# Patient Record
Sex: Female | Born: 1970 | Race: White | Hispanic: No | Marital: Married | Smoking: Never smoker
Health system: Southern US, Community
[De-identification: ages and names within clinical notes are randomized; demographics above are authoritative.]

## PROBLEM LIST (undated history)

## (undated) DIAGNOSIS — J45909 Unspecified asthma, uncomplicated: Secondary | ICD-10-CM

## (undated) DIAGNOSIS — F419 Anxiety disorder, unspecified: Secondary | ICD-10-CM

## (undated) DIAGNOSIS — T7840XA Allergy, unspecified, initial encounter: Secondary | ICD-10-CM

## (undated) DIAGNOSIS — G720 Drug-induced myopathy: Secondary | ICD-10-CM

## (undated) DIAGNOSIS — I10 Essential (primary) hypertension: Secondary | ICD-10-CM

## (undated) DIAGNOSIS — D8689 Sarcoidosis of other sites: Secondary | ICD-10-CM

## (undated) DIAGNOSIS — F32A Depression, unspecified: Secondary | ICD-10-CM

## (undated) DIAGNOSIS — M810 Age-related osteoporosis without current pathological fracture: Secondary | ICD-10-CM

## (undated) DIAGNOSIS — M1990 Unspecified osteoarthritis, unspecified site: Secondary | ICD-10-CM

## (undated) DIAGNOSIS — E039 Hypothyroidism, unspecified: Secondary | ICD-10-CM

## (undated) DIAGNOSIS — F329 Major depressive disorder, single episode, unspecified: Secondary | ICD-10-CM

## (undated) DIAGNOSIS — N189 Chronic kidney disease, unspecified: Secondary | ICD-10-CM

## (undated) DIAGNOSIS — K219 Gastro-esophageal reflux disease without esophagitis: Secondary | ICD-10-CM

## (undated) DIAGNOSIS — E034 Atrophy of thyroid (acquired): Secondary | ICD-10-CM

## (undated) DIAGNOSIS — M2550 Pain in unspecified joint: Secondary | ICD-10-CM

## (undated) DIAGNOSIS — M48061 Spinal stenosis, lumbar region without neurogenic claudication: Secondary | ICD-10-CM

## (undated) DIAGNOSIS — E782 Mixed hyperlipidemia: Secondary | ICD-10-CM

## (undated) HISTORY — DX: Atrophy of thyroid (acquired): E03.4

## (undated) HISTORY — DX: Allergy, unspecified, initial encounter: T78.40XA

## (undated) HISTORY — PX: TONSILLECTOMY: SUR1361

## (undated) HISTORY — DX: Mixed hyperlipidemia: E78.2

## (undated) HISTORY — PX: INNER EAR SURGERY: SHX679

## (undated) HISTORY — DX: Chronic kidney disease, unspecified: N18.9

## (undated) HISTORY — DX: Drug-induced myopathy: G72.0

## (undated) HISTORY — DX: Sarcoidosis of other sites: D86.89

## (undated) HISTORY — DX: Age-related osteoporosis without current pathological fracture: M81.0

## (undated) HISTORY — PX: APPENDECTOMY: SHX54

## (undated) HISTORY — DX: Essential (primary) hypertension: I10

## (undated) HISTORY — DX: Gastro-esophageal reflux disease without esophagitis: K21.9

## (undated) HISTORY — DX: Pain in unspecified joint: M25.50

---

## 1997-10-09 HISTORY — PX: ABDOMINAL HYSTERECTOMY: SHX81

## 2003-10-10 HISTORY — PX: BACK SURGERY: SHX140

## 2004-03-31 ENCOUNTER — Inpatient Hospital Stay (HOSPITAL_COMMUNITY): Admission: RE | Admit: 2004-03-31 | Discharge: 2004-04-03 | Payer: Self-pay | Admitting: Neurological Surgery

## 2004-04-26 ENCOUNTER — Encounter: Admission: RE | Admit: 2004-04-26 | Discharge: 2004-04-26 | Payer: Self-pay | Admitting: Neurological Surgery

## 2004-07-04 ENCOUNTER — Encounter: Admission: RE | Admit: 2004-07-04 | Discharge: 2004-07-04 | Payer: Self-pay | Admitting: Neurological Surgery

## 2004-09-05 ENCOUNTER — Encounter: Admission: RE | Admit: 2004-09-05 | Discharge: 2004-09-05 | Payer: Self-pay | Admitting: Neurological Surgery

## 2017-05-10 ENCOUNTER — Other Ambulatory Visit: Payer: Self-pay | Admitting: Physician Assistant

## 2017-05-10 DIAGNOSIS — M961 Postlaminectomy syndrome, not elsewhere classified: Secondary | ICD-10-CM

## 2017-05-21 ENCOUNTER — Ambulatory Visit
Admission: RE | Admit: 2017-05-21 | Discharge: 2017-05-21 | Disposition: A | Discharge status: Home / Self Care | Payer: PRIVATE HEALTH INSURANCE | Source: Ambulatory Visit | Attending: Physician Assistant | Admitting: Physician Assistant

## 2017-05-21 DIAGNOSIS — M961 Postlaminectomy syndrome, not elsewhere classified: Secondary | ICD-10-CM

## 2017-05-21 MED ORDER — ONDANSETRON HCL 4 MG/2ML IJ SOLN
4.0000 mg | Freq: Once | INTRAMUSCULAR | Status: AC
  Administered 2017-05-21: 4 mg via INTRAMUSCULAR

## 2017-05-21 MED ORDER — IOPAMIDOL (ISOVUE-M 200) INJECTION 41%
15.0000 mL | Freq: Once | INTRAMUSCULAR | Status: AC
  Administered 2017-05-21: 15 mL via INTRATHECAL

## 2017-05-21 MED ORDER — MEPERIDINE HCL 100 MG/ML IJ SOLN
75.0000 mg | Freq: Once | INTRAMUSCULAR | Status: AC
  Administered 2017-05-21: 75 mg via INTRAMUSCULAR

## 2017-05-21 MED ORDER — ONDANSETRON HCL 4 MG/2ML IJ SOLN: 4.0000 mg | Freq: Four times a day (QID) | INTRAMUSCULAR | Status: DC | PRN

## 2017-05-21 MED ORDER — DIAZEPAM 5 MG PO TABS
10.0000 mg | ORAL_TABLET | Freq: Once | ORAL | Status: AC
  Administered 2017-05-21: 10 mg via ORAL

## 2017-05-21 NOTE — Progress Notes (Signed)
Patient states she has been off Zoloft for at least the past two days.  Kortland Nichols, RN 

## 2017-05-21 NOTE — Discharge Instructions (Signed)
Myelogram Discharge Instructions  1. Go home and rest quietly for the next 24 hours.  It is important to lie flat for the next 24 hours.  Get up only to go to the restroom.  You may lie in the bed or on a couch on your back, your stomach, your left side or your right side.  You may have one pillow under your head.  You may have pillows between your knees while you are on your side or under your knees while you are on your back.  2. DO NOT drive today.  Recline the seat as far back as it will go, while still wearing your seat belt, on the way home.  3. You may get up to go to the bathroom as needed.  You may sit up for 10 minutes to eat.  You may resume your normal diet and medications unless otherwise indicated.  Drink lots of extra fluids today and tomorrow.  4. The incidence of headache, nausea, or vomiting is about 5% (one in 20 patients).  If you develop a headache, lie flat and drink plenty of fluids until the headache goes away.  Caffeinated beverages may be helpful.  If you develop severe nausea and vomiting or a headache that does not go away with flat bed rest, call [phone].  5. You may resume normal activities after your 24 hours of bed rest is over; however, do not exert yourself strongly or do any heavy lifting tomorrow. If when you get up you have a headache when standing, go back to bed and force fluids for another 24 hours.  6. Call your physician for a follow-up appointment.  The results of your myelogram will be sent directly to your physician by the following day.  7. If you have any questions or if complications develop after you arrive home, please call [phone].  Discharge instructions have been explained to the patient.  The patient, or the person responsible for the patient, fully understands these instructions.        May resume Zoloft on Aug. 14, 2018, after 9:30 am.

## 2017-05-23 ENCOUNTER — Telehealth: Payer: Self-pay | Admitting: Radiology

## 2017-05-23 NOTE — Telephone Encounter (Signed)
Addendum 05/23/2017 10:15  Dr. Maree Erie made aware of conversation with Mr. Riesen. He concurred that cardiac needed to be ruled out.

## 2017-05-23 NOTE — Telephone Encounter (Signed)
Pt has been cleared for chest pain and shortness of breath by [place] is still having positional headache. Will call office for blood patch order today.

## 2017-05-23 NOTE — Telephone Encounter (Signed)
Pt's husband had called yesterday and spoke to [place]. Asked if  some symptoms his wife was having were related to post myelogram. C/0 chest pain and upper back pain, with an episode of chest pain last week.  Pt's husband called today to say the pt was having jaw pain and upper back pain. Explained this could be signs of cardiac issue and told him to contact her physician. Also, c/o headache this morning, told him this could be from the myelo but the other symptoms needed to be sorted out before address this issue.

## 2017-05-24 ENCOUNTER — Telehealth: Payer: Self-pay | Admitting: Radiology

## 2017-05-24 ENCOUNTER — Other Ambulatory Visit: Payer: Self-pay | Admitting: Physician Assistant

## 2017-05-24 DIAGNOSIS — G971 Other reaction to spinal and lumbar puncture: Secondary | ICD-10-CM

## 2017-05-24 NOTE — Telephone Encounter (Signed)
Pt's husband called and even with repeated.bedrest still has a positional headache. [facility] again for order. Assistant will send it high priority to clinic.

## 2017-05-25 ENCOUNTER — Ambulatory Visit
Admission: RE | Admit: 2017-05-25 | Discharge: 2017-05-25 | Disposition: A | Discharge status: Home / Self Care | Payer: PRIVATE HEALTH INSURANCE | Source: Ambulatory Visit | Attending: Physician Assistant | Admitting: Physician Assistant

## 2017-05-25 VITALS — BP 142/84 | HR 70

## 2017-05-25 DIAGNOSIS — G971 Other reaction to spinal and lumbar puncture: Secondary | ICD-10-CM

## 2017-05-25 MED ORDER — HYDROCODONE-ACETAMINOPHEN 5-325 MG PO TABS
1.0000 | ORAL_TABLET | Freq: Once | ORAL | Status: AC
  Administered 2017-05-25: 1 via ORAL

## 2017-05-25 MED ORDER — IOPAMIDOL (ISOVUE-M 200) INJECTION 41%
1.0000 mL | Freq: Once | INTRAMUSCULAR | Status: AC
  Administered 2017-05-25: 1 mL via EPIDURAL

## 2017-05-25 NOTE — Progress Notes (Signed)
20cc blood drawn from right AC space for Epidural Blood Patch by Cristal Ford, RN; site unremarkable. Brita Romp, RN

## 2017-05-25 NOTE — Discharge Instructions (Signed)

## 2017-06-05 ENCOUNTER — Ambulatory Visit: Payer: Self-pay | Admitting: Physician Assistant

## 2017-06-12 NOTE — Pre-Procedure Instructions (Signed)
Bonnie Parker  06/12/2017      [facility] [address], [place] [address] [place] [address] Phone: [phone] Fax: [phone]    Your procedure is scheduled on September 13  Report to [place] at 1050 A.M.  Call this number if you have problems the morning of surgery:  [phone]   Remember:  Do not eat food or drink liquids after midnight.  Continue all other medications as directed by your physician except follow these instructions about you medications   Take these medicines the morning of surgery with A SIP OF WATER  acetaminophen (TYLENOL) ALPRAZolam (XANAX)  HYDROcodone-acetaminophen (NORCO) levothyroxine (SYNTHROID, LEVOTHROID) sertraline (ZOLOFT) methocarbamol (ROBAXIN)  7 days prior to surgery STOP taking any Aspirin, Aleve, Naproxen, Ibuprofen, Motrin, Advil, Goody's, BC's, all herbal medications, fish oil, and all vitamins    Do not wear jewelry, make-up or nail polish.  Do not wear lotions, powders, or perfumes, or deoderant.  Do not shave 48 hours prior to surgery.    Do not bring valuables to the hospital.  [facility] is not responsible for any belongings or valuables.  Contacts, dentures or bridgework may not be worn into surgery.  Leave your suitcase in the car.  After surgery it may be brought to your room.  For patients admitted to the hospital, discharge time will be determined by your treatment team.  Patients discharged the day of surgery will not be allowed to drive home.    Special instructions:   [place]- Preparing For Surgery  Before surgery, you can play an important role. Because skin is not sterile, your skin needs to be as free of germs as possible. You can reduce the number of germs on your skin by washing with CHG (chlorahexidine gluconate) Soap before surgery.  CHG is an antiseptic cleaner which kills germs and  bonds with the skin to continue killing germs even after washing.  Please do not use if you have an allergy to CHG or antibacterial soaps. If your skin becomes reddened/irritated stop using the CHG.  Do not shave (including legs and underarms) for at least 48 hours prior to first CHG shower. It is OK to shave your face.  Please follow these instructions carefully.   1. Shower the NIGHT BEFORE SURGERY and the MORNING OF SURGERY with CHG.   2. If you chose to wash your hair, wash your hair first as usual with your normal shampoo.  3. After you shampoo, rinse your hair and body thoroughly to remove the shampoo.  4. Use CHG as you would any other liquid soap. You can apply CHG directly to the skin and wash gently with a scrungie or a clean washcloth.   5. Apply the CHG Soap to your body ONLY FROM THE NECK DOWN.  Do not use on open wounds or open sores. Avoid contact with your eyes, ears, mouth and genitals (private parts). Wash genitals (private parts) with your normal soap.  6. Wash thoroughly, paying special attention to the area where your surgery will be performed.  7. Thoroughly rinse your body with warm water from the neck down.  8. DO NOT shower/wash with your normal soap after using and rinsing off the CHG Soap.  9. Pat yourself dry with a CLEAN TOWEL.   10. Wear CLEAN PAJAMAS   11. Place CLEAN SHEETS on your bed the night of  your first shower and DO NOT SLEEP WITH PETS.    Day of Surgery: Do not apply any deodorants/lotions. Please wear clean clothes to the hospital/surgery center.      Please read over the following fact sheets that you were given.

## 2017-06-13 ENCOUNTER — Encounter (HOSPITAL_COMMUNITY)
Admission: RE | Admit: 2017-06-13 | Discharge: 2017-06-13 | Disposition: A | Discharge status: Home / Self Care | Payer: PRIVATE HEALTH INSURANCE | Source: Ambulatory Visit | Attending: Orthopedic Surgery | Admitting: Orthopedic Surgery

## 2017-06-13 ENCOUNTER — Encounter (HOSPITAL_COMMUNITY): Payer: Self-pay

## 2017-06-13 DIAGNOSIS — M48061 Spinal stenosis, lumbar region without neurogenic claudication: Secondary | ICD-10-CM | POA: Insufficient documentation

## 2017-06-13 HISTORY — DX: Hypothyroidism, unspecified: E03.9

## 2017-06-13 HISTORY — DX: Major depressive disorder, single episode, unspecified: F32.9

## 2017-06-13 HISTORY — DX: Unspecified asthma, uncomplicated: J45.909

## 2017-06-13 HISTORY — DX: Anxiety disorder, unspecified: F41.9

## 2017-06-13 HISTORY — DX: Unspecified osteoarthritis, unspecified site: M19.90

## 2017-06-13 HISTORY — DX: Depression, unspecified: F32.A

## 2017-06-13 LAB — SURGICAL PCR SCREEN
MRSA, PCR: NEGATIVE
STAPHYLOCOCCUS AUREUS: NEGATIVE

## 2017-06-13 LAB — CBC
HEMATOCRIT: 41.9 % (ref 36.0–46.0)
HEMOGLOBIN: 13.6 g/dL (ref 12.0–15.0)
MCH: 28.7 pg (ref 26.0–34.0)
MCHC: 32.5 g/dL (ref 30.0–36.0)
MCV: 88.4 fL (ref 78.0–100.0)
Platelets: 322 10*3/uL (ref 150–400)
RBC: 4.74 MIL/uL (ref 3.87–5.11)
RDW: 14.1 % (ref 11.5–15.5)
WBC: 7 10*3/uL (ref 4.0–10.5)

## 2017-06-13 LAB — BASIC METABOLIC PANEL
ANION GAP: 10 (ref 5–15)
BUN: 10 mg/dL (ref 6–20)
CO2: 24 mmol/L (ref 22–32)
Calcium: 9.4 mg/dL (ref 8.9–10.3)
Chloride: 103 mmol/L (ref 101–111)
Creatinine, Ser: 0.82 mg/dL (ref 0.44–1.00)
GFR calc Af Amer: >60 mL/min (ref >60)
Glucose, Bld: 77 mg/dL (ref 65–99)
POTASSIUM: 4.2 mmol/L (ref 3.5–5.1)
SODIUM: 137 mmol/L (ref 135–145)

## 2017-06-13 NOTE — Progress Notes (Signed)
PCP: Oren Beckmann PA-C  Cardiologist: pt denies  EKG: 05/2017 -[facility]-requested  Stress test: 6 months ago at [facility] hospital-requested  ECHO: pt denies  Cardiac Cath: pt denies  Chest x-ray: pt denies past year

## 2017-06-14 NOTE — Progress Notes (Signed)
Anesthesia Chart Review:  Pt is a 46 year old female scheduled for L3-4 lumbar decompression on 06/21/2017 with Melina Schools, MD  - PCP is Darrol Jump, PA at [facility] in [place] who cleared pt for surgery at last office visit 06/04/17  PMH includes:  Asthma, hypothyroidism. Never smoker. BMI 32  Medications include: Lipitor, levothyroxine  Preoperative labs reviewed.    EKG 05/23/17 [facility]): NSR  Exercise treadmill stress test 01/26/17 ([place]): 1. Treadmill stress test negative for evidence of inducible ischemia. 2. Fair exercise capacity.  If no changes, I anticipate pt can proceed with surgery as scheduled.   Willeen Cass, FNP-BC [facility]/Anesthesiology Phone: [phone] 06/14/2017 2:43 PM

## 2017-06-21 ENCOUNTER — Ambulatory Visit (HOSPITAL_COMMUNITY): Payer: PRIVATE HEALTH INSURANCE | Admitting: Emergency Medicine

## 2017-06-21 ENCOUNTER — Encounter (HOSPITAL_COMMUNITY)
Admission: AD | Disposition: A | Discharge status: Home / Self Care | Payer: Self-pay | Source: Ambulatory Visit | Attending: Orthopedic Surgery

## 2017-06-21 ENCOUNTER — Encounter (HOSPITAL_COMMUNITY): Payer: Self-pay

## 2017-06-21 ENCOUNTER — Ambulatory Visit (HOSPITAL_COMMUNITY): Payer: PRIVATE HEALTH INSURANCE

## 2017-06-21 ENCOUNTER — Ambulatory Visit (HOSPITAL_COMMUNITY): Payer: PRIVATE HEALTH INSURANCE | Admitting: Anesthesiology

## 2017-06-21 ENCOUNTER — Observation Stay (HOSPITAL_COMMUNITY)
Admission: AD | Admit: 2017-06-21 | Discharge: 2017-06-22 | DRG: 517 | Disposition: A | Discharge status: Home / Self Care | Payer: PRIVATE HEALTH INSURANCE | Source: Ambulatory Visit | Attending: Orthopedic Surgery | Admitting: Orthopedic Surgery

## 2017-06-21 DIAGNOSIS — M48062 Spinal stenosis, lumbar region with neurogenic claudication: Secondary | ICD-10-CM | POA: Diagnosis not present

## 2017-06-21 DIAGNOSIS — M1990 Unspecified osteoarthritis, unspecified site: Secondary | ICD-10-CM | POA: Diagnosis not present

## 2017-06-21 DIAGNOSIS — Z981 Arthrodesis status: Secondary | ICD-10-CM | POA: Diagnosis not present

## 2017-06-21 DIAGNOSIS — Z885 Allergy status to narcotic agent status: Secondary | ICD-10-CM | POA: Insufficient documentation

## 2017-06-21 DIAGNOSIS — J45909 Unspecified asthma, uncomplicated: Secondary | ICD-10-CM | POA: Insufficient documentation

## 2017-06-21 DIAGNOSIS — E039 Hypothyroidism, unspecified: Secondary | ICD-10-CM | POA: Insufficient documentation

## 2017-06-21 DIAGNOSIS — Z79899 Other long term (current) drug therapy: Secondary | ICD-10-CM | POA: Insufficient documentation

## 2017-06-21 DIAGNOSIS — Z419 Encounter for procedure for purposes other than remedying health state, unspecified: Secondary | ICD-10-CM

## 2017-06-21 DIAGNOSIS — F329 Major depressive disorder, single episode, unspecified: Secondary | ICD-10-CM | POA: Diagnosis not present

## 2017-06-21 DIAGNOSIS — M5116 Intervertebral disc disorders with radiculopathy, lumbar region: Principal | ICD-10-CM | POA: Insufficient documentation

## 2017-06-21 DIAGNOSIS — Z79891 Long term (current) use of opiate analgesic: Secondary | ICD-10-CM | POA: Diagnosis not present

## 2017-06-21 DIAGNOSIS — M4800 Spinal stenosis, site unspecified: Secondary | ICD-10-CM | POA: Diagnosis present

## 2017-06-21 DIAGNOSIS — Z91048 Other nonmedicinal substance allergy status: Secondary | ICD-10-CM | POA: Insufficient documentation

## 2017-06-21 DIAGNOSIS — F419 Anxiety disorder, unspecified: Secondary | ICD-10-CM | POA: Insufficient documentation

## 2017-06-21 HISTORY — PX: LUMBAR LAMINECTOMY/DECOMPRESSION MICRODISCECTOMY: SHX5026

## 2017-06-21 HISTORY — DX: Spinal stenosis, lumbar region without neurogenic claudication: M48.061

## 2017-06-21 SURGERY — LUMBAR LAMINECTOMY/DECOMPRESSION MICRODISCECTOMY
Anesthesia: General | Site: Back

## 2017-06-21 MED ORDER — OXYCODONE-ACETAMINOPHEN 10-325 MG PO TABS: 1.0000 | ORAL_TABLET | ORAL | 0 refills | Status: DC | PRN

## 2017-06-21 MED ORDER — CEFAZOLIN SODIUM-DEXTROSE 2-4 GM/100ML-% IV SOLN
2.0000 g | Freq: Three times a day (TID) | INTRAVENOUS | Status: AC
  Administered 2017-06-21 – 2017-06-22 (×2): 2 g via INTRAVENOUS
  Filled 2017-06-21 (×2): qty 100

## 2017-06-21 MED ORDER — ACETAMINOPHEN 10 MG/ML IV SOLN
1000.0000 mg | Freq: Once | INTRAVENOUS | Status: AC
  Administered 2017-06-21: 1000 mg via INTRAVENOUS
  Filled 2017-06-21: qty 100

## 2017-06-21 MED ORDER — BUPIVACAINE-EPINEPHRINE 0.25% -1:200000 IJ SOLN
INTRAMUSCULAR | Status: DC | PRN
  Administered 2017-06-21: 10 mL

## 2017-06-21 MED ORDER — HYDROMORPHONE HCL 1 MG/ML IJ SOLN
INTRAMUSCULAR | Status: AC
  Administered 2017-06-21: 0.5 mg via INTRAVENOUS
  Filled 2017-06-21: qty 1

## 2017-06-21 MED ORDER — 0.9 % SODIUM CHLORIDE (POUR BTL) OPTIME
TOPICAL | Status: DC | PRN
  Administered 2017-06-21: 1000 mL

## 2017-06-21 MED ORDER — HEMOSTATIC AGENTS (NO CHARGE) OPTIME
TOPICAL | Status: DC | PRN
  Administered 2017-06-21: 1 via TOPICAL

## 2017-06-21 MED ORDER — ONDANSETRON HCL 4 MG PO TABS
4.0000 mg | ORAL_TABLET | Freq: Four times a day (QID) | ORAL | Status: DC | PRN
Start: 2017-06-21 — End: 2017-06-22

## 2017-06-21 MED ORDER — ACETAMINOPHEN 325 MG PO TABS
650.0000 mg | ORAL_TABLET | ORAL | Status: DC | PRN
  Administered 2017-06-21 – 2017-06-22 (×2): 650 mg via ORAL
  Filled 2017-06-21 (×2): qty 2

## 2017-06-21 MED ORDER — SUGAMMADEX SODIUM 200 MG/2ML IV SOLN
INTRAVENOUS | Status: DC | PRN
  Administered 2017-06-21: 200 mg via INTRAVENOUS

## 2017-06-21 MED ORDER — SERTRALINE HCL 100 MG PO TABS
100.0000 mg | ORAL_TABLET | Freq: Every day | ORAL | Status: DC
  Administered 2017-06-22: 100 mg via ORAL
  Filled 2017-06-21: qty 2
  Filled 2017-06-21: qty 1

## 2017-06-21 MED ORDER — LACTATED RINGERS IV SOLN
INTRAVENOUS | Status: DC
  Administered 2017-06-21 (×2): via INTRAVENOUS

## 2017-06-21 MED ORDER — PHENYLEPHRINE HCL 10 MG/ML IJ SOLN
INTRAMUSCULAR | Status: DC | PRN
  Administered 2017-06-21 (×2): 80 ug via INTRAVENOUS
  Administered 2017-06-21 (×2): 40 ug via INTRAVENOUS

## 2017-06-21 MED ORDER — PROMETHAZINE HCL 25 MG/ML IJ SOLN: 6.2500 mg | INTRAMUSCULAR | Status: DC | PRN

## 2017-06-21 MED ORDER — POLYETHYLENE GLYCOL 3350 17 G PO PACK: 17.0000 g | PACK | Freq: Every day | ORAL | Status: DC | PRN

## 2017-06-21 MED ORDER — ALPRAZOLAM 0.5 MG PO TABS: 0.5000 mg | ORAL_TABLET | Freq: Every day | ORAL | Status: DC | PRN

## 2017-06-21 MED ORDER — CEFAZOLIN SODIUM-DEXTROSE 2-4 GM/100ML-% IV SOLN
2.0000 g | INTRAVENOUS | Status: AC
  Administered 2017-06-21: 2 g via INTRAVENOUS
  Filled 2017-06-21: qty 100

## 2017-06-21 MED ORDER — METHOCARBAMOL 500 MG PO TABS
ORAL_TABLET | ORAL | Status: AC
  Administered 2017-06-21: 500 mg via ORAL
  Filled 2017-06-21: qty 1

## 2017-06-21 MED ORDER — FENTANYL CITRATE (PF) 100 MCG/2ML IJ SOLN
INTRAMUSCULAR | Status: DC | PRN
  Administered 2017-06-21 (×2): 50 ug via INTRAVENOUS
  Administered 2017-06-21: 100 ug via INTRAVENOUS

## 2017-06-21 MED ORDER — HYDROMORPHONE HCL 1 MG/ML IJ SOLN
0.2500 mg | INTRAMUSCULAR | Status: DC | PRN
  Administered 2017-06-21 (×4): 0.5 mg via INTRAVENOUS

## 2017-06-21 MED ORDER — MIDAZOLAM HCL 2 MG/2ML IJ SOLN
INTRAMUSCULAR | Status: AC
  Filled 2017-06-21: qty 2

## 2017-06-21 MED ORDER — BUPIVACAINE-EPINEPHRINE (PF) 0.25% -1:200000 IJ SOLN
INTRAMUSCULAR | Status: AC
  Filled 2017-06-21: qty 30

## 2017-06-21 MED ORDER — DEXAMETHASONE SODIUM PHOSPHATE 4 MG/ML IJ SOLN
4.0000 mg | Freq: Four times a day (QID) | INTRAMUSCULAR | Status: AC
  Administered 2017-06-21 (×2): 4 mg via INTRAVENOUS
  Filled 2017-06-21 (×2): qty 1

## 2017-06-21 MED ORDER — MIDAZOLAM HCL 5 MG/5ML IJ SOLN
INTRAMUSCULAR | Status: DC | PRN
  Administered 2017-06-21: 2 mg via INTRAVENOUS

## 2017-06-21 MED ORDER — DEXAMETHASONE SODIUM PHOSPHATE 10 MG/ML IJ SOLN
INTRAMUSCULAR | Status: DC | PRN
  Administered 2017-06-21: 10 mg via INTRAVENOUS

## 2017-06-21 MED ORDER — LEVOTHYROXINE SODIUM 50 MCG PO TABS
50.0000 ug | ORAL_TABLET | Freq: Every day | ORAL | Status: DC
  Administered 2017-06-22: 50 ug via ORAL
  Filled 2017-06-21: qty 1

## 2017-06-21 MED ORDER — LIDOCAINE 2% (20 MG/ML) 5 ML SYRINGE
INTRAMUSCULAR | Status: AC
  Filled 2017-06-21: qty 5

## 2017-06-21 MED ORDER — ONDANSETRON HCL 4 MG PO TABS: 4.0000 mg | ORAL_TABLET | Freq: Three times a day (TID) | ORAL | 0 refills | Status: DC | PRN

## 2017-06-21 MED ORDER — DEXAMETHASONE 4 MG PO TABS
4.0000 mg | ORAL_TABLET | Freq: Four times a day (QID) | ORAL | Status: AC
  Administered 2017-06-22: 4 mg via ORAL
  Filled 2017-06-21: qty 1

## 2017-06-21 MED ORDER — MORPHINE SULFATE (PF) 4 MG/ML IV SOLN
2.0000 mg | INTRAVENOUS | Status: DC | PRN
  Administered 2017-06-21: 2 mg via INTRAVENOUS
  Filled 2017-06-21: qty 1

## 2017-06-21 MED ORDER — METHOCARBAMOL 500 MG PO TABS: 500.0000 mg | ORAL_TABLET | Freq: Three times a day (TID) | ORAL | 0 refills | Status: DC | PRN

## 2017-06-21 MED ORDER — THROMBIN 20000 UNITS EX SOLR
CUTANEOUS | Status: AC
  Filled 2017-06-21: qty 20000

## 2017-06-21 MED ORDER — LACTATED RINGERS IV SOLN: INTRAVENOUS | Status: DC

## 2017-06-21 MED ORDER — ACETAMINOPHEN 650 MG RE SUPP: 650.0000 mg | RECTAL | Status: DC | PRN

## 2017-06-21 MED ORDER — METHOCARBAMOL 1000 MG/10ML IJ SOLN
500.0000 mg | Freq: Four times a day (QID) | INTRAVENOUS | Status: DC | PRN
  Filled 2017-06-21: qty 5

## 2017-06-21 MED ORDER — ONDANSETRON HCL 4 MG/2ML IJ SOLN
INTRAMUSCULAR | Status: DC | PRN
  Administered 2017-06-21: 4 mg via INTRAVENOUS

## 2017-06-21 MED ORDER — ONDANSETRON HCL 4 MG/2ML IJ SOLN
INTRAMUSCULAR | Status: AC
  Filled 2017-06-21: qty 2

## 2017-06-21 MED ORDER — MENTHOL 3 MG MT LOZG: 1.0000 | LOZENGE | OROMUCOSAL | Status: DC | PRN

## 2017-06-21 MED ORDER — OXYCODONE HCL 5 MG PO TABS
10.0000 mg | ORAL_TABLET | ORAL | Status: DC | PRN
  Administered 2017-06-21 – 2017-06-22 (×5): 10 mg via ORAL
  Filled 2017-06-21 (×4): qty 2

## 2017-06-21 MED ORDER — ROCURONIUM BROMIDE 100 MG/10ML IV SOLN
INTRAVENOUS | Status: DC | PRN
  Administered 2017-06-21: 10 mg via INTRAVENOUS
  Administered 2017-06-21: 40 mg via INTRAVENOUS

## 2017-06-21 MED ORDER — SODIUM CHLORIDE 0.9% FLUSH: 3.0000 mL | INTRAVENOUS | Status: DC | PRN

## 2017-06-21 MED ORDER — LIDOCAINE HCL (CARDIAC) 20 MG/ML IV SOLN
INTRAVENOUS | Status: DC | PRN
  Administered 2017-06-21: 60 mg via INTRAVENOUS

## 2017-06-21 MED ORDER — MEPERIDINE HCL 25 MG/ML IJ SOLN: 6.2500 mg | INTRAMUSCULAR | Status: DC | PRN

## 2017-06-21 MED ORDER — SODIUM CHLORIDE 0.9% FLUSH: 3.0000 mL | Freq: Two times a day (BID) | INTRAVENOUS | Status: DC

## 2017-06-21 MED ORDER — METHOCARBAMOL 500 MG PO TABS
500.0000 mg | ORAL_TABLET | Freq: Four times a day (QID) | ORAL | Status: DC | PRN
  Administered 2017-06-21 – 2017-06-22 (×3): 500 mg via ORAL
  Filled 2017-06-21 (×2): qty 1

## 2017-06-21 MED ORDER — SUGAMMADEX SODIUM 200 MG/2ML IV SOLN
INTRAVENOUS | Status: AC
  Filled 2017-06-21: qty 2

## 2017-06-21 MED ORDER — DEXAMETHASONE SODIUM PHOSPHATE 10 MG/ML IJ SOLN
INTRAMUSCULAR | Status: AC
  Filled 2017-06-21: qty 1

## 2017-06-21 MED ORDER — ATORVASTATIN CALCIUM 80 MG PO TABS
80.0000 mg | ORAL_TABLET | Freq: Every day | ORAL | Status: DC
  Filled 2017-06-21 (×2): qty 1

## 2017-06-21 MED ORDER — PROPOFOL 10 MG/ML IV BOLUS
INTRAVENOUS | Status: DC | PRN
  Administered 2017-06-21: 130 mg via INTRAVENOUS

## 2017-06-21 MED ORDER — PROPOFOL 10 MG/ML IV BOLUS
INTRAVENOUS | Status: AC
  Filled 2017-06-21: qty 20

## 2017-06-21 MED ORDER — ONDANSETRON HCL 4 MG/2ML IJ SOLN: 4.0000 mg | Freq: Four times a day (QID) | INTRAMUSCULAR | Status: DC | PRN

## 2017-06-21 MED ORDER — OXYCODONE HCL 5 MG PO TABS
ORAL_TABLET | ORAL | Status: AC
  Administered 2017-06-21: 10 mg via ORAL
  Filled 2017-06-21: qty 2

## 2017-06-21 MED ORDER — PHENOL 1.4 % MT LIQD
1.0000 | OROMUCOSAL | Status: DC | PRN
  Filled 2017-06-21: qty 177

## 2017-06-21 MED ORDER — FENTANYL CITRATE (PF) 250 MCG/5ML IJ SOLN
INTRAMUSCULAR | Status: AC
  Filled 2017-06-21: qty 5

## 2017-06-21 SURGICAL SUPPLY — 57 items
AGENT HMST SPONGE THK3/8 (HEMOSTASIS) ×1
BNDG GAUZE ELAST 4 BULKY (GAUZE/BANDAGES/DRESSINGS) ×3 IMPLANT
CANISTER SUCT 3000ML PPV (MISCELLANEOUS) ×3 IMPLANT
CLOSURE STERI-STRIP 1/2X4 (GAUZE/BANDAGES/DRESSINGS) ×1
CLSR STERI-STRIP ANTIMIC 1/2X4 (GAUZE/BANDAGES/DRESSINGS) ×2 IMPLANT
COVER SURGICAL LIGHT HANDLE (MISCELLANEOUS) ×3 IMPLANT
DRAIN CHANNEL 15F RND FF W/TCR (WOUND CARE) ×2 IMPLANT
DRAPE SURG 17X23 STRL (DRAPES) ×9 IMPLANT
DRAPE U-SHAPE 47X51 STRL (DRAPES) ×3 IMPLANT
DRSG AQUACEL AG ADV 3.5X 6 (GAUZE/BANDAGES/DRESSINGS) ×3 IMPLANT
DRSG OPSITE POSTOP 4X6 (GAUZE/BANDAGES/DRESSINGS) ×3 IMPLANT
DRSG TEGADERM 4X4.75 (GAUZE/BANDAGES/DRESSINGS) ×3 IMPLANT
DURAPREP 26ML APPLICATOR (WOUND CARE) ×3 IMPLANT
ELECT BLADE 4.0 EZ CLEAN MEGAD (MISCELLANEOUS) ×3
ELECT PENCIL ROCKER SW 15FT (MISCELLANEOUS) ×3 IMPLANT
ELECT REM PT RETURN 9FT ADLT (ELECTROSURGICAL) ×3
ELECTRODE BLDE 4.0 EZ CLN MEGD (MISCELLANEOUS) ×1 IMPLANT
ELECTRODE REM PT RTRN 9FT ADLT (ELECTROSURGICAL) ×1 IMPLANT
EVACUATOR SILICONE 100CC (DRAIN) ×3 IMPLANT
GAUZE SPONGE 4X4 12PLY STRL (GAUZE/BANDAGES/DRESSINGS) ×3 IMPLANT
GLOVE BIO SURGEON STRL SZ 6.5 (GLOVE) ×2 IMPLANT
GLOVE BIO SURGEONS STRL SZ 6.5 (GLOVE) ×1
GLOVE BIOGEL PI IND STRL 6.5 (GLOVE) ×1 IMPLANT
GLOVE BIOGEL PI IND STRL 8.5 (GLOVE) ×1 IMPLANT
GLOVE BIOGEL PI INDICATOR 6.5 (GLOVE) ×2
GLOVE BIOGEL PI INDICATOR 8.5 (GLOVE) ×2
GLOVE SS BIOGEL STRL SZ 8.5 (GLOVE) ×1 IMPLANT
GLOVE SUPERSENSE BIOGEL SZ 8.5 (GLOVE) ×2
GOWN STRL REUS W/ TWL XL LVL3 (GOWN DISPOSABLE) ×2 IMPLANT
GOWN STRL REUS W/TWL 2XL LVL3 (GOWN DISPOSABLE) ×3 IMPLANT
GOWN STRL REUS W/TWL XL LVL3 (GOWN DISPOSABLE) ×6
HEMOSTAT SPONGE AVITENE ULTRA (HEMOSTASIS) ×3 IMPLANT
KIT BASIN OR (CUSTOM PROCEDURE TRAY) ×3 IMPLANT
KIT ROOM TURNOVER OR (KITS) ×3 IMPLANT
NEEDLE 22X1 1/2 (OR ONLY) (NEEDLE) ×3 IMPLANT
NEEDLE SPNL 18GX3.5 QUINCKE PK (NEEDLE) ×6 IMPLANT
NS IRRIG 1000ML POUR BTL (IV SOLUTION) ×3 IMPLANT
PACK LAMINECTOMY ORTHO (CUSTOM PROCEDURE TRAY) ×3 IMPLANT
PACK UNIVERSAL I (CUSTOM PROCEDURE TRAY) ×3 IMPLANT
PAD ARMBOARD 7.5X6 YLW CONV (MISCELLANEOUS) ×6 IMPLANT
PATTIES SURGICAL .5 X.5 (GAUZE/BANDAGES/DRESSINGS) IMPLANT
PATTIES SURGICAL .5 X1 (DISPOSABLE) ×3 IMPLANT
SPONGE SURGIFOAM ABS GEL 100 (HEMOSTASIS) ×3 IMPLANT
SURGIFLO W/THROMBIN 8M KIT (HEMOSTASIS) ×3 IMPLANT
SUT BONE WAX W31G (SUTURE) ×3 IMPLANT
SUT MON AB 3-0 SH 27 (SUTURE) ×6
SUT MON AB 3-0 SH27 (SUTURE) ×2 IMPLANT
SUT VIC AB 1 CT1 18XCR BRD 8 (SUTURE) ×1 IMPLANT
SUT VIC AB 1 CT1 27 (SUTURE) ×3
SUT VIC AB 1 CT1 27XBRD ANBCTR (SUTURE) ×1 IMPLANT
SUT VIC AB 1 CT1 8-18 (SUTURE) ×3
SUT VIC AB 2-0 CT1 18 (SUTURE) ×3 IMPLANT
SYR CONTROL 10ML LL (SYRINGE) ×3 IMPLANT
TOWEL OR 17X24 6PK STRL BLUE (TOWEL DISPOSABLE) ×3 IMPLANT
TOWEL OR 17X26 10 PK STRL BLUE (TOWEL DISPOSABLE) ×3 IMPLANT
WATER STERILE IRR 1000ML POUR (IV SOLUTION) ×3 IMPLANT
YANKAUER SUCT BULB TIP NO VENT (SUCTIONS) ×3 IMPLANT

## 2017-06-21 NOTE — Transfer of Care (Signed)
Immediate Anesthesia Transfer of Care Note  Patient: Bonnie Parker  Procedure(s) Performed: Procedure(s) with comments: Lumbar decompression L3-4  (N/A) - 3 hrs  Patient Location: PACU  Anesthesia Type:General  Level of Consciousness: oriented, drowsy and patient cooperative  Airway & Oxygen Therapy: Patient Spontanous Breathing and Patient connected to nasal cannula oxygen  Post-op Assessment: Report given to RN and Post -op Vital signs reviewed and stable  Post vital signs: Reviewed  Last Vitals:  Vitals:   06/21/17 1106  BP: (!) 142/83  Pulse: 78  Resp: 18  Temp: 36.7 C  SpO2: 98%    Last Pain:  Vitals:   06/21/17 1106  TempSrc: Oral  PainSc: 5       Patients Stated Pain Goal: 3 (62/03/55 9741)  Complications: No apparent anesthesia complications

## 2017-06-21 NOTE — Anesthesia Procedure Notes (Signed)
Procedure Name: Intubation Date/Time: 06/21/2017 1:33 PM Performed by: Jenne Campus Pre-anesthesia Checklist: Patient identified, Emergency Drugs available, Suction available and Patient being monitored Patient Re-evaluated:Patient Re-evaluated prior to induction Oxygen Delivery Method: Circle System Utilized Preoxygenation: Pre-oxygenation with 100% oxygen Induction Type: IV induction Ventilation: Mask ventilation without difficulty Laryngoscope Size: Miller and 2 Grade View: Grade I Tube type: Oral Tube size: 7.0 mm Number of attempts: 1 Airway Equipment and Method: Stylet and Oral airway Placement Confirmation: ETT inserted through vocal cords under direct vision,  positive ETCO2 and breath sounds checked- equal and bilateral Secured at: 21 cm Tube secured with: Tape Dental Injury: Teeth and Oropharynx as per pre-operative assessment

## 2017-06-21 NOTE — Anesthesia Preprocedure Evaluation (Addendum)
Anesthesia Evaluation  Patient identified by MRN, date of birth, ID band Patient awake    Reviewed: Allergy & Precautions, NPO status , Patient's Chart, lab work & pertinent test results  Airway Mallampati: I  TM Distance: >3 FB Neck ROM: Full    Dental  (+) Teeth Intact, Chipped, Missing, Dental Advisory Given, Poor Dentition,    Pulmonary asthma ,    breath sounds clear to auscultation       Cardiovascular negative cardio ROS   Rhythm:Regular Rate:Normal     Neuro/Psych PSYCHIATRIC DISORDERS Anxiety Depression negative neurological ROS     GI/Hepatic negative GI ROS, Neg liver ROS,   Endo/Other  Hypothyroidism   Renal/GU negative Renal ROS     Musculoskeletal  (+) Arthritis ,   Abdominal   Peds  Hematology negative hematology ROS (+)   Anesthesia Other Findings Day of surgery medications reviewed with the patient.  Reproductive/Obstetrics                            Anesthesia Physical Anesthesia Plan  ASA: II  Anesthesia Plan: General   Post-op Pain Management:    Induction: Intravenous  PONV Risk Score and Plan: 4 or greater and Ondansetron, Dexamethasone, Midazolam, Scopolamine patch - Pre-op and Treatment may vary due to age or medical condition  Airway Management Planned: Oral ETT  Additional Equipment:   Intra-op Plan:   Post-operative Plan: Extubation in OR  Informed Consent: I have reviewed the patients History and Physical, chart, labs and discussed the procedure including the risks, benefits and alternatives for the proposed anesthesia with the patient or authorized representative who has indicated his/her understanding and acceptance.   Dental advisory given  Plan Discussed with: CRNA  Anesthesia Plan Comments:         Anesthesia Quick Evaluation

## 2017-06-21 NOTE — Anesthesia Postprocedure Evaluation (Signed)
Anesthesia Post Note  Patient: Bonnie Parker  Procedure(s) Performed: Procedure(s) (LRB): Lumbar decompression L3-4  (N/A)     Patient location during evaluation: PACU Anesthesia Type: General Level of consciousness: awake and alert Pain management: pain level controlled Vital Signs Assessment: post-procedure vital signs reviewed and stable Respiratory status: spontaneous breathing, nonlabored ventilation, respiratory function stable and patient connected to nasal cannula oxygen Cardiovascular status: blood pressure returned to baseline and stable Postop Assessment: no apparent nausea or vomiting Anesthetic complications: no    Last Vitals:  Vitals:   06/21/17 1700 06/21/17 1715  BP:    Pulse: 97   Resp: 15 17  Temp:    SpO2: 98%     Last Pain:  Vitals:   06/21/17 1106  TempSrc: Oral  PainSc: 5                  Ryan P Ellender

## 2017-06-21 NOTE — Brief Op Note (Signed)
06/21/2017  3:32 PM  PATIENT:  Bonnie Parker  46 y.o. female  PRE-OPERATIVE DIAGNOSIS:  Lumbar spinal stenosis  POST-OPERATIVE DIAGNOSIS:  lumbar spine stenosis  PROCEDURE:  Procedure(s) with comments: Lumbar decompression L3-4  (N/A) - 3 hrs  SURGEON:  Surgeon(s) and Role:    Melina Schools, MD - Primary  PHYSICIAN ASSISTANT:   ASSISTANTS: none   ANESTHESIA:   general  EBL:  Total I/O In: 1000 [I.V.:1000] Out: 250 [Urine:100; Blood:150]  BLOOD ADMINISTERED:none  DRAINS: 1 JP drain in the back   LOCAL MEDICATIONS USED:  MARCAINE     SPECIMEN:  No Specimen  DISPOSITION OF SPECIMEN:  N/A  COUNTS:  YES  TOURNIQUET:  * No tourniquets in log *  DICTATION: .Dragon Dictation  PLAN OF CARE: Admit for overnight observation  PATIENT DISPOSITION:  PACU - hemodynamically stable.

## 2017-06-21 NOTE — Op Note (Signed)
Operative note.  Preoperative diagnosis. Lumbar spinal stenosis L3-4. Previous L4-S1 instrumented fusion and decompression.  Postoperative diagnosis. Same.  Operative note. Lumbar decompression L3-4.  Complications. None.  Indications. This is a very pleasant 46 year old young lady who has had long-standing severe back buttock and bilateral thigh pain. CT myelogram demonstrated a L3-4 significant spinal stenosis and a solid previous L4-S1 instrumented fusion. Patient's clinical exam was consistent with lumbar spinal stenosis with neurogenic claudication. After discussing treatment options she elected to proceed with surgery. All appropriate risks benefits and alternatives were discussed and consent was obtained.  Operative note. Patient was brought the operating room placed on the operating table. After successful induction of general anesthesia and endotracheal intubation teds SCDs and a Foley were inserted. Patient was then turned prone onto the Wilson frame and all bony prominences were well-padded. The back was then prepped and draped in standard fashion. Timeout was taken confirming patient procedure and all other important data.  2 needles were placed in the back and x-ray was taken for localization incision incision was marked and infiltrated with quarter percent Marcaine. Midline incision was made and sharp dissection was carried out down to the deep fascia I incised the deep fascia was able to palpate the L3 spinous process. A Cobb elevator I stripped the paraspinal muscles to expose the L3 spinous process. I then palpated with my finger inferiorly until I could feel he'll 34 facet complex and the L4 pedicle. I then gently mobilized the scar tissue that I could see the posterior aspect of the spine.  Penfield 4 was placed underneath the L3 lamina and a second x-ray was taken confirming that I was at the appropriate level. Once this was completed I then used a double-action Leksell rongeur to  remove the bulk of the L3 spinous process. Using Kerrison rongeurs I removed the lamina and performed a generous laminotomy of L3 I then dissected through the very thickened ligamentum flavum with a Penfield 4 then used my Kerrison rongeur to remove the central portion of the ligamentum flavum. I could now visualize the thecal sac. Continued inferiorly to resect the ligamentum flavum. Using my [facility] I was able to easily passed underneath the remnant of the L4 lamina. I then went into the lateral recess with Kerrison punches and decompressed into the lateral recess. I identified the L4 nerve root and traced this below the L5-3-4 disc towards the L4 foramen and made sure was completely decompressed. Once this was done on both sides I then went superiorly decompress the lateral recess until I could palpate the L3 pedicle. At this point I completely decompressed the foramen lateral recess and centrally from the L3 pedicle to the L4 pedicle. This band the area of maximum spinal stenosis seen on the preoperative CT myelogram.  I then used bipolar electrocautery to coagulate the very large epidural veins. I then used FloSeal to aid in my hemostasis. I irrigated the wound copiously normal saline and ensured there was no active bleeding I did place a drain through a separate stab incision. I then closed the wound after one last confirmation had adequate decompression using my [facility] I could palpate superiorly inferiorly medially out the L3 and L4 foramen confirming satisfactory decompression of the L3-4 level. I then closed the wound in a layered fashion over the drain with interrupted #1 Vicryl sutures, 2-0 Vicryl sutures, and 3-0 Monocryl. Steri-Strips dry dressing were applied and the patient was ultimately extubated transferred the PACU that incident. The end of  the case all needle sponge counts were correct.  No adverse intraoperative events.

## 2017-06-21 NOTE — H&P (Signed)
History of Present Illness The patient is a 46 year old female who comes in today for a preoperative History and Physical. The patient is scheduled for a Lumbar Decompression L3-4 to be performed by Dr. Duane Lope D. Rolena Infante, MD at [facility] on 06/21/17 . Pt reports a hx of good health.  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 Percocet *ANALGESICS - OPIOID*  Nausea, Itching. Allergies Reconciled   Social History  Children  4 Current drinker  04/30/2017: Currently drinks wine Current work status  unemployed Living situation  live with spouse Marital status  married Tobacco use  Never smoker. 04/30/2017  Medication History  Sertraline HCl (100MG  Tablet, Oral) Active. (qd) Methocarbamol (500MG  Tablet, Oral) Active. (Rx'd by PCP) Hydrocodone-Acetaminophen (5-325MG  Tablet, Oral) Active. (Rx'd by PCP) Atorvastatin Calcium (80MG  Tablet, Oral) Active. (qd) ALPRAZolam (0.5MG  Tablet, Oral) Active. (prn Rx'd by PCP) Orphenadrine Citrate ER (100MG  Tablet ER 12HR, Oral) Active. (bid Rx' d by PCP) Levothyroxine Sodium (50MCG Tablet, Oral) Active. (qd) Alendronate Sodium (70MG  Tablet, Oral) Active. (qd) HydroCHLOROthiazide (25MG  Tablet, Oral) Active. (prn swelling hands and feet) Ibuprofen (200MG  Capsule, Oral) Active. (prn) Vitamin D (50000U Tablet, Oral) Active. (1 q week) Medications Reconciled  Vitals  06/15/2017 1:41 PM Weight: 168 lb Height: 60.75in Body Surface Area: 1.75 m Body Mass Index: 32 kg/m  Temp.: 98.50F  Pulse: 85 (Regular)  BP: 131/85 (Sitting, Right Arm, Standard)  General General Appearance-Not in acute distress. Orientation-Oriented X3. Build & Nutrition-Well nourished and Well developed.  Integumentary General  Characteristics Surgical Scars - no surgical scar evidence of previous lumbar surgery. Lumbar Spine-Skin examination of the lumbar spine is without deformity, skin lesions, lacerations or abrasions.  Chest and Lung Exam Auscultation Breath sounds - Normal and Clear.  Cardiovascular Auscultation Rhythm - Regular rate and rhythm.  Abdomen Palpation/Percussion Palpation and Percussion of the abdomen reveal - Soft, Non Tender and No Rebound tenderness.  Peripheral Vascular Lower Extremity Palpation - Posterior tibial pulse - Bilateral - 2+. Dorsalis pedis pulse - Bilateral - 2+.  Neurologic Sensation Lower Extremity - Left - sensation is intact in the lower extremity. Right - sensation is diminished in the lower extremity. Reflexes Patellar Reflex - Bilateral - 2+. Achilles Reflex - Bilateral - 2+. Clonus - Bilateral - clonus not present. Hoffman's Sign - Bilateral - Hoffman's sign not present. Testing Seated Straight Leg Raise - Bilateral - Seated straight leg raise negative.  Musculoskeletal Spine/Ribs/Pelvis  Lumbosacral Spine: Inspection and Palpation - Tenderness - left lumbar paraspinals tender to palpation and right lumbar paraspinals tender to palpation. Strength and Tone: Strength - Hip Flexion - Bilateral - 5/5. Knee Extension - Bilateral - 5/5. Knee Flexion - Bilateral - 5/5. Ankle Dorsiflexion - Left - 5/5. Right - 4/5. Ankle Plantarflexion - Left - 5/5. Right - 4/5. Heel walk - Bilateral - able to heel walk without difficulty. Toe Walk - Bilateral - able to walk on toes without difficulty. Heel-Toe Walk - Bilateral - able to heel-toe walk without difficulty. ROM - Flexion - mildly decreased range of motion. Extension - moderately decreased range of motion and painful. Left Lateral Bending - moderately decreased range of motion and painful. Right Lateral Bending - moderately decreased range of motion and painful. Right Rotation - moderately decreased range of motion and  painful. Left Rotation - moderately decreased  range of motion and painful. Pain - neither flexion or extension is more painful than the other. Lumbosacral Spine - Waddell's Signs - no Waddell's signs present. Lower Extremity Range of Motion - No true hip, knee or ankle pain with range of motion. Gait and Station - [employer] - no assistive devices.  MRI from 2016 was reviewed. That showed mild to moderate spinal stenosis at L3-4. Her recent CT myelogram from August 2018 shows severe spinal stenosis at L3-4. She does appear to have a solid L4/5 L5/S1 fusion. there is some lucency around the S1 pedicle screws but there is no breakage there is bone graft seen and there is no subsidence of the intervertebral cage or the screw.   Assessment & Plan Goal Of Surgery: Discussed that goal of surgery is to reduce pain and improve function and quality of life. Patient is aware that despite all appropriate treatment that there pain and function could be the same, worse, or different.  Posterior Lumbar Decompression/disectomy: Risks of surgery include infection, bleeding, nerve damage, death, stroke, paralysis, failure to heal, need for further surgery, ongoing or worse pain, need for further surgery, CSF leak, loss of bowel or bladder, and recurrent disc herniation or Stenosis which would necessitate need for further surgery.  At this point time the patient has significant spinal stenosis that has progressed over the last 2 years at L3-4. Her clinical exam is consistent with spinal stenosis with neurogenic claudication. I have discussed the pathology with the patient husband and her daughter and all their questions were addressed. Repeat AP x-rays today in the office did not demonstrate a significant scoliosis. There is a mild curvature at at L3-4 but it is not significant. At this point I do not think a revision instrumented fusion is required. I do think that a straightforward lumbar decompression  would be very effective in helping to eliminate the neurogenic claudication pain that she has. Her back pain may persist but I think it will improve after this surgery. I do think that there is a component of a failed back syndrome contributing to her back pain, but I do think the spinal stenosis is the major pain source at this time. She has had a significant number of various injections none of which provided any significant relief. Given the failure of physical therapy and injection therapy, progression of spinal stenosis on imaging studies, and her clinical exam consistent with neurogenic claudication I do think a surgical decompression is warranted.

## 2017-06-22 ENCOUNTER — Encounter (HOSPITAL_COMMUNITY): Payer: Self-pay | Admitting: Orthopedic Surgery

## 2017-06-22 DIAGNOSIS — M5116 Intervertebral disc disorders with radiculopathy, lumbar region: Secondary | ICD-10-CM | POA: Diagnosis not present

## 2017-06-22 NOTE — Progress Notes (Signed)
    Subjective: Procedure(s) (LRB): Lumbar decompression L3-4  (N/A) 1 Day Post-Op  Patient reports pain as 4 on 0-10 scale.  Reports decreased leg pain reports incisional back pain   Positive void Negative bowel movement Positive flatus Negative chest pain or shortness of breath  Objective: Vital signs in last 24 hours: Temp:  [97.8 F (36.6 C)-98.4 F (36.9 C)] 98 F (36.7 C) (09/14 0347) Pulse Rate:  [78-108] 78 (09/14 0347) Resp:  [13-18] 18 (09/14 0347) BP: (120-144)/(71-88) 144/72 (09/14 0347) SpO2:  [93 %-99 %] 95 % (09/14 0347) Weight:  [76.7 kg (169 lb)] 76.7 kg (169 lb) (09/13 1106)  Intake/Output from previous day: 09/13 0701 - 09/14 0700 In: 1500 [I.V.:1500] Out: 1790 [Urine:1600; Drains:40; Blood:150]  Labs: No results for input(s): WBC, RBC, HCT, PLT in the last 72 hours. No results for input(s): NA, K, CL, CO2, BUN, CREATININE, GLUCOSE, CALCIUM in the last 72 hours. No results for input(s): LABPT, INR in the last 72 hours.  Physical Exam: Neurologically intact ABD soft Intact pulses distally Incision: dressing C/D/I Compartment soft  Assessment/Plan: Patient stable  xrays n/a Continue mobilization with physical therapy Continue care  Advance diet Up with therapy  Doing well - neurogenic claudication markedly improved D/C drain and apply new dressing - ok for d/c to home`  Melina Schools, MD [place] [phone]

## 2017-06-22 NOTE — Evaluation (Signed)
Physical Therapy Evaluation & Discharge Patient Details Name: Bonnie Parker MRN: [id number] DOB: 13-Jun-1971 Today's Date: 06/22/2017   History of Present Illness  Pt is a 46 y/o female s/p lumbar decompression at L3-L4. PMH including but not limited to chronic back pain and headaches.  Clinical Impression  Pt presented supine in bed with HOB elevated, awake and willing to participate in therapy session. Prior to admission, pt reported that she ambulated with use of SPC. Pt lives with husband and daughter who will be able to provide 24/7 supervision/assistance upon d/c. Pt ambulated in hallway with use of SPC and supervision for safety. Pt declined stair training at this time and reported that she would have assistance with them. PT provided pt with back handout and reviewed 3/3 back precautions with pt. No further acute PT needs identified at this time. PT signing off.    Follow Up Recommendations No PT follow up;Supervision/Assistance - 24 hour    Equipment Recommendations  None recommended by PT;Other (comment) (pt has all necessary DME at home)    Recommendations for Other Services       Precautions / Restrictions Precautions Precautions: Back Precaution Booklet Issued: Yes (comment) Precaution Comments: PT reviewed 3/3 back precautions with pt Required Braces or Orthoses: Spinal Brace Spinal Brace: Lumbar corset;Applied in sitting position Restrictions Weight Bearing Restrictions: No      Mobility  Bed Mobility Overal bed mobility: Needs Assistance Bed Mobility: Rolling;Sidelying to Sit Rolling: Supervision Sidelying to sit: Supervision       General bed mobility comments: supervision for safety, good log roll technique  Transfers Overall transfer level: Needs assistance Equipment used: Straight cane Transfers: Sit to/from Stand Sit to Stand: Supervision         General transfer comment: supervision for safety  Ambulation/Gait Ambulation/Gait assistance:  Supervision Ambulation Distance (Feet): 200 Feet Assistive device: Straight cane Gait Pattern/deviations: Step-through pattern;Decreased stance time - right;Decreased step length - left;Decreased stride length;Decreased weight shift to right Gait velocity: decreased Gait velocity interpretation: at or above normal speed for age/gender General Gait Details: mildly antalgic gait with use of SPC, mild instability but no overt LOB or need for physical assistance  Stairs            Wheelchair Mobility    Modified Rankin (Stroke Patients Only)       Balance Overall balance assessment: Needs assistance Sitting-balance support: Feet supported Sitting balance-Leahy Scale: Fair     Standing balance support: During functional activity;No upper extremity supported Standing balance-Leahy Scale: Fair                               Pertinent Vitals/Pain Pain Assessment: Faces Faces Pain Scale: Hurts little more Pain Location: back Pain Descriptors / Indicators: Sore Pain Intervention(s): Monitored during session;Repositioned    Home Living Family/patient expects to be discharged to:: Private residence Living Arrangements: Spouse/significant other;Children Available Help at Discharge: Family;Available 24 hours/day Type of Home: House Home Access: Stairs to enter   [employer] of Steps: 2 Home Layout: One level Home Equipment: Cane - single point;Bedside commode;Shower seat;Toilet riser;Grab bars - tub/shower      Prior Function Level of Independence: Independent with assistive device(s)         Comments: pt ambulated with use of SPC     Hand Dominance        Extremity/Trunk Assessment   Upper Extremity Assessment Upper Extremity Assessment: Defer to OT evaluation  Lower Extremity Assessment Lower Extremity Assessment: Generalized weakness;RLE deficits/detail RLE Deficits / Details: pt with reported numbness in R lateral hip     Cervical / Trunk Assessment Cervical / Trunk Assessment: Other exceptions Cervical / Trunk Exceptions: s/p lumbar sx  Communication   Communication: No difficulties  Cognition Arousal/Alertness: Awake/alert Behavior During Therapy: WFL for tasks assessed/performed Overall Cognitive Status: Within Functional Limits for tasks assessed                                        General Comments      Exercises     Assessment/Plan    PT Assessment Patent does not need any further PT services  PT Problem List         PT Treatment Interventions      PT Goals (Current goals can be found in the Care Plan section)  Acute Rehab PT Goals Patient Stated Goal: decrease pain    Frequency     Barriers to discharge        Co-evaluation               AM-PAC PT "6 Clicks" Daily Activity  Outcome Measure Difficulty turning over in bed (including adjusting bedclothes, sheets and blankets)?: A Little Difficulty moving from lying on back to sitting on the side of the bed? : A Little Difficulty sitting down on and standing up from a chair with arms (e.g., wheelchair, bedside commode, etc,.)?: A Little Help needed moving to and from a bed to chair (including a wheelchair)?: A Little Help needed walking in hospital room?: A Little Help needed climbing 3-5 steps with a railing? : A Little 6 Click Score: 18    End of Session Equipment Utilized During Treatment: Back brace Activity Tolerance: Patient tolerated treatment well Patient left: with call bell/phone within reach;with family/visitor present;Other (comment) (pt standing in room) Nurse Communication: Mobility status PT Visit Diagnosis: Other abnormalities of gait and mobility (R26.89);Pain Pain - Right/Left: Right Pain - part of body: Hip (back)    Time: 4098-1191 PT Time Calculation (min) (ACUTE ONLY): 18 min   Charges:   PT Evaluation $PT Eval Moderate Complexity: 1 Mod     PT G Codes:         [place], PT, DPT [place] 06/22/2017, 9:37 AM

## 2017-06-22 NOTE — Progress Notes (Signed)
Patient alert and oriented, mae's well, voiding adequate amount of urine, swallowing without difficulty, c/o mild pain at time of discharge. Patient discharged home with family. Script and discharged instructions given to patient. Patient and family stated understanding of instructions given. Patient has an appointment with Dr. Brooks    

## 2017-06-22 NOTE — Progress Notes (Signed)
Occupational Therapy Evaluation/Discharge Patient Details Name: Bonnie Parker MRN: [id number] DOB: 02-Jan-1971 Today's Date: 06/22/2017    History of Present Illness Pt is a 46 y/o female s/p lumbar decompression at L3-L4. PMH including but not limited to chronic back pain and headaches.   Clinical Impression   Completed all education regarding compensatory techniques for back precautions, use of AE as needed and DME. Husband present for session and pt/family verbalized understanding. Pt safe to DC home when medically stable. OT signing off.     Follow Up Recommendations  No OT follow up;Supervision - Intermittent    Equipment Recommendations  None recommended by OT    Recommendations for Other Services       Precautions / Restrictions Precautions Precautions: Back Precaution Booklet Issued: Yes (comment) Precaution Comments: Pt verbalized 3/3 back precautions Required Braces or Orthoses: Spinal Brace Spinal Brace: Lumbar corset;Applied in sitting position Restrictions Weight Bearing Restrictions: No      Mobility Bed Mobility     General bed mobility comments: OOB in chair  Transfers Overall transfer level: Needs assistance Equipment used: Straight cane Transfers: Sit to/from Stand Sit to Stand: Supervision         General transfer comment: supervision for safety    Balance Overall balance assessment: Needs assistance Sitting-balance support: Feet supported Sitting balance-Leahy Scale: Fair     Standing balance support: During functional activity;No upper extremity supported Standing balance-Leahy Scale: Fair                             ADL either performed or assessed with clinical judgement   ADL Overall ADL's : Needs assistance/impaired     Grooming: Supervision/safety   Upper Body Bathing: Set up   Lower Body Bathing: Minimal assistance;Sit to/from stand   Upper Body Dressing : Set up   Lower Body Dressing: Minimal  assistance;Sit to/from stand   Toilet Transfer: Supervision/safety;Ambulation;Comfort height toilet   Toileting- Clothing Manipulation and Hygiene: Minimal assistance;Sit to/from stand  Pt unable to reacher bottom for pericare. Educated on use of AE       Functional mobility during ADLs: Supervision/safety General ADL Comments: Completed education regarding compensatory techniques and useof AE for hygiene after toileting; pt verbalized understanding. Husband presetn for education.      Vision         Perception     Praxis      Pertinent Vitals/Pain Pain Assessment: 0-10 Pain Score: 4  Faces Pain Scale: Hurts little more Pain Location: back Pain Descriptors / Indicators: Sore Pain Intervention(s): Limited activity within patient's tolerance     Hand Dominance Right   Extremity/Trunk Assessment Upper Extremity Assessment Upper Extremity Assessment: Overall WFL for tasks assessed   Lower Extremity Assessment Lower Extremity Assessment: Defer to PT evaluation RLE Deficits / Details: pt with reported numbness in R lateral hip   Cervical / Trunk Assessment Cervical / Trunk Assessment: Other exceptions Cervical / Trunk Exceptions: s/p lumbar sx   Communication Communication Communication: No difficulties   Cognition Arousal/Alertness: Awake/alert Behavior During Therapy: WFL for tasks assessed/performed Overall Cognitive Status: Within Functional Limits for tasks assessed                                     General Comments       Exercises     Shoulder Instructions      Home Living Family/patient  expects to be discharged to:: Private residence Living Arrangements: Spouse/significant other;Children Available Help at Discharge: Family;Available 24 hours/day Type of Home: House Home Access: Stairs to enter [employer] of Steps: 2   Home Layout: One level     Bathroom Shower/Tub: Teacher, early years/pre:  Standard Bathroom Accessibility: Yes How Accessible: Accessible via walker Home Equipment: [place] - single point;Bedside commode;Shower seat;Toilet riser;Grab bars - tub/shower          Prior Functioning/Environment Level of Independence: Independent with assistive device(s)        Comments: pt ambulated with use of SPC        OT Problem List: Decreased knowledge of use of DME or AE;Decreased knowledge of precautions;Obesity;Pain      OT Treatment/Interventions:      OT Goals(Current goals can be found in the care plan section) Acute Rehab OT Goals Patient Stated Goal: decrease pain OT Goal Formulation: All assessment and education complete, DC therapy  OT Frequency:     Barriers to D/C:            Co-evaluation              AM-PAC PT "6 Clicks" Daily Activity     Outcome Measure Help from another person eating meals?: None Help from another person taking care of personal grooming?: None Help from another person toileting, which includes using toliet, bedpan, or urinal?: A Little Help from another person bathing (including washing, rinsing, drying)?: A Little Help from another person to put on and taking off regular upper body clothing?: None Help from another person to put on and taking off regular lower body clothing?: A Little 6 Click Score: 21   End of Session Equipment Utilized During Treatment: Back brace Nurse Communication: Mobility status  Activity Tolerance: Patient tolerated treatment well Patient left: in chair;with call bell/phone within reach;with family/visitor present  OT Visit Diagnosis: Muscle weakness (generalized) (M62.81);Pain Pain - part of body:  (back)                Time: 4163-8453 OT Time Calculation (min): 16 min Charges:  OT General Charges $OT Visit: 1 Visit OT Evaluation $OT Eval Low Complexity: 1 Low G-Codes:     [place], OT/L  [phone] 06/22/2017  Kamali Nephew,[place] 06/22/2017, 10:50 AM

## 2017-06-27 NOTE — Discharge Summary (Signed)
Physician Discharge Summary  Patient ID: Bonnie Parker MRN: [id number] DOB/AGE: 1971-05-22 46 y.o.  Admit date: 06/21/2017 Discharge date: 06/22/17  Admission Diagnoses:  Lumbar DDD  Discharge Diagnoses:  Active Problems:   Spinal stenosis   Past Medical History:  Diagnosis Date  . Anxiety   . Arthritis   . Asthma   . Depression   . Hypothyroidism   . Lumbar spinal stenosis     Surgeries: Procedure(s): Lumbar decompression L3-4  on 06/21/2017   Consultants (if any):   Discharged Condition: Improved  Hospital Course: Bonnie Parker is an 46 y.o. female who was admitted 06/21/2017 with a diagnosis of Lumbar DDD and went to the operating room on 06/21/2017 and underwent the above named procedures.  Post op day one pt is reporting moderate pain controlled on oral medication.  Pt reports decreased radicular leg pain.  Pt is voiding w/o difficulty. Pt is ambulating in hallway.  The pt was cleared by PT before DC.  She was given perioperative antibiotics:  Anti-infectives    Start     Dose/Rate Route Frequency Ordered Stop   06/21/17 2130  ceFAZolin (ANCEF) IVPB 2g/100 mL premix     2 g 200 mL/hr over 30 Minutes Intravenous Every 8 hours 06/21/17 1624 06/22/17 0459   06/21/17 1050  ceFAZolin (ANCEF) IVPB 2g/100 mL premix     2 g 200 mL/hr over 30 Minutes Intravenous 30 min pre-op 06/21/17 1050 06/21/17 1350    .  She was given sequential compression devices, early ambulation, and TED for DVT prophylaxis.  She benefited maximally from the hospital stay and there were no complications.    Recent vital signs:  Vitals:   06/22/17 0347 06/22/17 0819  BP: (!) 144/72 108/65  Pulse: 78 80  Resp: 18 16  Temp: 98 F (36.7 C) 98.5 F (36.9 C)  SpO2: 95% 94%    Recent laboratory studies:  Lab Results  Component Value Date   HGB 13.6 06/13/2017   Lab Results  Component Value Date   WBC 7.0 06/13/2017   PLT 322 06/13/2017   No results found for: INR Lab Results   Component Value Date   NA 137 06/13/2017   K 4.2 06/13/2017   CL 103 06/13/2017   CO2 24 06/13/2017   BUN 10 06/13/2017   CREATININE 0.82 06/13/2017   GLUCOSE 77 06/13/2017    Discharge Medications:   Allergies as of 06/22/2017      Reactions   Adhesive [tape] Other (See Comments)   blisters   Percocet [oxycodone-acetaminophen] Itching, Nausea Only      Medication List    STOP taking these medications   acetaminophen 325 MG tablet Commonly known as:  TYLENOL   HYDROcodone-acetaminophen 7.5-325 MG tablet Commonly known as:  NORCO   ibuprofen 200 MG tablet Commonly known as:  ADVIL,MOTRIN     TAKE these medications   alendronate 70 MG tablet Commonly known as:  FOSAMAX Take 70 mg by mouth every Monday. Take with a full glass of water on an empty stomach.   ALPRAZolam 0.5 MG tablet Commonly known as:  XANAX Take 0.5 mg by mouth daily as needed for anxiety.   atorvastatin 80 MG tablet Commonly known as:  LIPITOR Take 80 mg by mouth daily.   levothyroxine 50 MCG tablet Commonly known as:  SYNTHROID, LEVOTHROID Take 50 mcg by mouth daily before breakfast.   methocarbamol 500 MG tablet Commonly known as:  ROBAXIN Take 1 tablet (500 mg total) by  mouth 3 (three) times daily as needed for muscle spasms. What changed:  when to take this  reasons to take this   ondansetron 4 MG tablet Commonly known as:  ZOFRAN Take 1 tablet (4 mg total) by mouth every 8 (eight) hours as needed for nausea or vomiting.   oxyCODONE-acetaminophen 10-325 MG tablet Commonly known as:  PERCOCET Take 1 tablet by mouth every 4 (four) hours as needed for pain.   sertraline 100 MG tablet Commonly known as:  ZOLOFT Take 100 mg by mouth daily.            Discharge Care Instructions        Start     Ordered   06/21/17 0000  Incentive spirometry RT     06/21/17 1542   06/21/17 0000  oxyCODONE-acetaminophen (PERCOCET) 10-325 MG tablet  Every 4 hours PRN     06/21/17 1543    06/21/17 0000  methocarbamol (ROBAXIN) 500 MG tablet  3 times daily PRN     06/21/17 1543   06/21/17 0000  ondansetron (ZOFRAN) 4 MG tablet  Every 8 hours PRN     06/21/17 1543      Diagnostic Studies: Dg Lumbar Spine 2-3 Views  Result Date: 06/21/2017 CLINICAL DATA:  Lumbar surgery. EXAM: LUMBAR SPINE - 2-3 VIEW COMPARISON:  05/25/2017. FINDINGS: Lumbar spine numbered as per prior CT of 05/21/2017. Metallic markers noted posteriorly at the mid L3 and L4-L5 level on image number 1. On image number 2 metallic marker noted at O2-H4 level. Prior L4-L5 and L5-S1 posterior interbody fusion. IMPRESSION: Lumbar spine surgery as above. Electronically Signed   By: Marcello Moores  Register   On: 06/21/2017 14:19    Disposition: 01-Home or Self Care Pt will present to clinic in 2 weeks Post op medications were provided  Discharge Instructions    Incentive spirometry RT    Complete by:  As directed       Follow-up Information    Melina Schools, MD. Schedule an appointment as soon as possible for a visit in 2 weeks.   Specialty:  Orthopedic Surgery Why:  If symptoms worsen, For suture removal, For wound re-check Contact information: [address] [place] [address] [phone]            Signed: Valinda Hoar 06/27/2017, 8:33 AM

## 2017-12-10 DIAGNOSIS — M961 Postlaminectomy syndrome, not elsewhere classified: Secondary | ICD-10-CM | POA: Insufficient documentation

## 2017-12-10 DIAGNOSIS — T819XXA Unspecified complication of procedure, initial encounter: Secondary | ICD-10-CM | POA: Insufficient documentation

## 2017-12-19 ENCOUNTER — Telehealth: Payer: Self-pay | Admitting: Hematology

## 2017-12-19 ENCOUNTER — Encounter: Payer: Self-pay | Admitting: Hematology

## 2017-12-19 NOTE — Telephone Encounter (Signed)
Appt has been scheduled for the pt to see Dr. Irene Limbo on 3/19 at 11am. Pt aware to arrive 30 minutes early. Address verified. Letter mailed.

## 2017-12-24 DIAGNOSIS — Z79891 Long term (current) use of opiate analgesic: Secondary | ICD-10-CM | POA: Insufficient documentation

## 2017-12-24 DIAGNOSIS — R200 Anesthesia of skin: Secondary | ICD-10-CM | POA: Insufficient documentation

## 2017-12-24 DIAGNOSIS — M545 Low back pain, unspecified: Secondary | ICD-10-CM | POA: Insufficient documentation

## 2017-12-25 ENCOUNTER — Inpatient Hospital Stay: Payer: BLUE CROSS/BLUE SHIELD | Attending: Hematology | Admitting: Hematology

## 2017-12-25 ENCOUNTER — Other Ambulatory Visit: Payer: Self-pay

## 2017-12-25 ENCOUNTER — Inpatient Hospital Stay: Payer: BLUE CROSS/BLUE SHIELD

## 2017-12-25 ENCOUNTER — Encounter: Payer: Self-pay | Admitting: Hematology

## 2017-12-25 VITALS — BP 141/88 | HR 88 | Temp 98.7°F | Resp 20 | Ht 61.0 in | Wt 167.4 lb

## 2017-12-25 DIAGNOSIS — R599 Enlarged lymph nodes, unspecified: Secondary | ICD-10-CM

## 2017-12-25 DIAGNOSIS — R590 Localized enlarged lymph nodes: Secondary | ICD-10-CM | POA: Diagnosis present

## 2017-12-25 DIAGNOSIS — M810 Age-related osteoporosis without current pathological fracture: Secondary | ICD-10-CM | POA: Insufficient documentation

## 2017-12-25 DIAGNOSIS — E039 Hypothyroidism, unspecified: Secondary | ICD-10-CM | POA: Diagnosis not present

## 2017-12-25 DIAGNOSIS — K5900 Constipation, unspecified: Secondary | ICD-10-CM | POA: Insufficient documentation

## 2017-12-25 DIAGNOSIS — M549 Dorsalgia, unspecified: Secondary | ICD-10-CM | POA: Diagnosis not present

## 2017-12-25 DIAGNOSIS — G8929 Other chronic pain: Secondary | ICD-10-CM

## 2017-12-25 DIAGNOSIS — R0789 Other chest pain: Secondary | ICD-10-CM

## 2017-12-25 LAB — CMP (CANCER CENTER ONLY)
ALBUMIN: 4.1 g/dL (ref 3.5–5.0)
ALT: 19 U/L (ref 0–55)
AST: 16 U/L (ref 5–34)
Alkaline Phosphatase: 116 U/L (ref 40–150)
Anion gap: 9 (ref 3–11)
BUN: 8 mg/dL (ref 7–26)
CHLORIDE: 105 mmol/L (ref 98–109)
CO2: 26 mmol/L (ref 22–29)
CREATININE: 0.87 mg/dL (ref 0.60–1.10)
Calcium: 9.8 mg/dL (ref 8.4–10.4)
GFR, Est AFR Am: >60 mL/min (ref >60)
GLUCOSE: 93 mg/dL (ref 70–140)
POTASSIUM: 3.8 mmol/L (ref 3.5–5.1)
Sodium: 140 mmol/L (ref 136–145)
Total Bilirubin: 0.3 mg/dL (ref 0.2–1.2)
Total Protein: 7.8 g/dL (ref 6.4–8.3)

## 2017-12-25 LAB — CBC WITH DIFFERENTIAL/PLATELET
Basophils Absolute: 0 10*3/uL (ref 0.0–0.1)
Basophils Relative: 1 %
EOS ABS: 0.4 10*3/uL (ref 0.0–0.5)
EOS PCT: 6 %
HCT: 39.3 % (ref 34.8–46.6)
Hemoglobin: 12.8 g/dL (ref 11.6–15.9)
LYMPHS ABS: 1.8 10*3/uL (ref 0.9–3.3)
LYMPHS PCT: 29 %
MCH: 29 pg (ref 25.1–34.0)
MCHC: 32.6 g/dL (ref 31.5–36.0)
MCV: 89.1 fL (ref 79.5–101.0)
Monocytes Absolute: 0.5 10*3/uL (ref 0.1–0.9)
Monocytes Relative: 8 %
Neutro Abs: 3.4 10*3/uL (ref 1.5–6.5)
Neutrophils Relative %: 56 %
PLATELETS: 281 10*3/uL (ref 145–400)
RBC: 4.41 MIL/uL (ref 3.70–5.45)
RDW: 14.3 % (ref 11.2–14.5)
WBC: 6.1 10*3/uL (ref 3.9–10.3)

## 2017-12-25 LAB — URINALYSIS, COMPLETE (UACMP) WITH MICROSCOPIC
Bacteria, UA: NONE SEEN
Bilirubin Urine: NEGATIVE
GLUCOSE, UA: NEGATIVE mg/dL
Hgb urine dipstick: NEGATIVE
KETONES UR: NEGATIVE mg/dL
Leukocytes, UA: NEGATIVE
Nitrite: NEGATIVE
PROTEIN: NEGATIVE mg/dL
Specific Gravity, Urine: 1.018 (ref 1.005–1.030)
pH: 6 (ref 5.0–8.0)

## 2017-12-25 LAB — RETICULOCYTES
RBC.: 4.41 MIL/uL (ref 3.70–5.45)
RETIC CT PCT: 1.6 % (ref 0.7–2.1)
Retic Count, Absolute: 70.6 10*3/uL (ref 33.7–90.7)

## 2017-12-25 LAB — LACTATE DEHYDROGENASE: LDH: 207 U/L (ref 125–245)

## 2017-12-25 NOTE — Patient Instructions (Signed)
Thank you for choosing [facility] to provide your oncology and hematology care.  To afford each patient quality time with our providers, please arrive 30 minutes before your scheduled appointment time.  If you arrive late for your appointment, you may be asked to reschedule.  We strive to give you quality time with our providers, and arriving late affects you and other patients whose appointments are after yours.   If you are a no show for multiple scheduled visits, you may be dismissed from the clinic at the providers discretion.    Again, thank you for choosing [facility], our hope is that these requests will decrease the amount of time that you wait before being seen by our physicians.  ______________________________________________________________________  Should you have questions after your visit to the [facility], please contact our office at [phone] between the hours of 8:30 and 4:30 p.m.    Voicemails left after 4:30p.m will not be returned until the following business day.    For prescription refill requests, please have your pharmacy contact us directly.  Please also try to allow 48 hours for prescription requests.    Please contact the scheduling department for questions regarding scheduling.  For scheduling of procedures such as PET scans, CT scans, MRI, Ultrasound, etc please contact central scheduling at [phone].    Resources For Cancer Patients and Caregivers:   Oncolink.org:  A wonderful resource for patients and healthcare providers for information regarding your disease, ways to tract your treatment, what to expect, etc.     American Cancer Society:  800-227-2345  Can help patients locate various types of support and financial assistance  Cancer Care: 1-800-813-HOPE (4673) Provides financial assistance, online support groups, medication/co-pay assistance.    [place] DSS:  336-641-3447 Where to apply for food  stamps, Medicaid, and utility assistance  Medicare Rights Center: 800-333-4114 Helps people with Medicare understand their rights and benefits, navigate the Medicare system, and secure the quality healthcare they deserve  SCAT: 336-333-6589 [place] [employer]'s shared-ride transportation service for eligible riders who have a disability that prevents them from riding the fixed route bus.    For additional information on assistance programs please contact our social worker:   Grier Hock/Abigail Elmore:  [phone]            

## 2017-12-25 NOTE — Progress Notes (Signed)
HEMATOLOGY/ONCOLOGY CONSULTATION NOTE  Date of Service: 12/25/2017  Patient Care Team: Darrol Jump, PA-C as PCP - General (Family Medicine)  CHIEF COMPLAINTS/PURPOSE OF CONSULTATION:  Retroperitoneal Lymphadenopathy   HISTORY OF PRESENTING ILLNESS:   Bonnie Parker is a wonderful 47 y.o. female who has been referred to [place] by Dr. Melina Schools, her PCP, for evaluation of Lymphadenopathy as seen in lumbar MRI from 12/17/17.   She presents to the clinic today accompanied by her family member. She was having recent back pain and had a MRI of lumbar spine on 12/17/17 which showed changes in her previous decompression and fusion surgeries along with herniated discs in lumbar spine. She also had a incidental finding of retroperitoneal lymphadenopathy.  Her chronic back pain has been going on since 2003 due to a fall. She had back surgery in 2005 and had a laminectomy in 06/2017. She also notes the pain going into her legs that started before her laminectomy. She notes in the last 2 weeks her night sweats have increased. This started right before her laminectomy after use of blood patch.   She also has osteoporosis, Hypothyroidism, extremity swelling which she takes HCTZ for. She is a non-smoker, and rarely drinks. She had a partial hysterectomy in 1999 due to significant endometriosis. She had an oophorectomy in 2002. She denies any pelvic pain since. She denies recent history of infection, cough or cold or prior cancer diagnosis. She notes her mother had cervical cancer. She notes her mother has a blood disorder, cryoglobulin anemia which is now in remission. She was given antibiotics recently in case of UTI in 07/2017. She had a pelvic scan as well at that time.   On review of symptoms, pt chronic back pain, leg pain R>L, recent and increases night sweats, occasional chills, swelling in her hands and feet. She notes she lost 10 pounds but due to use of diet pill, phentermine. She has been  taking phentermine for the past few months. She notes low chest tightness due to back pain recently. She notes constipation that she contributes to pain medication use. She denies recent cough, cold, or infection.     MEDICAL HISTORY:  Past Medical History:  Diagnosis Date  . Anxiety   . Arthritis   . Asthma   . Depression   . Hypothyroidism   . Lumbar spinal stenosis     SURGICAL HISTORY: Past Surgical History:  Procedure Laterality Date  . ABDOMINAL HYSTERECTOMY  1999  . BACK SURGERY  2005   Dr. Ronnald Ramp  . INNER EAR SURGERY    . LUMBAR LAMINECTOMY/DECOMPRESSION MICRODISCECTOMY N/A 06/21/2017   Procedure: Lumbar decompression L3-4 ;  Surgeon: Melina Schools, MD;  Location: [facility];  Service: Orthopedics;  Laterality: N/A;  3 hrs    SOCIAL HISTORY: Social History   Socioeconomic History  . Marital status: Married    Spouse name: Not on file  . Number of children: Not on file  . Years of education: Not on file  . Highest education level: Not on file  Social Needs  . Financial resource strain: Not on file  . Food insecurity - worry: Not on file  . Food insecurity - inability: Not on file  . Transportation needs - medical: Not on file  . Transportation needs - non-medical: Not on file  Occupational History  . Not on file  Tobacco Use  . Smoking status: Never Smoker  . Smokeless tobacco: Never Used  Substance and Sexual Activity  . Alcohol use:  No  . Drug use: No  . Sexual activity: Not on file  Other Topics Concern  . Not on file  Social History Narrative  . Not on file    FAMILY HISTORY: No family history on file.  ALLERGIES:  is allergic to adhesive [tape].  MEDICATIONS:  Current Outpatient Medications  Medication Sig Dispense Refill  . alendronate (FOSAMAX) 70 MG tablet Take 70 mg by mouth every Monday. Take with a full glass of water on an empty stomach.    . ALPRAZolam (XANAX) 0.5 MG tablet Take 0.5 mg by mouth daily as needed for anxiety.    Marland Kitchen  atorvastatin (LIPITOR) 80 MG tablet Take 80 mg by mouth daily.    Marland Kitchen levothyroxine (SYNTHROID, LEVOTHROID) 50 MCG tablet Take 50 mcg by mouth daily before breakfast.    . methocarbamol (ROBAXIN) 500 MG tablet Take 1 tablet (500 mg total) by mouth 3 (three) times daily as needed for muscle spasms. 21 tablet 0  . ondansetron (ZOFRAN) 4 MG tablet Take 1 tablet (4 mg total) by mouth every 8 (eight) hours as needed for nausea or vomiting. 20 tablet 0  . oxyCODONE-acetaminophen (PERCOCET) 10-325 MG tablet Take 1 tablet by mouth every 4 (four) hours as needed for pain. 30 tablet 0  . phentermine 37.5 MG capsule Take 37.5 mg by mouth every morning.    . sertraline (ZOLOFT) 100 MG tablet Take 100 mg by mouth daily.    Marland Kitchen triamcinolone cream (KENALOG) 0.1 % Apply 1 application topically as needed.     No current facility-administered medications for this visit.     REVIEW OF SYSTEMS:   10 Point review of Systems was done is negative except as noted above.  PHYSICAL EXAMINATION: ECOG PERFORMANCE STATUS: 1 - Symptomatic but completely ambulatory  . Vitals:   12/25/17 1101  BP: (!) 141/88  Pulse: 88  Resp: 20  Temp: 98.7 F (37.1 C)  SpO2: 97%   Filed Weights   12/25/17 1101  Weight: 167 lb 6.4 oz (75.9 kg)   .Body mass index is 31.63 kg/m.  GENERAL:alert, in no acute distress and comfortable SKIN: no acute rashes, no significant lesions EYES: conjunctiva are pink and non-injected, sclera anicteric OROPHARYNX: MMM, no exudates, no oropharyngeal erythema or ulceration NECK: supple, no JVD LYMPH:  no palpable lymphadenopathy in the cervical, axillary or inguinal regions LUNGS: clear to auscultation b/l with normal respiratory effort HEART: regular rate & rhythm ABDOMEN:  normoactive bowel sounds, not distended. (+) diffuse tenderness upon pressure, no rebound tenderness MSK: (+) Tenderness of b/l lower flank  Extremity: no pedal edema PSYCH: alert & oriented x 3 with fluent  speech NEURO: no focal motor/sensory deficits  LABORATORY DATA:  I have reviewed the data as listed  . CBC Latest Ref Rng & Units 12/25/2017 06/13/2017  WBC 3.9 - 10.3 K/uL 6.1 7.0  Hemoglobin 11.6 - 15.9 g/dL 12.8 13.6  Hematocrit 34.8 - 46.6 % 39.3 41.9  Platelets 145 - 400 K/uL 281 322   . CBC    Component Value Date/Time   WBC 6.1 12/25/2017 1221   RBC 4.41 12/25/2017 1221   RBC 4.41 12/25/2017 1221   HGB 12.8 12/25/2017 1221   HCT 39.3 12/25/2017 1221   PLT 281 12/25/2017 1221   MCV 89.1 12/25/2017 1221   MCH 29.0 12/25/2017 1221   MCHC 32.6 12/25/2017 1221   RDW 14.3 12/25/2017 1221   LYMPHSABS 1.8 12/25/2017 1221   MONOABS 0.5 12/25/2017 1221   EOSABS 0.4 12/25/2017  1221   BASOSABS 0.0 12/25/2017 1221    . CMP Latest Ref Rng & Units 12/25/2017 06/13/2017  Glucose 70 - 140 mg/dL 93 77  BUN 7 - 26 mg/dL 8 10  Creatinine 0.60 - 1.10 mg/dL 0.87 0.82  Sodium 136 - 145 mmol/L 140 137  Potassium 3.5 - 5.1 mmol/L 3.8 4.2  Chloride 98 - 109 mmol/L 105 103  CO2 22 - 29 mmol/L 26 24  Calcium 8.4 - 10.4 mg/dL 9.8 9.4  Total Protein 6.4 - 8.3 g/dL 7.8 -  Total Bilirubin 0.2 - 1.2 mg/dL 0.3 -  Alkaline Phos 40 - 150 U/L 116 -  AST 5 - 34 U/L 16 -  ALT 0 - 55 U/L 19 -   Component     Latest Ref Rng & Units 12/25/2017  Color, Urine     YELLOW YELLOW  Appearance     CLEAR CLEAR  Specific Gravity, Urine     1.005 - 1.030 1.018  pH     5.0 - 8.0 6.0  Glucose     NEGATIVE mg/dL NEGATIVE  Hgb urine dipstick     NEGATIVE NEGATIVE  Bilirubin Urine     NEGATIVE NEGATIVE  Ketones, ur     NEGATIVE mg/dL NEGATIVE  Protein     NEGATIVE mg/dL NEGATIVE  Nitrite     NEGATIVE NEGATIVE  Leukocytes, UA     NEGATIVE NEGATIVE  RBC / HPF     0 - 5 RBC/hpf 0-5  WBC, UA     0 - 5 WBC/hpf 0-5  Bacteria, UA     NONE SEEN NONE SEEN  Squamous Epithelial / LPF     NONE SEEN 0-5 (A)  Mucus      PRESENT  Specimen Description      URINE, CLEAN CATCH . . .  Special Requests       NONE . Marland Kitchen .  Culture      NO GROWTH . . .  Report Status      12/26/2017 FINAL  Retic Ct Pct     0.7 - 2.1 % 1.6  RBC.     3.70 - 5.45 MIL/uL 4.41  Retic Count, Absolute     33.7 - 90.7 K/uL 70.6  HCV Ab     0.0 - 0.9 s/co ratio <0.1  LDH     125 - 245 U/L 207    RADIOGRAPHIC STUDIES: I have personally reviewed the radiological images as listed and agreed with the findings in the report. No results found.    12/17/17   ASSESSMENT & PLAN:  MORGAN KEINATH is a 47 y.o. caucasian female with   1. Retroperitoneal Lymphadenopathy  As seen on 12/17/17 Lumbar MRI  Initially presented with no palpable cervical, axillary lymph nodes, no enlarged liver or spleen.   PLAN:  -I reviewed her Lumbar MRI with the pt and her family member.  -Given her medical history there is no obvious cause.  -I discussed further workup would be needed to further inspect her lymph nodes. This include CT CAP and baseline labs. She is agreeable.    2. Chronic back pain, Osteoporosis -with associated b/l leg pain R>L, lower chest and abdominal tightness -Has undergone 2 spinal surgeries  -Takes Fosamax for osteoporosis -Takes oxycodone    3. Hypothyroidism  -On Synthroid -Checks TSH every 6 months with PCP   Labs today CT chest/abd/pelvis in 5-7 days RTC with Dr Irene Limbo in 1 week   All of the patients questions  were answered with apparent satisfaction. The patient knows to call the clinic with any problems, questions or concerns.  I spent 35 minutes counseling the patient face to face. The total time spent in the appointment was 45 minutes and more than 50% was on counseling and direct patient cares.    Sullivan Lone MD [place] [facility] Hematology/Oncology Physician [facility]  (Office):       [phone] (Work cell):  [phone] (Fax):           [phone]  12/25/2017 11:51 AM  This document serves as a record of services personally performed by Sullivan Lone, MD. It  was created on his behalf by Joslyn Devon, a trained medical scribe. The creation of this record is based on the scribe's personal observations and the provider's statements to them.    .I have reviewed the above documentation for accuracy and completeness, and I agree with the above. Brunetta Genera MD MS

## 2017-12-26 LAB — URINE CULTURE: Culture: NO GROWTH

## 2017-12-26 LAB — HEPATITIS C ANTIBODY

## 2017-12-27 ENCOUNTER — Encounter (HOSPITAL_COMMUNITY): Payer: Self-pay

## 2017-12-27 ENCOUNTER — Ambulatory Visit (HOSPITAL_COMMUNITY)
Admission: RE | Admit: 2017-12-27 | Discharge: 2017-12-27 | Disposition: A | Discharge status: Home / Self Care | Payer: BLUE CROSS/BLUE SHIELD | Source: Ambulatory Visit | Attending: Hematology | Admitting: Hematology

## 2017-12-27 DIAGNOSIS — R590 Localized enlarged lymph nodes: Secondary | ICD-10-CM | POA: Diagnosis not present

## 2017-12-27 DIAGNOSIS — R0789 Other chest pain: Secondary | ICD-10-CM | POA: Diagnosis present

## 2017-12-27 DIAGNOSIS — I700 Atherosclerosis of aorta: Secondary | ICD-10-CM | POA: Diagnosis not present

## 2017-12-27 DIAGNOSIS — R599 Enlarged lymph nodes, unspecified: Secondary | ICD-10-CM

## 2017-12-27 MED ORDER — IOPAMIDOL (ISOVUE-300) INJECTION 61%
INTRAVENOUS | Status: AC
  Administered 2017-12-27: 100 mL via INTRAVENOUS
  Filled 2017-12-27: qty 100

## 2017-12-27 MED ORDER — IOPAMIDOL (ISOVUE-300) INJECTION 61%
100.0000 mL | Freq: Once | INTRAVENOUS | Status: AC | PRN
  Administered 2017-12-27: 100 mL via INTRAVENOUS

## 2017-12-28 ENCOUNTER — Ambulatory Visit
Admission: RE | Admit: 2017-12-28 | Discharge: 2017-12-28 | Disposition: A | Discharge status: Home / Self Care | Payer: Self-pay | Source: Ambulatory Visit | Attending: Hematology | Admitting: Hematology

## 2017-12-28 ENCOUNTER — Other Ambulatory Visit: Payer: Self-pay | Admitting: Hematology

## 2017-12-28 DIAGNOSIS — C801 Malignant (primary) neoplasm, unspecified: Secondary | ICD-10-CM

## 2017-12-31 DIAGNOSIS — G5601 Carpal tunnel syndrome, right upper limb: Secondary | ICD-10-CM | POA: Insufficient documentation

## 2017-12-31 DIAGNOSIS — G5602 Carpal tunnel syndrome, left upper limb: Secondary | ICD-10-CM | POA: Insufficient documentation

## 2018-01-01 ENCOUNTER — Encounter: Payer: Self-pay | Admitting: Hematology

## 2018-01-01 ENCOUNTER — Inpatient Hospital Stay (HOSPITAL_BASED_OUTPATIENT_CLINIC_OR_DEPARTMENT_OTHER): Payer: BLUE CROSS/BLUE SHIELD | Admitting: Hematology

## 2018-01-01 ENCOUNTER — Telehealth: Payer: Self-pay | Admitting: Hematology

## 2018-01-01 VITALS — BP 127/81 | HR 86 | Temp 98.4°F | Resp 18 | Ht 61.0 in | Wt 165.3 lb

## 2018-01-01 DIAGNOSIS — R590 Localized enlarged lymph nodes: Secondary | ICD-10-CM | POA: Diagnosis not present

## 2018-01-01 DIAGNOSIS — G8929 Other chronic pain: Secondary | ICD-10-CM

## 2018-01-01 DIAGNOSIS — E039 Hypothyroidism, unspecified: Secondary | ICD-10-CM | POA: Diagnosis not present

## 2018-01-01 DIAGNOSIS — M549 Dorsalgia, unspecified: Secondary | ICD-10-CM | POA: Diagnosis not present

## 2018-01-01 DIAGNOSIS — R599 Enlarged lymph nodes, unspecified: Secondary | ICD-10-CM

## 2018-01-01 DIAGNOSIS — M810 Age-related osteoporosis without current pathological fracture: Secondary | ICD-10-CM | POA: Diagnosis not present

## 2018-01-01 DIAGNOSIS — K5900 Constipation, unspecified: Secondary | ICD-10-CM | POA: Diagnosis not present

## 2018-01-01 NOTE — Progress Notes (Signed)
HEMATOLOGY/ONCOLOGY FOLLOW UP NOTE  Date of Service: 01/01/2018  Patient Care Team: Darrol Jump, PA-C as PCP - General (Family Medicine)  CHIEF COMPLAINTS/PURPOSE OF CONSULTATION:  F/u for Retroperitoneal Lymphadenopathy   HISTORY OF PRESENTING ILLNESS:   Bonnie Parker is a wonderful 47 y.o. female who has been referred to [place] by Dr. Melina Schools, her PCP, for evaluation of Lymphadenopathy as seen in lumbar MRI from 12/17/17.   She presents to the clinic today accompanied by her family member. She was having recent back pain and had a MRI of lumbar spine on 12/17/17 which showed changes in her previous decompression and fusion surgeries along with herniated discs in lumbar spine. She also had a incidental finding of retroperitoneal lymphadenopathy.  Her chronic back pain has been going on since 2003 due to a fall. She had back surgery in 2005 and had a laminectomy in 06/2017. She also notes the pain going into her legs that started before her laminectomy. She notes in the last 2 weeks her night sweats have increased. This started right before her laminectomy after use of blood patch.   She also has osteoporosis, Hypothyroidism, extremity swelling which she takes HCTZ for. She is a non-smoker, and rarely drinks. She had a partial hysterectomy in 1999 due to significant endometriosis. She had an oophorectomy in 2002. She denies any pelvic pain since. She denies recent history of infection, cough or cold or prior cancer diagnosis. She notes her mother had cervical cancer. She notes her mother has a blood disorder, cryoglobulin anemia which is now in remission. She was given antibiotics recently in case of UTI in 07/2017. She had a pelvic scan as well at that time.   On review of symptoms, pt chronic back pain, leg pain R>L, recent and increases night sweats, occasional chills, swelling in her hands and feet. She notes she lost 10 pounds but due to use of diet pill, phentermine. She has been  taking phentermine for the past few months. She notes low chest tightness due to back pain recently. She notes constipation that she contributes to pain medication use. She denies recent cough, cold, or infection.     INTERVAL HISTORY   Bonnie Parker is here for a follow up and to discuss her workup. She presents to the clinic today accompanied by her husband. She notes no new changes since last visit.  Her CT chest/abd/pelvis results were reviewed in details and diagnostic possibilities and evaluation approaches discussed in details. On review of symptoms, pt notes normal bowel movements, no urinary symptoms, fever, chills, night sweats, no back pain. She notes some SOB occasionally that she attributes to her back spasms.She denies pain or tenderness in LUQ.     MEDICAL HISTORY:  Past Medical History:  Diagnosis Date  . Anxiety   . Arthritis   . Asthma   . Depression   . Hypothyroidism   . Lumbar spinal stenosis     SURGICAL HISTORY: Past Surgical History:  Procedure Laterality Date  . ABDOMINAL HYSTERECTOMY  1999  . BACK SURGERY  2005   Dr. Ronnald Ramp  . INNER EAR SURGERY    . LUMBAR LAMINECTOMY/DECOMPRESSION MICRODISCECTOMY N/A 06/21/2017   Procedure: Lumbar decompression L3-4 ;  Surgeon: Melina Schools, MD;  Location: [facility];  Service: Orthopedics;  Laterality: N/A;  3 hrs    SOCIAL HISTORY: Social History   Socioeconomic History  . Marital status: Married    Spouse name: Not on file  . Number of children:  Not on file  . Years of education: Not on file  . Highest education level: Not on file  Occupational History  . Not on file  Social Needs  . Financial resource strain: Not on file  . Food insecurity:    Worry: Not on file    Inability: Not on file  . Transportation needs:    Medical: Not on file    Non-medical: Not on file  Tobacco Use  . Smoking status: Never Smoker  . Smokeless tobacco: Never Used  Substance and Sexual Activity  . Alcohol use: No  . Drug  use: No  . Sexual activity: Not on file  Lifestyle  . Physical activity:    Days per week: Not on file    Minutes per session: Not on file  . Stress: Not on file  Relationships  . Social connections:    Talks on phone: Not on file    Gets together: Not on file    Attends religious service: Not on file    Active member of club or organization: Not on file    Attends meetings of clubs or organizations: Not on file    Relationship status: Not on file  . Intimate partner violence:    Fear of current or ex partner: Not on file    Emotionally abused: Not on file    Physically abused: Not on file    Forced sexual activity: Not on file  Other Topics Concern  . Not on file  Social History Narrative  . Not on file    FAMILY HISTORY: History reviewed. No pertinent family history.  ALLERGIES:  is allergic to adhesive [tape].  MEDICATIONS:  Current Outpatient Medications  Medication Sig Dispense Refill  . alendronate (FOSAMAX) 70 MG tablet Take 70 mg by mouth every Monday. Take with a full glass of water on an empty stomach.    . ALPRAZolam (XANAX) 0.5 MG tablet Take 0.5 mg by mouth daily as needed for anxiety.    Marland Kitchen atorvastatin (LIPITOR) 80 MG tablet Take 80 mg by mouth daily.    Marland Kitchen levothyroxine (SYNTHROID, LEVOTHROID) 50 MCG tablet Take 50 mcg by mouth daily before breakfast.    . methocarbamol (ROBAXIN) 500 MG tablet Take 1 tablet (500 mg total) by mouth 3 (three) times daily as needed for muscle spasms. 21 tablet 0  . ondansetron (ZOFRAN) 4 MG tablet Take 1 tablet (4 mg total) by mouth every 8 (eight) hours as needed for nausea or vomiting. 20 tablet 0  . oxyCODONE-acetaminophen (PERCOCET) 10-325 MG tablet Take 1 tablet by mouth every 4 (four) hours as needed for pain. 30 tablet 0  . phentermine 37.5 MG capsule Take 37.5 mg by mouth every morning.    . sertraline (ZOLOFT) 100 MG tablet Take 100 mg by mouth daily.    Marland Kitchen triamcinolone cream (KENALOG) 0.1 % Apply 1 application topically  as needed.     No current facility-administered medications for this visit.     REVIEW OF SYSTEMS:   .I have reviewed the above documentation for accuracy and completeness, and I agree with the above.  PHYSICAL EXAMINATION: ECOG PERFORMANCE STATUS: 1 - Symptomatic but completely ambulatory  . Vitals:   01/01/18 1344  BP: 127/81  Pulse: 86  Resp: 18  Temp: 98.4 F (36.9 C)  SpO2: 100%   Filed Weights   01/01/18 1344  Weight: 165 lb 4.8 oz (75 kg)   .Body mass index is 31.23 kg/m.  GENERAL:alert, in no acute distress  and comfortable SKIN: no acute rashes, no significant lesions EYES: conjunctiva are pink and non-injected, sclera anicteric OROPHARYNX: MMM, no exudates, no oropharyngeal erythema or ulceration NECK: supple, no JVD LYMPH:  no palpable lymphadenopathy in the cervical, axillary or inguinal regions LUNGS: clear to auscultation b/l with normal respiratory effort HEART: regular rate & rhythm ABDOMEN:  normoactive bowel sounds , non tender, not distended. Extremity: no pedal edema PSYCH: alert & oriented x 3 with fluent speech NEURO: no focal motor/sensory deficits   LABORATORY DATA:  I have reviewed the data as listed  . CBC Latest Ref Rng & Units 12/25/2017 06/13/2017  WBC 3.9 - 10.3 K/uL 6.1 7.0  Hemoglobin 11.6 - 15.9 g/dL 12.8 13.6  Hematocrit 34.8 - 46.6 % 39.3 41.9  Platelets 145 - 400 K/uL 281 322   . CBC    Component Value Date/Time   WBC 6.1 12/25/2017 1221   RBC 4.41 12/25/2017 1221   RBC 4.41 12/25/2017 1221   HGB 12.8 12/25/2017 1221   HCT 39.3 12/25/2017 1221   PLT 281 12/25/2017 1221   MCV 89.1 12/25/2017 1221   MCH 29.0 12/25/2017 1221   MCHC 32.6 12/25/2017 1221   RDW 14.3 12/25/2017 1221   LYMPHSABS 1.8 12/25/2017 1221   MONOABS 0.5 12/25/2017 1221   EOSABS 0.4 12/25/2017 1221   BASOSABS 0.0 12/25/2017 1221    . CMP Latest Ref Rng & Units 12/25/2017 06/13/2017  Glucose 70 - 140 mg/dL 93 77  BUN 7 - 26 mg/dL 8 10  Creatinine  0.60 - 1.10 mg/dL 0.87 0.82  Sodium 136 - 145 mmol/L 140 137  Potassium 3.5 - 5.1 mmol/L 3.8 4.2  Chloride 98 - 109 mmol/L 105 103  CO2 22 - 29 mmol/L 26 24  Calcium 8.4 - 10.4 mg/dL 9.8 9.4  Total Protein 6.4 - 8.3 g/dL 7.8 -  Total Bilirubin 0.2 - 1.2 mg/dL 0.3 -  Alkaline Phos 40 - 150 U/L 116 -  AST 5 - 34 U/L 16 -  ALT 0 - 55 U/L 19 -    Component     Latest Ref Rng & Units 12/25/2017  Color, Urine     YELLOW YELLOW  Appearance     CLEAR CLEAR  Specific Gravity, Urine     1.005 - 1.030 1.018  pH     5.0 - 8.0 6.0  Glucose     NEGATIVE mg/dL NEGATIVE  Hgb urine dipstick     NEGATIVE NEGATIVE  Bilirubin Urine     NEGATIVE NEGATIVE  Ketones, ur     NEGATIVE mg/dL NEGATIVE  Protein     NEGATIVE mg/dL NEGATIVE  Nitrite     NEGATIVE NEGATIVE  Leukocytes, UA     NEGATIVE NEGATIVE  RBC / HPF     0 - 5 RBC/hpf 0-5  WBC, UA     0 - 5 WBC/hpf 0-5  Bacteria, UA     NONE SEEN NONE SEEN  Squamous Epithelial / LPF     NONE SEEN 0-5 (A)  Mucus      PRESENT  Specimen Description      URINE, CLEAN CATCH . . .  Special Requests      NONE . Marland Kitchen .  Culture      NO GROWTH . . .  Report Status      12/26/2017 FINAL  Retic Ct Pct     0.7 - 2.1 % 1.6  RBC.     3.70 - 5.45 MIL/uL 4.41  Retic  Count, Absolute     33.7 - 90.7 K/uL 70.6  HCV Ab     0.0 - 0.9 s/co ratio <0.1  LDH     125 - 245 U/L 207    RADIOGRAPHIC STUDIES: I have personally reviewed the radiological images as listed and agreed with the findings in the report. Ct Chest W Contrast  Result Date: 12/27/2017 CLINICAL DATA:  Chest tightness. Reported history of retroperitoneal lymphadenopathy on outside MRI. EXAM: CT CHEST, ABDOMEN, AND PELVIS WITH CONTRAST TECHNIQUE: Multidetector CT imaging of the chest, abdomen and pelvis was performed following the standard protocol during bolus administration of intravenous contrast. CONTRAST:  177mL ISOVUE-300 IOPAMIDOL (ISOVUE-300) INJECTION 61% COMPARISON:  None.  FINDINGS: CT CHEST FINDINGS Cardiovascular: Normal heart size. No significant pericardial fluid/thickening. Great vessels are normal in course and caliber. No central pulmonary emboli. Mediastinum/Nodes: No discrete thyroid nodules. Unremarkable esophagus. No pathologically enlarged axillary, mediastinal or hilar lymph nodes. Lungs/Pleura: No pneumothorax. No pleural effusion. No acute consolidative airspace disease, lung masses or significant pulmonary nodules. Musculoskeletal: No aggressive appearing focal osseous lesions. Mild thoracic spondylosis. CT ABDOMEN PELVIS FINDINGS Hepatobiliary: Normal liver with no liver mass. Normal gallbladder with no radiopaque cholelithiasis. No biliary ductal dilatation. Pancreas: Normal, with no mass or duct dilation. Spleen: Normal size spleen. Heterogeneous nodular hypoenhancement throughout the spleen on the venous phase sequence, with no discrete splenic masses on the delayed sequence. Adrenals/Urinary Tract: Normal adrenals. Normal kidneys with no hydronephrosis and no renal mass. Normal bladder. Stomach/Bowel: Normal non-distended stomach. Normal caliber small bowel with no small bowel wall thickening. Normal appendix. Normal large bowel with no diverticulosis, large bowel wall thickening or pericolonic fat stranding. Vascular/Lymphatic: Mildly atherosclerotic nonaneurysmal abdominal aorta. Patent portal, splenic, hepatic and renal veins. Mild porta hepatis adenopathy up to 1.7 cm (series 2/image 53). Mildly enlarged 1.3 cm posterior paracaval node (series 2/image 74). Mild left para-aortic adenopathy up to 1.0 cm (series 2/image 74). Mild bilateral common iliac adenopathy, largest 1.2 cm on the left (series 2/image 83). Mild right pelvic sidewall adenopathy up to 1.0 cm (series 2/image 101). Reproductive: Status post hysterectomy, with no abnormal findings at the vaginal cuff. No adnexal mass. Other: No pneumoperitoneum, ascites or focal fluid collection. Musculoskeletal:  No aggressive appearing focal osseous lesions. Status post bilateral posterior spinal fusion L4-S1. Bone cages in the L4-5 and L5-S1 disc spaces. Mild lumbar spondylosis. IMPRESSION: 1. Mild porta hepatis, retroperitoneal and bilateral pelvic lymphadenopathy. Lymphoproliferative disorder not excluded. Management options may include percutaneous tissue sampling of a left para-aortic node or close CT surveillance in 3 months. 2. No thoracic adenopathy. 3.  Aortic Atherosclerosis (ICD10-I70.0). Electronically Signed   By: Ilona Sorrel M.D.   On: 12/27/2017 12:20   Ct Abdomen Pelvis W Contrast  Result Date: 12/27/2017 CLINICAL DATA:  Chest tightness. Reported history of retroperitoneal lymphadenopathy on outside MRI. EXAM: CT CHEST, ABDOMEN, AND PELVIS WITH CONTRAST TECHNIQUE: Multidetector CT imaging of the chest, abdomen and pelvis was performed following the standard protocol during bolus administration of intravenous contrast. CONTRAST:  181mL ISOVUE-300 IOPAMIDOL (ISOVUE-300) INJECTION 61% COMPARISON:  None. FINDINGS: CT CHEST FINDINGS Cardiovascular: Normal heart size. No significant pericardial fluid/thickening. Great vessels are normal in course and caliber. No central pulmonary emboli. Mediastinum/Nodes: No discrete thyroid nodules. Unremarkable esophagus. No pathologically enlarged axillary, mediastinal or hilar lymph nodes. Lungs/Pleura: No pneumothorax. No pleural effusion. No acute consolidative airspace disease, lung masses or significant pulmonary nodules. Musculoskeletal: No aggressive appearing focal osseous lesions. Mild thoracic spondylosis. CT ABDOMEN PELVIS FINDINGS Hepatobiliary: Normal  liver with no liver mass. Normal gallbladder with no radiopaque cholelithiasis. No biliary ductal dilatation. Pancreas: Normal, with no mass or duct dilation. Spleen: Normal size spleen. Heterogeneous nodular hypoenhancement throughout the spleen on the venous phase sequence, with no discrete splenic masses on  the delayed sequence. Adrenals/Urinary Tract: Normal adrenals. Normal kidneys with no hydronephrosis and no renal mass. Normal bladder. Stomach/Bowel: Normal non-distended stomach. Normal caliber small bowel with no small bowel wall thickening. Normal appendix. Normal large bowel with no diverticulosis, large bowel wall thickening or pericolonic fat stranding. Vascular/Lymphatic: Mildly atherosclerotic nonaneurysmal abdominal aorta. Patent portal, splenic, hepatic and renal veins. Mild porta hepatis adenopathy up to 1.7 cm (series 2/image 53). Mildly enlarged 1.3 cm posterior paracaval node (series 2/image 74). Mild left para-aortic adenopathy up to 1.0 cm (series 2/image 74). Mild bilateral common iliac adenopathy, largest 1.2 cm on the left (series 2/image 83). Mild right pelvic sidewall adenopathy up to 1.0 cm (series 2/image 101). Reproductive: Status post hysterectomy, with no abnormal findings at the vaginal cuff. No adnexal mass. Other: No pneumoperitoneum, ascites or focal fluid collection. Musculoskeletal: No aggressive appearing focal osseous lesions. Status post bilateral posterior spinal fusion L4-S1. Bone cages in the L4-5 and L5-S1 disc spaces. Mild lumbar spondylosis. IMPRESSION: 1. Mild porta hepatis, retroperitoneal and bilateral pelvic lymphadenopathy. Lymphoproliferative disorder not excluded. Management options may include percutaneous tissue sampling of a left para-aortic node or close CT surveillance in 3 months. 2. No thoracic adenopathy. 3.  Aortic Atherosclerosis (ICD10-I70.0). Electronically Signed   By: Ilona Sorrel M.D.   On: 12/27/2017 12:20   Mr Outside Films Spine  Result Date: 12/28/2017 This examination belongs to an outside facility and is stored here for comparison purposes only.  Contact the originating outside institution for any associated report or interpretation.     12/17/17   ASSESSMENT & PLAN:   Bonnie Parker is a 47 y.o. caucasian female with   1.  Retroperitoneal Lymphadenopathy  As seen on 12/17/17 Lumbar MRI  Initially presented with no palpable cervical, axillary lymph nodes, no enlarged liver or spleen.  Baseline labs WNL   CT CAP 12/27/17 with IMPRESSION: 1. Mild porta hepatis, retroperitoneal and bilateral pelvic lymphadenopathy. Lymphoproliferative disorder not excluded. 2. No thoracic adenopathy. 3.  Aortic Atherosclerosis    PLAN:  -I reviewed her blood workup which showed no abnormalities, all WNL.  -I discussed her CT CAP from 12/27/17 which shows no enlarged lymph nodes in her chest. There were mild enlargement of lymph nodes in her abdomen and pelvis. No bone abnormalities.   -we dicussed that the small abd/pelvis LNadenoapathy be a reactive process or for a low grade Lymphoproliferative process vs less likely other pathology. -Given lack of symptoms, the size  Of intra-abd LN and difficult to percutaneously biopsy locations - I discussed her options of observation or considering a challenging lymph node biopsy. -She would like to proceed with observation until next CT AP scan in 2-3 months.  -I advised her to notify us in the meantime if she develops concerning symptoms of palpable lymph nodes, significant change in bowel or urinary habits or new abdominal pain or other constitutional symptoms.   2. Chronic back pain, Osteoporosis -with associated b/l leg pain R>L, lower chest and abdominal tightness -Has undergone 2 spinal surgeries  -Takes Fosamax for osteoporosis -Takes oxycodone prn  3. Hypothyroidism  -On Synthroid -Checks TSH every 6 months with PCP   CT Abdomen/Pelvis in 12 weeks  RTC with Dr Irene Limbo in 3 months with  labs/CT   All of the patients questions were answered with apparent satisfaction. The patient knows to call the clinic with any problems, questions or concerns. . The total time spent in the appointment was 25 minutes and more than 50% was on counseling and direct patient cares.      Sullivan Lone MD [place] AAHIVMS [facility] Hematology/Oncology Physician [facility]  (Office):       [phone] (Work cell):  [phone] (Fax):           [phone]  01/01/2018 2:07 PM  This document serves as a record of services personally performed by Sullivan Lone, MD. It was created on his behalf by Joslyn Devon, a trained medical scribe. The creation of this record is based on the scribe's personal observations and the provider's statements to them.    .I have reviewed the above documentation for accuracy and completeness, and I agree with the above. Brunetta Genera MD MS

## 2018-01-01 NOTE — Telephone Encounter (Signed)
Scheduled appt per 3/26 los - Gave patient AVS and calender per los.  

## 2018-02-14 ENCOUNTER — Encounter: Payer: Self-pay | Admitting: Hematology

## 2018-03-07 ENCOUNTER — Encounter: Payer: Self-pay | Admitting: Hematology

## 2018-03-08 ENCOUNTER — Encounter: Payer: Self-pay | Admitting: Hematology

## 2018-03-26 ENCOUNTER — Ambulatory Visit (HOSPITAL_COMMUNITY)
Admission: RE | Admit: 2018-03-26 | Discharge: 2018-03-26 | Disposition: A | Discharge status: Home / Self Care | Payer: BLUE CROSS/BLUE SHIELD | Source: Ambulatory Visit | Attending: Hematology | Admitting: Hematology

## 2018-03-26 ENCOUNTER — Inpatient Hospital Stay: Payer: BLUE CROSS/BLUE SHIELD | Attending: Hematology

## 2018-03-26 DIAGNOSIS — R197 Diarrhea, unspecified: Secondary | ICD-10-CM | POA: Insufficient documentation

## 2018-03-26 DIAGNOSIS — R110 Nausea: Secondary | ICD-10-CM | POA: Diagnosis not present

## 2018-03-26 DIAGNOSIS — M810 Age-related osteoporosis without current pathological fracture: Secondary | ICD-10-CM | POA: Insufficient documentation

## 2018-03-26 DIAGNOSIS — E039 Hypothyroidism, unspecified: Secondary | ICD-10-CM | POA: Insufficient documentation

## 2018-03-26 DIAGNOSIS — R590 Localized enlarged lymph nodes: Secondary | ICD-10-CM | POA: Diagnosis not present

## 2018-03-26 DIAGNOSIS — R1010 Upper abdominal pain, unspecified: Secondary | ICD-10-CM | POA: Diagnosis not present

## 2018-03-26 DIAGNOSIS — G8929 Other chronic pain: Secondary | ICD-10-CM | POA: Diagnosis not present

## 2018-03-26 DIAGNOSIS — K5900 Constipation, unspecified: Secondary | ICD-10-CM | POA: Insufficient documentation

## 2018-03-26 DIAGNOSIS — R61 Generalized hyperhidrosis: Secondary | ICD-10-CM | POA: Insufficient documentation

## 2018-03-26 DIAGNOSIS — M549 Dorsalgia, unspecified: Secondary | ICD-10-CM | POA: Diagnosis not present

## 2018-03-26 DIAGNOSIS — R599 Enlarged lymph nodes, unspecified: Secondary | ICD-10-CM

## 2018-03-26 DIAGNOSIS — R5383 Other fatigue: Secondary | ICD-10-CM | POA: Insufficient documentation

## 2018-03-26 LAB — CBC WITH DIFFERENTIAL/PLATELET
BASOS PCT: 1 %
Basophils Absolute: 0 10*3/uL (ref 0.0–0.1)
Eosinophils Absolute: 0.4 10*3/uL (ref 0.0–0.5)
Eosinophils Relative: 6 %
HEMATOCRIT: 39 % (ref 34.8–46.6)
HEMOGLOBIN: 12.8 g/dL (ref 11.6–15.9)
LYMPHS ABS: 1.8 10*3/uL (ref 0.9–3.3)
Lymphocytes Relative: 28 %
MCH: 29.3 pg (ref 25.1–34.0)
MCHC: 32.8 g/dL (ref 31.5–36.0)
MCV: 89.2 fL (ref 79.5–101.0)
MONO ABS: 0.4 10*3/uL (ref 0.1–0.9)
MONOS PCT: 7 %
NEUTROS ABS: 3.7 10*3/uL (ref 1.5–6.5)
NEUTROS PCT: 58 %
Platelets: 303 10*3/uL (ref 145–400)
RBC: 4.37 MIL/uL (ref 3.70–5.45)
RDW: 14.2 % (ref 11.2–14.5)
WBC: 6.2 10*3/uL (ref 3.9–10.3)

## 2018-03-26 LAB — CMP (CANCER CENTER ONLY)
ALBUMIN: 4 g/dL (ref 3.5–5.0)
ALK PHOS: 99 U/L (ref 40–150)
ALT: 20 U/L (ref 0–55)
ANION GAP: 9 (ref 3–11)
AST: 18 U/L (ref 5–34)
BUN: 8 mg/dL (ref 7–26)
CALCIUM: 9.4 mg/dL (ref 8.4–10.4)
CHLORIDE: 105 mmol/L (ref 98–109)
CO2: 26 mmol/L (ref 22–29)
CREATININE: 0.81 mg/dL (ref 0.60–1.10)
GFR, Est AFR Am: >60 mL/min (ref >60)
GFR, Estimated: >60 mL/min (ref >60)
GLUCOSE: 83 mg/dL (ref 70–140)
Potassium: 4 mmol/L (ref 3.5–5.1)
SODIUM: 140 mmol/L (ref 136–145)
Total Bilirubin: 0.3 mg/dL (ref 0.2–1.2)
Total Protein: 7.3 g/dL (ref 6.4–8.3)

## 2018-03-26 LAB — RETICULOCYTES
RBC.: 4.37 MIL/uL (ref 3.70–5.45)
RETIC COUNT ABSOLUTE: 61.2 10*3/uL (ref 33.7–90.7)
Retic Ct Pct: 1.4 % (ref 0.7–2.1)

## 2018-03-26 MED ORDER — IOPAMIDOL (ISOVUE-300) INJECTION 61%
100.0000 mL | Freq: Once | INTRAVENOUS | Status: AC | PRN
  Administered 2018-03-26: 100 mL via INTRAVENOUS

## 2018-03-26 MED ORDER — IOPAMIDOL (ISOVUE-300) INJECTION 61%
INTRAVENOUS | Status: AC
  Filled 2018-03-26: qty 100

## 2018-03-27 NOTE — Progress Notes (Signed)
HEMATOLOGY/ONCOLOGY FOLLOW UP NOTE  Date of Service: 03/28/2018  Patient Care Team: Darrol Jump, PA-C as PCP - General (Family Medicine)  CHIEF COMPLAINTS/PURPOSE OF CONSULTATION:  F/u for Retroperitoneal Lymphadenopathy   HISTORY OF PRESENTING ILLNESS:   Bonnie Parker is a wonderful 47 y.o. female who has been referred to [place] by Dr. Melina Schools, her PCP, for evaluation of Lymphadenopathy as seen in lumbar MRI from 12/17/17.   She presents to the clinic today accompanied by her family member. She was having recent back pain and had a MRI of lumbar spine on 12/17/17 which showed changes in her previous decompression and fusion surgeries along with herniated discs in lumbar spine. She also had a incidental finding of retroperitoneal lymphadenopathy.  Her chronic back pain has been going on since 2003 due to a fall. She had back surgery in 2005 and had a laminectomy in 06/2017. She also notes the pain going into her legs that started before her laminectomy. She notes in the last 2 weeks her night sweats have increased. This started right before her laminectomy after use of blood patch.   She also has osteoporosis, Hypothyroidism, extremity swelling which she takes HCTZ for. She is a non-smoker, and rarely drinks. She had a partial hysterectomy in 1999 due to significant endometriosis. She had an oophorectomy in 2002. She denies any pelvic pain since. She denies recent history of infection, cough or cold or prior cancer diagnosis. She notes her mother had cervical cancer. She notes her mother has a blood disorder, cryoglobulin anemia which is now in remission. She was given antibiotics recently in case of UTI in 07/2017. She had a pelvic scan as well at that time.   On review of symptoms, pt chronic back pain, leg pain R>L, recent and increases night sweats, occasional chills, swelling in her hands and feet. She notes she lost 10 pounds but due to use of diet pill, phentermine. She has been  taking phentermine for the past few months. She notes low chest tightness due to back pain recently. She notes constipation that she contributes to pain medication use. She denies recent cough, cold, or infection.     INTERVAL HISTORY   Bonnie Parker is here for a follow up regarding her retroperitoneal lymphadenopathy. The patient's last visit with [place] was on 01/01/18. She is accompanied today by her husband. The pt reports that she is doing well overall.   The pt reports that she has developed a sense of fullness in her abdomen, nausea, loose stools, and frequent belching in the last month. She stopped taking Phentermine when these symptoms presented, and she affirms that her thyroid levels have been optimized. She denies associating her symptoms with any specific food group. She adds that she has taken Fosamax for several years and her Zoloft dose hasn't changed in the last 4 years.  She denies any historical concerns for gastroparesis. She also notes that she has develop new night sweats in the last 3 weeks and more fatigue. She notes that her symptoms presented and have remained stable.   She notes that her diarrhea has been watery, and she fluctuates towards constipation and loose stools as well. She denies any exposure to individuals with similar symptoms or GI symptoms. She also notes that the nausea occurs 30-60 minutes after she eats and anti-acids helps to alleviate this nausea somewhat.   She notes some pain to palpation in the center and left side of her abdomen, and denies any pain  associated with palpation of her right upper quadrant.   She had a full hysterectomy in 1999 with removal of her ovaries as well.   Of note since the patient's last visit, pt has had CT Abdomen/Pelvis completed on 03/26/18 with results revealing 1. Stable scattered upper abdominal and retroperitoneal and pelvic lymph nodes. The retroperitoneal nodes appears stable since a prior CT myelogram from 2018. These  are likely benign. 2. No acute abdominal/pelvic findings or mass lesions. 3. I would recommend a repeat CT scan in March 2020 which would be a 1 year follow-up from the initial CT scan.  Lab results today (03/26/18) of CBC, CMP, and Reticulocytes is as follows: all values are WNL.  On review of systems, pt reports upper abdomen discomfort, sense of fullness, intermittent cycles of constipation and diarrhea, night sweats, fatigue, nausea, occasional abdominal cramping, and denies blood in the stools, mucous in the stools, vaginal bleeding, fevers, chills, and any other symptoms.   MEDICAL HISTORY:  Past Medical History:  Diagnosis Date  . Anxiety   . Arthritis   . Asthma   . Depression   . Hypothyroidism   . Lumbar spinal stenosis     SURGICAL HISTORY: Past Surgical History:  Procedure Laterality Date  . ABDOMINAL HYSTERECTOMY  1999  . BACK SURGERY  2005   Dr. Ronnald Ramp  . INNER EAR SURGERY    . LUMBAR LAMINECTOMY/DECOMPRESSION MICRODISCECTOMY N/A 06/21/2017   Procedure: Lumbar decompression L3-4 ;  Surgeon: Melina Schools, MD;  Location: [facility];  Service: Orthopedics;  Laterality: N/A;  3 hrs    SOCIAL HISTORY: Social History   Socioeconomic History  . Marital status: Married    Spouse name: Not on file  . Number of children: Not on file  . Years of education: Not on file  . Highest education level: Not on file  Occupational History  . Not on file  Social Needs  . Financial resource strain: Not on file  . Food insecurity:    Worry: Not on file    Inability: Not on file  . Transportation needs:    Medical: Not on file    Non-medical: Not on file  Tobacco Use  . Smoking status: Never Smoker  . Smokeless tobacco: Never Used  Substance and Sexual Activity  . Alcohol use: No  . Drug use: No  . Sexual activity: Not on file  Lifestyle  . Physical activity:    Days per week: Not on file    Minutes per session: Not on file  . Stress: Not on file  Relationships  . Social  connections:    Talks on phone: Not on file    Gets together: Not on file    Attends religious service: Not on file    Active member of club or organization: Not on file    Attends meetings of clubs or organizations: Not on file    Relationship status: Not on file  . Intimate partner violence:    Fear of current or ex partner: Not on file    Emotionally abused: Not on file    Physically abused: Not on file    Forced sexual activity: Not on file  Other Topics Concern  . Not on file  Social History Narrative  . Not on file    FAMILY HISTORY: History reviewed. No pertinent family history.  ALLERGIES:  is allergic to adhesive [tape].  MEDICATIONS:  Current Outpatient Medications  Medication Sig Dispense Refill  . alendronate (FOSAMAX) 70 MG tablet  Take 70 mg by mouth every Monday. Take with a full glass of water on an empty stomach.    . ALPRAZolam (XANAX) 0.5 MG tablet Take 0.5 mg by mouth daily as needed for anxiety.    Marland Kitchen atorvastatin (LIPITOR) 80 MG tablet Take 80 mg by mouth daily.    Marland Kitchen levothyroxine (SYNTHROID, LEVOTHROID) 50 MCG tablet Take 50 mcg by mouth daily before breakfast.    . methocarbamol (ROBAXIN) 500 MG tablet Take 1 tablet (500 mg total) by mouth 3 (three) times daily as needed for muscle spasms. 21 tablet 0  . ondansetron (ZOFRAN) 4 MG tablet Take 1 tablet (4 mg total) by mouth every 8 (eight) hours as needed for nausea or vomiting. 20 tablet 0  . oxyCODONE-acetaminophen (PERCOCET) 10-325 MG tablet Take 1 tablet by mouth every 4 (four) hours as needed for pain. 30 tablet 0  . phentermine 37.5 MG capsule Take 37.5 mg by mouth every morning.    . sertraline (ZOLOFT) 100 MG tablet Take 100 mg by mouth daily.    Marland Kitchen triamcinolone cream (KENALOG) 0.1 % Apply 1 application topically as needed.     No current facility-administered medications for this visit.     REVIEW OF SYSTEMS:   A 10+ POINT REVIEW OF SYSTEMS WAS OBTAINED including neurology, dermatology,  psychiatry, cardiac, respiratory, lymph, extremities, GI, GU, Musculoskeletal, constitutional, breasts, reproductive, HEENT.  All pertinent positives are noted in the HPI.  All others are negative.   PHYSICAL EXAMINATION: ECOG PERFORMANCE STATUS: 1 - Symptomatic but completely ambulatory  . Vitals:   03/28/18 1347  BP: (!) 150/90  Pulse: 81  Resp: 18  Temp: 98.5 F (36.9 C)  SpO2: 97%   Filed Weights   03/28/18 1347  Weight: 161 lb 9.6 oz (73.3 kg)   .Body mass index is 30.53 kg/m.  GENERAL:alert, in no acute distress and comfortable SKIN: no acute rashes, no significant lesions EYES: conjunctiva are pink and non-injected, sclera anicteric OROPHARYNX: MMM, no exudates, no oropharyngeal erythema or ulceration NECK: supple, no JVD LYMPH:  no palpable lymphadenopathy in the cervical, axillary or inguinal regions LUNGS: clear to auscultation b/l with normal respiratory effort HEART: regular rate & rhythm ABDOMEN:  normoactive bowel sounds , non tender, not distended. Extremity: no pedal edema PSYCH: alert & oriented x 3 with fluent speech NEURO: no focal motor/sensory deficits    LABORATORY DATA:  I have reviewed the data as listed  . CBC Latest Ref Rng & Units 03/26/2018 12/25/2017 06/13/2017  WBC 3.9 - 10.3 K/uL 6.2 6.1 7.0  Hemoglobin 11.6 - 15.9 g/dL 12.8 12.8 13.6  Hematocrit 34.8 - 46.6 % 39.0 39.3 41.9  Platelets 145 - 400 K/uL 303 281 322   . CBC    Component Value Date/Time   WBC 6.2 03/26/2018 1029   RBC 4.37 03/26/2018 1029   RBC 4.37 03/26/2018 1029   HGB 12.8 03/26/2018 1029   HCT 39.0 03/26/2018 1029   PLT 303 03/26/2018 1029   MCV 89.2 03/26/2018 1029   MCH 29.3 03/26/2018 1029   MCHC 32.8 03/26/2018 1029   RDW 14.2 03/26/2018 1029   LYMPHSABS 1.8 03/26/2018 1029   MONOABS 0.4 03/26/2018 1029   EOSABS 0.4 03/26/2018 1029   BASOSABS 0.0 03/26/2018 1029    . CMP Latest Ref Rng & Units 03/26/2018 12/25/2017 06/13/2017  Glucose 70 - 140 mg/dL 83 93  77  BUN 7 - 26 mg/dL 8 8 10   Creatinine 0.60 - 1.10 mg/dL 0.81 0.87 0.82  Sodium 136 - 145 mmol/L 140 140 137  Potassium 3.5 - 5.1 mmol/L 4.0 3.8 4.2  Chloride 98 - 109 mmol/L 105 105 103  CO2 22 - 29 mmol/L 26 26 24   Calcium 8.4 - 10.4 mg/dL 9.4 9.8 9.4  Total Protein 6.4 - 8.3 g/dL 7.3 7.8 -  Total Bilirubin 0.2 - 1.2 mg/dL 0.3 0.3 -  Alkaline Phos 40 - 150 U/L 99 116 -  AST 5 - 34 U/L 18 16 -  ALT 0 - 55 U/L 20 19 -    Component     Latest Ref Rng & Units 12/25/2017  Color, Urine     YELLOW YELLOW  Appearance     CLEAR CLEAR  Specific Gravity, Urine     1.005 - 1.030 1.018  pH     5.0 - 8.0 6.0  Glucose     NEGATIVE mg/dL NEGATIVE  Hgb urine dipstick     NEGATIVE NEGATIVE  Bilirubin Urine     NEGATIVE NEGATIVE  Ketones, ur     NEGATIVE mg/dL NEGATIVE  Protein     NEGATIVE mg/dL NEGATIVE  Nitrite     NEGATIVE NEGATIVE  Leukocytes, UA     NEGATIVE NEGATIVE  RBC / HPF     0 - 5 RBC/hpf 0-5  WBC, UA     0 - 5 WBC/hpf 0-5  Bacteria, UA     NONE SEEN NONE SEEN  Squamous Epithelial / LPF     NONE SEEN 0-5 (A)  Mucus      PRESENT  Specimen Description      URINE, CLEAN CATCH . . .  Special Requests      NONE . Marland Kitchen .  Culture      NO GROWTH . . .  Report Status      12/26/2017 FINAL  Retic Ct Pct     0.7 - 2.1 % 1.6  RBC.     3.70 - 5.45 MIL/uL 4.41  Retic Count, Absolute     33.7 - 90.7 K/uL 70.6  HCV Ab     0.0 - 0.9 s/co ratio <0.1  LDH     125 - 245 U/L 207    RADIOGRAPHIC STUDIES: I have personally reviewed the radiological images as listed and agreed with the findings in the report. Ct Abdomen Pelvis W Contrast  Result Date: 03/26/2018 CLINICAL DATA:  Three-month follow-up of enlarged abdominal/pelvic lymph nodes. EXAM: CT ABDOMEN AND PELVIS WITH CONTRAST TECHNIQUE: Multidetector CT imaging of the abdomen and pelvis was performed using the standard protocol following bolus administration of intravenous contrast. CONTRAST:  171mL ISOVUE-300  IOPAMIDOL (ISOVUE-300) INJECTION 61% COMPARISON:  CT scan 12/27/2017 FINDINGS: Lower chest: The lung bases are clear of acute process. No pleural effusion or pulmonary lesions. The heart is normal in size. No pericardial effusion. The distal esophagus and aorta are unremarkable. Hepatobiliary: No focal hepatic lesions or intrahepatic biliary dilatation. The gallbladder is normal. No common bile duct dilatation. Pancreas: No mass, inflammation or ductal dilatation. Spleen: Normal size. Stable small low-attenuation lesion in the medial spleen is likely a benign hemangioma. Stable splenule. Adrenals/Urinary Tract: The adrenal glands are normal in stable. Both kidneys are normal and stable. The bladder is unremarkable. Stomach/Bowel: The stomach, duodenum, small bowel and colon are unremarkable. No acute inflammatory changes, mass lesions or obstructive findings. Low lying cecum deep in the pelvis. The terminal ileum is normal. Vascular/Lymphatic: Stable scattered upper abdominal and retroperitoneal lymph nodes. Index hepato duodenal lymph node on image  number 27 measures 16 mm and previously measured 16.5 mm. Index left-sided retroperitoneal lymph node on image number 48 measures 14.5 mm and is unchanged. Small scattered pelvic lymph nodes are also stable. Right medial external iliac lymph node on image number 74 measures 10.5 mm and is unchanged. Reproductive: Surgically absent. Other: No pelvic mass or adenopathy. No free pelvic fluid collections. No inguinal mass or adenopathy. No abdominal wall hernia or subcutaneous lesions. Musculoskeletal: Stable lumbar fusion hardware. No acute bony findings. IMPRESSION: 1. Stable scattered upper abdominal and retroperitoneal and pelvic lymph nodes. The retroperitoneal nodes appears stable since a prior CT myelogram from 2018. These are likely benign. 2. No acute abdominal/pelvic findings or mass lesions. 3. I would recommend a repeat CT scan in March 2020 which would be a 1  year follow-up from the initial CT scan. Electronically Signed   By: Marijo Sanes M.D.   On: 03/26/2018 15:59      12/17/17   ASSESSMENT & PLAN:   Bonnie Parker is a 47 y.o. caucasian female with   1. Retroperitoneal Lymphadenopathy  As seen on 12/17/17 Lumbar MRI  Initially presented with no palpable cervical, axillary lymph nodes, no enlarged liver or spleen.  Baseline labs WNL   CT CAP 12/27/17 with IMPRESSION: 1. Mild porta hepatis, retroperitoneal and bilateral pelvic lymphadenopathy. Lymphoproliferative disorder not excluded. 2. No thoracic adenopathy. 3.  Aortic Atherosclerosis    PLAN:   -Discussed pt labwork today, 03/26/18; all blood counts and chemistries are normal.  -Discussed 03/26/18 CT Abdomen which revealed Stable scattered upper abdominal and retroperitoneal and pelvic lymph nodes. The retroperitoneal nodes appears stable since a prior CT myelogram from 2018. These are likely benign.  -Will continue to monitor pt with CT Abdomen every 6 months -Carcinoid on the differential as well with GI symptoms   2. Chronic back pain, Osteoporosis -with associated b/l leg pain R>L, lower chest and abdominal tightness -Has undergone 2 spinal surgeries  -Takes Fosamax for osteoporosis -Takes oxycodone prn  3. Hypothyroidism  -On Synthroid -Checks TSH every 6 months with PCP  4. New GI symptoms - 03/28/18 -Current described symptoms do not correlate with stable CT Abdomen form 03/1818 nor normal blood work drawn on 03/26/18.  -Offered pt a referral to GI, she will pursue work up with her PCP for now -Recommend PCP consider GI work up for new symptoms, possibly concerning for ulcer or IBS.  -Endoscopy and colonoscopy might be inidicated -Retroperitoneal lymphadenopathy indicate Infectious process vs inflammatory process vs low-grade lymphoma, which all remain in the differential  -Recommended OTC anti-acid like Prilosec    CT abd/pelvis in 24 weeks RTC with Dr Irene Limbo in 6  months with labs    All of the patients questions were answered with apparent satisfaction. The patient knows to call the clinic with any problems, questions or concerns.   . The total time spent in the appointment was 27 minutes and more than 50% was on counseling and direct patient cares.      Sullivan Lone MD MS AAHIVMS [facility] Hematology/Oncology Physician [facility]  (Office):       [phone] (Work cell):  [phone] (Fax):           [phone]  03/28/2018 2:23 PM  I, Baldwin [place], am acting as a Education administrator for Dr Irene Limbo.   .I have reviewed the above documentation for accuracy and completeness, and I agree with the above. Brunetta Genera MD

## 2018-03-28 ENCOUNTER — Encounter: Payer: Self-pay | Admitting: Hematology

## 2018-03-28 ENCOUNTER — Inpatient Hospital Stay: Payer: BLUE CROSS/BLUE SHIELD | Admitting: Hematology

## 2018-03-28 ENCOUNTER — Telehealth: Payer: Self-pay | Admitting: Hematology

## 2018-03-28 VITALS — BP 150/90 | HR 81 | Temp 98.5°F | Resp 18 | Ht 61.0 in | Wt 161.6 lb

## 2018-03-28 DIAGNOSIS — R197 Diarrhea, unspecified: Secondary | ICD-10-CM

## 2018-03-28 DIAGNOSIS — G8929 Other chronic pain: Secondary | ICD-10-CM

## 2018-03-28 DIAGNOSIS — R599 Enlarged lymph nodes, unspecified: Secondary | ICD-10-CM

## 2018-03-28 DIAGNOSIS — R5383 Other fatigue: Secondary | ICD-10-CM

## 2018-03-28 DIAGNOSIS — K5900 Constipation, unspecified: Secondary | ICD-10-CM

## 2018-03-28 DIAGNOSIS — M810 Age-related osteoporosis without current pathological fracture: Secondary | ICD-10-CM

## 2018-03-28 DIAGNOSIS — M549 Dorsalgia, unspecified: Secondary | ICD-10-CM

## 2018-03-28 DIAGNOSIS — R61 Generalized hyperhidrosis: Secondary | ICD-10-CM

## 2018-03-28 DIAGNOSIS — R110 Nausea: Secondary | ICD-10-CM

## 2018-03-28 DIAGNOSIS — R590 Localized enlarged lymph nodes: Secondary | ICD-10-CM

## 2018-03-28 DIAGNOSIS — R1013 Epigastric pain: Secondary | ICD-10-CM

## 2018-03-28 DIAGNOSIS — R1010 Upper abdominal pain, unspecified: Secondary | ICD-10-CM

## 2018-03-28 DIAGNOSIS — E039 Hypothyroidism, unspecified: Secondary | ICD-10-CM

## 2018-03-28 NOTE — Telephone Encounter (Signed)
Scheduled apt per 6/20 los.  Gave pt AVS and  Calender per los.

## 2018-07-25 DIAGNOSIS — M5136 Other intervertebral disc degeneration, lumbar region: Secondary | ICD-10-CM | POA: Insufficient documentation

## 2018-09-12 ENCOUNTER — Ambulatory Visit (HOSPITAL_COMMUNITY)
Admission: RE | Admit: 2018-09-12 | Discharge: 2018-09-12 | Disposition: A | Discharge status: Home / Self Care | Payer: BLUE CROSS/BLUE SHIELD | Source: Ambulatory Visit | Attending: Hematology | Admitting: Hematology

## 2018-09-12 DIAGNOSIS — R599 Enlarged lymph nodes, unspecified: Secondary | ICD-10-CM | POA: Diagnosis present

## 2018-09-12 MED ORDER — IOHEXOL 300 MG/ML  SOLN
100.0000 mL | Freq: Once | INTRAMUSCULAR | Status: AC | PRN
  Administered 2018-09-12: 100 mL via INTRAVENOUS

## 2018-09-12 MED ORDER — SODIUM CHLORIDE (PF) 0.9 % IJ SOLN
INTRAMUSCULAR | Status: AC
  Filled 2018-09-12: qty 50

## 2018-09-25 NOTE — Progress Notes (Signed)
HEMATOLOGY/ONCOLOGY FOLLOW UP NOTE  Date of Service: 09/26/2018  Patient Care Team: Darrol Jump, PA-C as PCP - General (Family Medicine)  CHIEF COMPLAINTS/PURPOSE OF CONSULTATION:  F/u for Retroperitoneal Lymphadenopathy   HISTORY OF PRESENTING ILLNESS:   Bonnie Parker is a wonderful 47 y.o. female who has been referred to [place] by Dr. Melina Schools, her PCP, for evaluation of Lymphadenopathy as seen in lumbar MRI from 12/17/17.   She presents to the clinic today accompanied by her family member. She was having recent back pain and had a MRI of lumbar spine on 12/17/17 which showed changes in her previous decompression and fusion surgeries along with herniated discs in lumbar spine. She also had a incidental finding of retroperitoneal lymphadenopathy.  Her chronic back pain has been going on since 2003 due to a fall. She had back surgery in 2005 and had a laminectomy in 06/2017. She also notes the pain going into her legs that started before her laminectomy. She notes in the last 2 weeks her night sweats have increased. This started right before her laminectomy after use of blood patch.   She also has osteoporosis, Hypothyroidism, extremity swelling which she takes HCTZ for. She is a non-smoker, and rarely drinks. She had a partial hysterectomy in 1999 due to significant endometriosis. She had an oophorectomy in 2002. She denies any pelvic pain since. She denies recent history of infection, cough or cold or prior cancer diagnosis. She notes her mother had cervical cancer. She notes her mother has a blood disorder, cryoglobulin anemia which is now in remission. She was given antibiotics recently in case of UTI in 07/2017. She had a pelvic scan as well at that time.   On review of symptoms, pt chronic back pain, leg pain R>L, recent and increases night sweats, occasional chills, swelling in her hands and feet. She notes she lost 10 pounds but due to use of diet pill, phentermine. She has been  taking phentermine for the past few months. She notes low chest tightness due to back pain recently. She notes constipation that she contributes to pain medication use. She denies recent cough, cold, or infection.     INTERVAL HISTORY   Bonnie Parker is here for a follow up regarding her retroperitoneal lymphadenopathy. The patient's last visit with [place] was on 03/28/18. She is accompanied today by her husband. The pt reports that she is doing well overall.   The pt reports that she has had some back pains in the interim, which she believes is similar to previous back pains she has had. She denies any blood in the stools, abdominal pains, nausea, vomiting and diarrhea. She denies any chest wall pain, SOB or other chest pain.   Of note since the patient's last visit, pt has had a CT A/P completed on 09/12/18 with results revealing Stable mild abdominal and pelvic lymphadenopathy. Stable small indeterminate splenic lesions.  Lab results today (09/26/18) of CBC w/diff is as follows: all values are WNL. 09/26/18 CMP and LDH are pending  On review of systems, pt reports back pain, good energy levels, eating well, and denies fevers, chills, night sweats, abdominal pains, nausea, vomiting, diarrhea, blood in the stools, black stools, change in urination habits, abdomen discomfort, changes in bowel habits, noticing any new lumps or bumps, new skin rashes, itching, leg swelling, and any other symptoms.   MEDICAL HISTORY:  Past Medical History:  Diagnosis Date  . Anxiety   . Arthritis   . Asthma   .  Depression   . Hypothyroidism   . Lumbar spinal stenosis     SURGICAL HISTORY: Past Surgical History:  Procedure Laterality Date  . ABDOMINAL HYSTERECTOMY  1999  . BACK SURGERY  2005   Dr. Ronnald Ramp  . INNER EAR SURGERY    . LUMBAR LAMINECTOMY/DECOMPRESSION MICRODISCECTOMY N/A 06/21/2017   Procedure: Lumbar decompression L3-4 ;  Surgeon: Melina Schools, MD;  Location: [facility];  Service: Orthopedics;   Laterality: N/A;  3 hrs    SOCIAL HISTORY: Social History   Socioeconomic History  . Marital status: Married    Spouse name: Not on file  . Number of children: Not on file  . Years of education: Not on file  . Highest education level: Not on file  Occupational History  . Not on file  Social Needs  . Financial resource strain: Not on file  . Food insecurity:    Worry: Not on file    Inability: Not on file  . Transportation needs:    Medical: Not on file    Non-medical: Not on file  Tobacco Use  . Smoking status: Never Smoker  . Smokeless tobacco: Never Used  Substance and Sexual Activity  . Alcohol use: No  . Drug use: No  . Sexual activity: Not on file  Lifestyle  . Physical activity:    Days per week: Not on file    Minutes per session: Not on file  . Stress: Not on file  Relationships  . Social connections:    Talks on phone: Not on file    Gets together: Not on file    Attends religious service: Not on file    Active member of club or organization: Not on file    Attends meetings of clubs or organizations: Not on file    Relationship status: Not on file  . Intimate partner violence:    Fear of current or ex partner: Not on file    Emotionally abused: Not on file    Physically abused: Not on file    Forced sexual activity: Not on file  Other Topics Concern  . Not on file  Social History Narrative  . Not on file    FAMILY HISTORY: No family history on file.  ALLERGIES:  is allergic to adhesive [tape].  MEDICATIONS:  Current Outpatient Medications  Medication Sig Dispense Refill  . alendronate (FOSAMAX) 70 MG tablet Take 70 mg by mouth every Monday. Take with a full glass of water on an empty stomach.    Marland Kitchen atorvastatin (LIPITOR) 80 MG tablet Take 80 mg by mouth daily.    . Buprenorphine HCl (BELBUCA) 300 MCG FILM Place inside cheek 2 (two) times daily.    Marland Kitchen levothyroxine (SYNTHROID, LEVOTHROID) 50 MCG tablet Take 50 mcg by mouth daily before breakfast.     . methocarbamol (ROBAXIN) 500 MG tablet Take 1 tablet (500 mg total) by mouth 3 (three) times daily as needed for muscle spasms. 21 tablet 0  . oxyCODONE-acetaminophen (PERCOCET) 10-325 MG tablet Take 1 tablet by mouth every 4 (four) hours as needed for pain. 30 tablet 0  . phentermine 37.5 MG capsule Take 37.5 mg by mouth every morning.    . sertraline (ZOLOFT) 100 MG tablet Take 100 mg by mouth daily.    Marland Kitchen triamcinolone cream (KENALOG) 0.1 % Apply 1 application topically as needed.    . ALPRAZolam (XANAX) 0.5 MG tablet Take 0.5 mg by mouth daily as needed for anxiety.    . ondansetron (ZOFRAN) 4  MG tablet Take 1 tablet (4 mg total) by mouth every 8 (eight) hours as needed for nausea or vomiting. (Patient not taking: Reported on 09/26/2018) 20 tablet 0   No current facility-administered medications for this visit.     REVIEW OF SYSTEMS:    A 10+ POINT REVIEW OF SYSTEMS WAS OBTAINED including neurology, dermatology, psychiatry, cardiac, respiratory, lymph, extremities, GI, GU, Musculoskeletal, constitutional, breasts, reproductive, HEENT.  All pertinent positives are noted in the HPI.  All others are negative.   PHYSICAL EXAMINATION: ECOG PERFORMANCE STATUS: 1 - Symptomatic but completely ambulatory  . Vitals:   09/26/18 1513  BP: 136/84  Pulse: 81  Resp: 16  Temp: 98.4 F (36.9 C)  SpO2: 97%   Filed Weights   09/26/18 1513  Weight: 169 lb 6.4 oz (76.8 kg)   .Body mass index is 32.01 kg/m.  GENERAL:alert, in no acute distress and comfortable SKIN: no acute rashes, no significant lesions EYES: conjunctiva are pink and non-injected, sclera anicteric OROPHARYNX: MMM, no exudates, no oropharyngeal erythema or ulceration NECK: supple, no JVD LYMPH:  no palpable lymphadenopathy in the cervical, axillary or inguinal regions LUNGS: clear to auscultation b/l with normal respiratory effort HEART: regular rate & rhythm ABDOMEN:  normoactive bowel sounds , non tender, not distended.  No palpable hepatosplenomegaly.  Extremity: no pedal edema PSYCH: alert & oriented x 3 with fluent speech NEURO: no focal motor/sensory deficits    LABORATORY DATA:  I have reviewed the data as listed  . CBC Latest Ref Rng & Units 09/26/2018 03/26/2018 12/25/2017  WBC 4.0 - 10.5 K/uL 6.0 6.2 6.1  Hemoglobin 12.0 - 15.0 g/dL 12.8 12.8 12.8  Hematocrit 36.0 - 46.0 % 40.4 39.0 39.3  Platelets 150 - 400 K/uL 310 303 281   . CBC    Component Value Date/Time   WBC 6.0 09/26/2018 1440   RBC 4.44 09/26/2018 1440   HGB 12.8 09/26/2018 1440   HCT 40.4 09/26/2018 1440   PLT 310 09/26/2018 1440   MCV 91.0 09/26/2018 1440   MCH 28.8 09/26/2018 1440   MCHC 31.7 09/26/2018 1440   RDW 13.5 09/26/2018 1440   LYMPHSABS 1.8 09/26/2018 1440   MONOABS 0.5 09/26/2018 1440   EOSABS 0.4 09/26/2018 1440   BASOSABS 0.1 09/26/2018 1440    . CMP Latest Ref Rng & Units 03/26/2018 12/25/2017 06/13/2017  Glucose 70 - 140 mg/dL 83 93 77  BUN 7 - 26 mg/dL 8 8 10   Creatinine 0.60 - 1.10 mg/dL 0.81 0.87 0.82  Sodium 136 - 145 mmol/L 140 140 137  Potassium 3.5 - 5.1 mmol/L 4.0 3.8 4.2  Chloride 98 - 109 mmol/L 105 105 103  CO2 22 - 29 mmol/L 26 26 24   Calcium 8.4 - 10.4 mg/dL 9.4 9.8 9.4  Total Protein 6.4 - 8.3 g/dL 7.3 7.8 -  Total Bilirubin 0.2 - 1.2 mg/dL 0.3 0.3 -  Alkaline Phos 40 - 150 U/L 99 116 -  AST 5 - 34 U/L 18 16 -  ALT 0 - 55 U/L 20 19 -    Component     Latest Ref Rng & Units 12/25/2017  Color, Urine     YELLOW YELLOW  Appearance     CLEAR CLEAR  Specific Gravity, Urine     1.005 - 1.030 1.018  pH     5.0 - 8.0 6.0  Glucose     NEGATIVE mg/dL NEGATIVE  Hgb urine dipstick     NEGATIVE NEGATIVE  Bilirubin Urine  NEGATIVE NEGATIVE  Ketones, ur     NEGATIVE mg/dL NEGATIVE  Protein     NEGATIVE mg/dL NEGATIVE  Nitrite     NEGATIVE NEGATIVE  Leukocytes, UA     NEGATIVE NEGATIVE  RBC / HPF     0 - 5 RBC/hpf 0-5  WBC, UA     0 - 5 WBC/hpf 0-5  Bacteria, UA     NONE  SEEN NONE SEEN  Squamous Epithelial / LPF     NONE SEEN 0-5 (A)  Mucus      PRESENT  Specimen Description      URINE, CLEAN CATCH . . .  Special Requests      NONE . Marland Kitchen .  Culture      NO GROWTH . . .  Report Status      12/26/2017 FINAL  Retic Ct Pct     0.7 - 2.1 % 1.6  RBC.     3.70 - 5.45 MIL/uL 4.41  Retic Count, Absolute     33.7 - 90.7 K/uL 70.6  HCV Ab     0.0 - 0.9 s/co ratio <0.1  LDH     125 - 245 U/L 207    RADIOGRAPHIC STUDIES: I have personally reviewed the radiological images as listed and agreed with the findings in the report. Ct Abdomen Pelvis W Contrast  Result Date: 09/12/2018 CLINICAL DATA:  Follow-up abdominal and pelvic lymphadenopathy. EXAM: CT ABDOMEN AND PELVIS WITH CONTRAST TECHNIQUE: Multidetector CT imaging of the abdomen and pelvis was performed using the standard protocol following bolus administration of intravenous contrast. CONTRAST:  169mL OMNIPAQUE IOHEXOL 300 MG/ML  SOLN COMPARISON:  03/26/2018 and 12/27/2017 FINDINGS: Lower Chest: No acute findings. Hepatobiliary: No hepatic masses identified. Gallbladder is unremarkable. Pancreas:  No mass or inflammatory changes. Spleen: No evidence of splenomegaly. Several small nonspecific low-attenuation splenic lesions show no significant change. Adrenals/Urinary Tract: No masses identified. No evidence of hydronephrosis. Unremarkable unopacified urinary bladder. Stomach/Bowel: No evidence of obstruction, inflammatory process or abnormal fluid collections. Vascular/Lymphatic: Mild lymphadenopathy in the porta hepatis and portacaval space remains stable, with largest lymph node measuring 1.5 cm on image 26/2. Mild retroperitoneal lymphadenopathy is also stable, largest lymph node in the left paraaortic region measuring 1.6 cm on image 47/2. Mild bilateral iliac lymphadenopathy is also unchanged, with index node in the right external iliac chain measuring 11 mm. No new or increased lymphadenopathy identified. No  abdominal aortic aneurysm. Aortic atherosclerosis. Reproductive: Prior hysterectomy noted. Adnexal regions are unremarkable in appearance. Other:  None. Musculoskeletal: No suspicious bone lesions identified. Prior PLIF again noted at L4-5 and L5-S1. IMPRESSION: Stable mild abdominal and pelvic lymphadenopathy. Stable small indeterminate splenic lesions. Recommend continued follow-up by CT in 6 months. This recommendation follows ACR consensus guidelines: White Paper of the ACR Incidental Findings Committee II on Splenic and Nodal Findings. [place] [phone]. Electronically Signed   By: Earle Gell M.D.   On: 09/12/2018 15:27      12/17/17   ASSESSMENT & PLAN:   Bonnie Parker is a 47 y.o. caucasian female with   1. Retroperitoneal Lymphadenopathy  As seen on 12/17/17 Lumbar MRI  Initially presented with no palpable cervical, axillary lymph nodes, no enlarged liver or spleen.  Baseline labs WNL   12/27/17 CT C/A/P revealed 1. Mild porta hepatis, retroperitoneal and bilateral pelvic lymphadenopathy. Lymphoproliferative disorder not excluded. 2. No thoracic adenopathy. 3.  Aortic Atherosclerosis   03/26/18 CT Abdomen revealed Stable scattered upper abdominal and retroperitoneal and  pelvic lymph nodes. The retroperitoneal nodes appears stable since a prior CT myelogram from 2018. These are likely benign.   2. Chronic back pain, Osteoporosis -with associated b/l leg pain R>L, lower chest and abdominal tightness -Has undergone 2 spinal surgeries  -Takes Fosamax for osteoporosis -Takes oxycodone prn  3. Hypothyroidism  -On Synthroid -Checks TSH every 6 months with PCP  4. New GI symptoms - 03/28/18 -Current described symptoms do not correlate with stable CT Abdomen form 03/1818 nor normal blood work drawn on 03/26/18.  -Offered pt a referral to GI, she will pursue work up with her PCP for now -Recommend PCP consider GI work up for new symptoms, possibly concerning for ulcer or  IBS.  -Endoscopy and colonoscopy might be inidicated -Retroperitoneal lymphadenopathy indicate Infectious process vs inflammatory process vs low-grade lymphoma, which all remain in the differential  -Recommended OTC anti-acid like Prilosec   PLAN:   -Discussed pt labwork today, 09/26/18; blood counts are normal -Discussed the 09/12/18 CT A/P which revealed Stable mild abdominal and pelvic lymphadenopathy. Stable small indeterminate splenic lesions. -Patient's lymphadenopathy has not changed much if any over a year, which rules out more concerning pathologies  -Discussed the option to consider watchful observation or referring the patient for a biopsy which is not strongly indicated  -Will repeat imaging in 12 months, unless clinically indicated before then -Will see the pt back in 6 months   RTC with Dr Irene Limbo with labs in 6 months     All of the patients questions were answered with apparent satisfaction. The patient knows to call the clinic with any problems, questions or concerns.   The total time spent in the appt was 20 minutes and more than 50% was on counseling and direct patient cares.     Sullivan Lone MD MS AAHIVMS [facility] Hematology/Oncology Physician [facility]  (Office):       [phone] (Work cell):  [phone] (Fax):           [phone]  09/26/2018 3:29 PM  I, Baldwin Jamaica, am acting as a scribe for Dr. Sullivan Lone.   .I have reviewed the above documentation for accuracy and completeness, and I agree with the above. Brunetta Genera MD

## 2018-09-26 ENCOUNTER — Inpatient Hospital Stay: Payer: BLUE CROSS/BLUE SHIELD

## 2018-09-26 ENCOUNTER — Inpatient Hospital Stay: Payer: BLUE CROSS/BLUE SHIELD | Attending: Hematology | Admitting: Hematology

## 2018-09-26 ENCOUNTER — Telehealth: Payer: Self-pay | Admitting: Hematology

## 2018-09-26 VITALS — BP 136/84 | HR 81 | Temp 98.4°F | Resp 16 | Wt 169.4 lb

## 2018-09-26 DIAGNOSIS — E039 Hypothyroidism, unspecified: Secondary | ICD-10-CM

## 2018-09-26 DIAGNOSIS — M810 Age-related osteoporosis without current pathological fracture: Secondary | ICD-10-CM

## 2018-09-26 DIAGNOSIS — Z90710 Acquired absence of both cervix and uterus: Secondary | ICD-10-CM | POA: Insufficient documentation

## 2018-09-26 DIAGNOSIS — R599 Enlarged lymph nodes, unspecified: Secondary | ICD-10-CM

## 2018-09-26 DIAGNOSIS — Z79899 Other long term (current) drug therapy: Secondary | ICD-10-CM

## 2018-09-26 DIAGNOSIS — R590 Localized enlarged lymph nodes: Secondary | ICD-10-CM | POA: Diagnosis present

## 2018-09-26 LAB — CBC WITH DIFFERENTIAL/PLATELET
Abs Immature Granulocytes: 0.01 10*3/uL (ref 0.00–0.07)
Basophils Absolute: 0.1 10*3/uL (ref 0.0–0.1)
Basophils Relative: 1 %
Eosinophils Absolute: 0.4 10*3/uL (ref 0.0–0.5)
Eosinophils Relative: 6 %
HCT: 40.4 % (ref 36.0–46.0)
HEMOGLOBIN: 12.8 g/dL (ref 12.0–15.0)
Immature Granulocytes: 0 %
Lymphocytes Relative: 31 %
Lymphs Abs: 1.8 10*3/uL (ref 0.7–4.0)
MCH: 28.8 pg (ref 26.0–34.0)
MCHC: 31.7 g/dL (ref 30.0–36.0)
MCV: 91 fL (ref 80.0–100.0)
MONO ABS: 0.5 10*3/uL (ref 0.1–1.0)
MONOS PCT: 9 %
Neutro Abs: 3.2 10*3/uL (ref 1.7–7.7)
Neutrophils Relative %: 53 %
Platelets: 310 10*3/uL (ref 150–400)
RBC: 4.44 MIL/uL (ref 3.87–5.11)
RDW: 13.5 % (ref 11.5–15.5)
WBC: 6 10*3/uL (ref 4.0–10.5)
nRBC: 0 % (ref 0.0–0.2)

## 2018-09-26 LAB — COMPREHENSIVE METABOLIC PANEL
ALT: 26 U/L (ref 0–44)
AST: 26 U/L (ref 15–41)
Albumin: 4 g/dL (ref 3.5–5.0)
Alkaline Phosphatase: 71 U/L (ref 38–126)
Anion gap: 11 (ref 5–15)
BUN: 10 mg/dL (ref 6–20)
CALCIUM: 8.9 mg/dL (ref 8.9–10.3)
CO2: 22 mmol/L (ref 22–32)
Chloride: 107 mmol/L (ref 98–111)
Creatinine, Ser: 0.87 mg/dL (ref 0.44–1.00)
GFR calc Af Amer: >60 mL/min (ref >60)
Glucose, Bld: 116 mg/dL — ABNORMAL HIGH (ref 70–99)
Potassium: 3.7 mmol/L (ref 3.5–5.1)
Sodium: 140 mmol/L (ref 135–145)
Total Bilirubin: 0.4 mg/dL (ref 0.3–1.2)
Total Protein: 6.6 g/dL (ref 6.5–8.1)

## 2018-09-26 NOTE — Telephone Encounter (Signed)
Scheduled appt per 12/19 los - gave patient aVS And calender per los.

## 2018-09-27 LAB — LACTATE DEHYDROGENASE: LDH: 200 U/L — AB (ref 98–192)

## 2018-12-20 ENCOUNTER — Ambulatory Visit: Payer: Self-pay | Admitting: Orthopedic Surgery

## 2018-12-25 ENCOUNTER — Telehealth: Payer: Self-pay | Admitting: Hematology

## 2018-12-25 NOTE — Telephone Encounter (Signed)
Tried to reach regarding vociemail, I did leave a message on the home number. Patient has appointments

## 2018-12-30 ENCOUNTER — Inpatient Hospital Stay (HOSPITAL_COMMUNITY): Admission: RE | Admit: 2018-12-30 | Payer: BLUE CROSS/BLUE SHIELD | Source: Ambulatory Visit

## 2019-01-01 ENCOUNTER — Telehealth: Payer: Self-pay | Admitting: *Deleted

## 2019-01-01 NOTE — Telephone Encounter (Signed)
Received call and fax from [facility] requesting earlier appt for patient (currently scheduled in June).  Dr. Irene Limbo reviewed faxed scan results and asked that patient be contacted to make earlier appt. Attempted to contact patient x2 @ [phone]. Left voice mail each time with info that Dr. Irene Limbo would like to see soon, earlier than June. Asked patient to call scheduling and/or Dr. Irene Limbo office for further information. Sent msg to scheduling.

## 2019-01-02 ENCOUNTER — Telehealth: Payer: Self-pay | Admitting: *Deleted

## 2019-01-02 NOTE — Telephone Encounter (Signed)
Patient called to ask if Dr. Irene Limbo had received results of scan from her PCP and if she would be scheduled for an appt. Advised her that Dr. Irene Limbo had seen results and that copy of MRI as requested from [facility]. Msg already sent to scheduling and she can expect call from them r/t appt. Patient verbalized understanding.

## 2019-01-03 ENCOUNTER — Telehealth: Payer: Self-pay | Admitting: Hematology

## 2019-01-03 NOTE — Telephone Encounter (Signed)
R/s appt from June to sooner date per sch message - pt aware of apt date and time

## 2019-01-06 NOTE — Progress Notes (Signed)
HEMATOLOGY/ONCOLOGY FOLLOW UP NOTE  Date of Service: 01/07/2019  Patient Care Team: Darrol Jump, PA-C as PCP - General (Family Medicine)  CHIEF COMPLAINTS/PURPOSE OF CONSULTATION:  F/u for Retroperitoneal Lymphadenopathy   HISTORY OF PRESENTING ILLNESS:   Bonnie Parker is a wonderful 48 y.o. female who has been referred to [place] by Dr. Melina Schools, her PCP, for evaluation of Lymphadenopathy as seen in lumbar MRI from 12/17/17.   She presents to the clinic today accompanied by her family member. She was having recent back pain and had a MRI of lumbar spine on 12/17/17 which showed changes in her previous decompression and fusion surgeries along with herniated discs in lumbar spine. She also had a incidental finding of retroperitoneal lymphadenopathy.  Her chronic back pain has been going on since 2003 due to a fall. She had back surgery in 2005 and had a laminectomy in 06/2017. She also notes the pain going into her legs that started before her laminectomy. She notes in the last 2 weeks her night sweats have increased. This started right before her laminectomy after use of blood patch.   She also has osteoporosis, Hypothyroidism, extremity swelling which she takes HCTZ for. She is a non-smoker, and rarely drinks. She had a partial hysterectomy in 1999 due to significant endometriosis. She had an oophorectomy in 2002. She denies any pelvic pain since. She denies recent history of infection, cough or cold or prior cancer diagnosis. She notes her mother had cervical cancer. She notes her mother has a blood disorder, cryoglobulin anemia which is now in remission. She was given antibiotics recently in case of UTI in 07/2017. She had a pelvic scan as well at that time.   On review of symptoms, pt chronic back pain, leg pain R>L, recent and increases night sweats, occasional chills, swelling in her hands and feet. She notes she lost 10 pounds but due to use of diet pill, phentermine. She has been  taking phentermine for the past few months. She notes low chest tightness due to back pain recently. She notes constipation that she contributes to pain medication use. She denies recent cough, cold, or infection.   INTERVAL HISTORY   SOYLA Parker is here for a follow up regarding her retroperitoneal lymphadenopathy. The patient's last visit with [place] was on 09/26/18. The pt reports that she is doing well overall.   The pt reports that she has been feeling more tired for the last 2 months and is now needing to take naps. She denies changes in medications. The pt notes that she doesn't sleep well at night, which is not a change. The pt notes that she does not feel refreshed after an afternoon nap.  She notes that she had a recent MRI Thoracic Spine for evaluation of a spinal cord stimulator. The pt notes that she is having "a little different" pain in her mid back, which she characterizes as muscular. She denies fevers, chills, unexpected weight loss. She notes that she has sweats when she sleeps. She denies changes in bowel habits or urination, she denies vaginal bleeding or discharge. The pt denies changes in breathing or abdominal pain, however she does endorse lower abdominal tenderness.  The pt denies other new symptoms, but notes that her chronic groin pain has continued which she associates with her lower back pain.  The pt notes that her Vitamin D was recently checked with her PCP and was normal.  Lab results today (01/07/19) of CBC w/diff and CMP is  as follows: all values are WNL except for Total Bilirubin at 0.2. 01/07/19 LDH, Sed Rate, MMP and SFLC are pending  On review of systems, pt reports feeling more tired, mid back pain, sweats when sleeping, lower back pain, groin pain, and denies changes in bowel habits, changes in urination, fevers, chill, unexpected weight loss, problems swallowing, changes in breathing, abdominal pains, and any other symptoms.  MEDICAL HISTORY:  Past  Medical History:  Diagnosis Date  . Anxiety   . Arthritis   . Asthma   . Depression   . Hypothyroidism   . Lumbar spinal stenosis     SURGICAL HISTORY: Past Surgical History:  Procedure Laterality Date  . ABDOMINAL HYSTERECTOMY  1999  . BACK SURGERY  2005   Dr. Ronnald Ramp  . INNER EAR SURGERY    . LUMBAR LAMINECTOMY/DECOMPRESSION MICRODISCECTOMY N/A 06/21/2017   Procedure: Lumbar decompression L3-4 ;  Surgeon: Melina Schools, MD;  Location: [facility];  Service: Orthopedics;  Laterality: N/A;  3 hrs    SOCIAL HISTORY: Social History   Socioeconomic History  . Marital status: Married    Spouse name: Not on file  . Number of children: Not on file  . Years of education: Not on file  . Highest education level: Not on file  Occupational History  . Not on file  Social Needs  . Financial resource strain: Not on file  . Food insecurity:    Worry: Not on file    Inability: Not on file  . Transportation needs:    Medical: Not on file    Non-medical: Not on file  Tobacco Use  . Smoking status: Never Smoker  . Smokeless tobacco: Never Used  Substance and Sexual Activity  . Alcohol use: No  . Drug use: No  . Sexual activity: Not on file  Lifestyle  . Physical activity:    Days per week: Not on file    Minutes per session: Not on file  . Stress: Not on file  Relationships  . Social connections:    Talks on phone: Not on file    Gets together: Not on file    Attends religious service: Not on file    Active member of club or organization: Not on file    Attends meetings of clubs or organizations: Not on file    Relationship status: Not on file  . Intimate partner violence:    Fear of current or ex partner: Not on file    Emotionally abused: Not on file    Physically abused: Not on file    Forced sexual activity: Not on file  Other Topics Concern  . Not on file  Social History Narrative  . Not on file    FAMILY HISTORY: No family history on file.  ALLERGIES:  is allergic  to adhesive [tape].  MEDICATIONS:  Current Outpatient Medications  Medication Sig Dispense Refill  . alendronate (FOSAMAX) 70 MG tablet Take 70 mg by mouth every Monday. Take with a full glass of water on an empty stomach.    . Buprenorphine HCl (BELBUCA) 450 MCG FILM Place inside cheek 2 (two) times daily.     Marland Kitchen buPROPion (WELLBUTRIN XL) 300 MG 24 hr tablet Take 300 mg by mouth daily.    Marland Kitchen ezetimibe (ZETIA) 10 MG tablet Take 10 mg by mouth daily.    Marland Kitchen levothyroxine (SYNTHROID, LEVOTHROID) 50 MCG tablet Take 50 mcg by mouth daily before breakfast.    . methocarbamol (ROBAXIN) 500 MG tablet Take 1  tablet (500 mg total) by mouth 3 (three) times daily as needed for muscle spasms. 21 tablet 0  . ondansetron (ZOFRAN) 4 MG tablet Take 1 tablet (4 mg total) by mouth every 8 (eight) hours as needed for nausea or vomiting. 20 tablet 0  . oxyCODONE-acetaminophen (PERCOCET) 10-325 MG tablet Take 1 tablet by mouth every 4 (four) hours as needed for pain. 30 tablet 0  . pantoprazole (PROTONIX) 40 MG tablet Take 40 mg by mouth daily.    . sertraline (ZOLOFT) 100 MG tablet Take 100 mg by mouth daily.    Marland Kitchen ALPRAZolam (XANAX) 0.5 MG tablet Take 0.5 mg by mouth daily as needed for anxiety.    Marland Kitchen atorvastatin (LIPITOR) 80 MG tablet Take 80 mg by mouth daily.    . phentermine 37.5 MG capsule Take 37.5 mg by mouth every morning.    . triamcinolone cream (KENALOG) 0.1 % Apply 1 application topically as needed.     No current facility-administered medications for this visit.     REVIEW OF SYSTEMS:    A 10+ POINT REVIEW OF SYSTEMS WAS OBTAINED including neurology, dermatology, psychiatry, cardiac, respiratory, lymph, extremities, GI, GU, Musculoskeletal, constitutional, breasts, reproductive, HEENT.  All pertinent positives are noted in the HPI.  All others are negative.   PHYSICAL EXAMINATION: ECOG PERFORMANCE STATUS: 1 - Symptomatic but completely ambulatory  . Vitals:   01/07/19 1425  BP: 131/89  Pulse:  83  Resp: 18  Temp: 98.5 F (36.9 C)  SpO2: 98%   Filed Weights   01/07/19 1425  Weight: 162 lb 4.8 oz (73.6 kg)   .Body mass index is 30.67 kg/m.  GENERAL:alert, in no acute distress and comfortable SKIN: no acute rashes, no significant lesions EYES: conjunctiva are pink and non-injected, sclera anicteric OROPHARYNX: MMM, no exudates, no oropharyngeal erythema or ulceration NECK: supple, no JVD LYMPH:  no palpable lymphadenopathy in the cervical, axillary or inguinal regions LUNGS: clear to auscultation b/l with normal respiratory effort HEART: regular rate & rhythm ABDOMEN:  normoactive bowel sounds , non tender, not distended. No palpable hepatosplenomegaly.  Extremity: no pedal edema PSYCH: alert & oriented x 3 with fluent speech NEURO: no focal motor/sensory deficits   LABORATORY DATA:  I have reviewed the data as listed  . CBC Latest Ref Rng & Units 01/07/2019 09/26/2018 03/26/2018  WBC 4.0 - 10.5 K/uL 6.2 6.0 6.2  Hemoglobin 12.0 - 15.0 g/dL 13.3 12.8 12.8  Hematocrit 36.0 - 46.0 % 41.8 40.4 39.0  Platelets 150 - 400 K/uL 313 310 303   . CBC    Component Value Date/Time   WBC 6.2 01/07/2019 1402   RBC 4.73 01/07/2019 1402   HGB 13.3 01/07/2019 1402   HCT 41.8 01/07/2019 1402   PLT 313 01/07/2019 1402   MCV 88.4 01/07/2019 1402   MCH 28.1 01/07/2019 1402   MCHC 31.8 01/07/2019 1402   RDW 13.3 01/07/2019 1402   LYMPHSABS 2.3 01/07/2019 1402   MONOABS 0.4 01/07/2019 1402   EOSABS 0.4 01/07/2019 1402   BASOSABS 0.1 01/07/2019 1402    . CMP Latest Ref Rng & Units 01/07/2019 01/07/2019 09/26/2018  Glucose 70 - 99 mg/dL 86 - 116(H)  BUN 6 - 20 mg/dL 8 - 10  Creatinine 0.44 - 1.00 mg/dL 0.93 - 0.87  Sodium 135 - 145 mmol/L 140 - 140  Potassium 3.5 - 5.1 mmol/L 4.1 - 3.7  Chloride 98 - 111 mmol/L 104 - 107  CO2 22 - 32 mmol/L 24 - 22  Calcium 8.7 - 10.2 mg/dL 9.7 9.4 8.9  Total Protein 6.5 - 8.1 g/dL 8.1 - 6.6  Total Bilirubin 0.3 - 1.2 mg/dL 0.2(L) - 0.4   Alkaline Phos 38 - 126 U/L 104 - 71  AST 15 - 41 U/L 21 - 26  ALT 0 - 44 U/L 20 - 26    . Lab Results  Component Value Date   LDH 185 01/07/2019     RADIOGRAPHIC STUDIES: I have personally reviewed the radiological images as listed and agreed with the findings in the report. [facility]  Result Date: 01/07/2019 This examination belongs to an outside facility and is stored here for comparison purposes only.  Contact the originating outside institution for any associated report or interpretation.     12/17/17   ASSESSMENT & PLAN:   NERI VIEYRA is a 48 y.o. caucasian female with   1. Retroperitoneal Lymphadenopathy and now with innumerable bone lesions noted on MRI spine-- concerning for occult metastatic malignancy.  12/27/17 CT C/A/P revealed 1. Mild porta hepatis, retroperitoneal and bilateral pelvic lymphadenopathy. Lymphoproliferative disorder not excluded. 2. No thoracic adenopathy. 3.  Aortic Atherosclerosis   03/26/18 CT Abdomen revealed Stable scattered upper abdominal and retroperitoneal and pelvic lymph nodes. The retroperitoneal nodes appears stable since a prior CT myelogram from 2018. These are likely benign.   09/12/18 CT A/P revealed Stable mild abdominal and pelvic lymphadenopathy. Stable small indeterminate splenic lesions.  2. Chronic back pain, Osteoporosis -with associated b/l leg pain R>L, lower chest and abdominal tightness -Has undergone 2 spinal surgeries  -Takes Fosamax for osteoporosis -Takes oxycodone prn  3. Hypothyroidism  -On Synthroid -Checks TSH every 6 months with PCP   PLAN:  -Discussed pt labwork today, 01/07/19; blood counts and chemistries are normal -01/07/19 LDH, Sed Rate, MMP and SFLC are pending -Discussed the outside record 12/13/18 MRI Thoracic spine which identified innumerable bone lesions, and could not rule out bone mets or myeloma. Also noted persisting retroperitoneal lymphadenopathy, but comparison to previous  imaging was not included. -Given patient's medical history of DDD, osteoporosis, and scoliosis, it is difficult to evaluate if her lesions are malignant or benign -Will order PET/CT for further characterization of these lesions, and to evaluate for a possible primary -Recommend Vitamin D replacement to 60-90 for bone health optimization -Will see the pt back in 2 weeks   Additional labs today PET/CT in 1 week Webex video or phone visit in 2 weeks   All of the patients questions were answered with apparent satisfaction. The patient knows to call the clinic with any problems, questions or concerns.   The total time spent in the appt was 30 minutes and more than 50% was on counseling and direct patient cares.    Sullivan Lone MD MS AAHIVMS [facility] Hematology/Oncology Physician [facility]  (Office):       [phone] (Work cell):  [phone] (Fax):           [phone]  01/07/2019 3:17 PM  I, Baldwin Jamaica, am acting as a scribe for Dr. Sullivan Lone.   .I have reviewed the above documentation for accuracy and completeness, and I agree with the above .Brunetta Genera MD

## 2019-01-07 ENCOUNTER — Other Ambulatory Visit: Payer: Self-pay

## 2019-01-07 ENCOUNTER — Inpatient Hospital Stay: Payer: BLUE CROSS/BLUE SHIELD | Admitting: Hematology

## 2019-01-07 ENCOUNTER — Ambulatory Visit: Payer: BLUE CROSS/BLUE SHIELD

## 2019-01-07 ENCOUNTER — Other Ambulatory Visit (HOSPITAL_COMMUNITY): Payer: Self-pay | Admitting: Hematology

## 2019-01-07 ENCOUNTER — Inpatient Hospital Stay: Payer: BLUE CROSS/BLUE SHIELD | Attending: Hematology

## 2019-01-07 ENCOUNTER — Ambulatory Visit
Admission: RE | Admit: 2019-01-07 | Discharge: 2019-01-07 | Disposition: A | Discharge status: Home / Self Care | Payer: Self-pay | Source: Ambulatory Visit | Attending: Hematology | Admitting: Hematology

## 2019-01-07 VITALS — BP 131/89 | HR 83 | Temp 98.5°F | Resp 18 | Ht 61.0 in | Wt 162.3 lb

## 2019-01-07 DIAGNOSIS — M899 Disorder of bone, unspecified: Secondary | ICD-10-CM

## 2019-01-07 DIAGNOSIS — R591 Generalized enlarged lymph nodes: Secondary | ICD-10-CM

## 2019-01-07 DIAGNOSIS — C801 Malignant (primary) neoplasm, unspecified: Secondary | ICD-10-CM

## 2019-01-07 DIAGNOSIS — M810 Age-related osteoporosis without current pathological fracture: Secondary | ICD-10-CM | POA: Insufficient documentation

## 2019-01-07 DIAGNOSIS — R937 Abnormal findings on diagnostic imaging of other parts of musculoskeletal system: Secondary | ICD-10-CM

## 2019-01-07 DIAGNOSIS — R590 Localized enlarged lymph nodes: Secondary | ICD-10-CM

## 2019-01-07 DIAGNOSIS — E039 Hypothyroidism, unspecified: Secondary | ICD-10-CM | POA: Diagnosis not present

## 2019-01-07 DIAGNOSIS — R599 Enlarged lymph nodes, unspecified: Secondary | ICD-10-CM

## 2019-01-07 LAB — CBC WITH DIFFERENTIAL/PLATELET
Abs Immature Granulocytes: 0.01 10*3/uL (ref 0.00–0.07)
BASOS ABS: 0.1 10*3/uL (ref 0.0–0.1)
Basophils Relative: 1 %
Eosinophils Absolute: 0.4 10*3/uL (ref 0.0–0.5)
Eosinophils Relative: 6 %
HCT: 41.8 % (ref 36.0–46.0)
Hemoglobin: 13.3 g/dL (ref 12.0–15.0)
IMMATURE GRANULOCYTES: 0 %
Lymphocytes Relative: 37 %
Lymphs Abs: 2.3 10*3/uL (ref 0.7–4.0)
MCH: 28.1 pg (ref 26.0–34.0)
MCHC: 31.8 g/dL (ref 30.0–36.0)
MCV: 88.4 fL (ref 80.0–100.0)
Monocytes Absolute: 0.4 10*3/uL (ref 0.1–1.0)
Monocytes Relative: 7 %
Neutro Abs: 3 10*3/uL (ref 1.7–7.7)
Neutrophils Relative %: 49 %
Platelets: 313 10*3/uL (ref 150–400)
RBC: 4.73 MIL/uL (ref 3.87–5.11)
RDW: 13.3 % (ref 11.5–15.5)
WBC: 6.2 10*3/uL (ref 4.0–10.5)
nRBC: 0 % (ref 0.0–0.2)

## 2019-01-07 LAB — CMP (CANCER CENTER ONLY)
ALT: 20 U/L (ref 0–44)
AST: 21 U/L (ref 15–41)
Albumin: 4.2 g/dL (ref 3.5–5.0)
Alkaline Phosphatase: 104 U/L (ref 38–126)
Anion gap: 12 (ref 5–15)
BUN: 8 mg/dL (ref 6–20)
CO2: 24 mmol/L (ref 22–32)
Calcium: 9.4 mg/dL (ref 8.9–10.3)
Chloride: 104 mmol/L (ref 98–111)
Creatinine: 0.93 mg/dL (ref 0.44–1.00)
GFR, Est AFR Am: >60 mL/min (ref >60)
Glucose, Bld: 86 mg/dL (ref 70–99)
Potassium: 4.1 mmol/L (ref 3.5–5.1)
Sodium: 140 mmol/L (ref 135–145)
Total Bilirubin: 0.2 mg/dL — ABNORMAL LOW (ref 0.3–1.2)
Total Protein: 8.1 g/dL (ref 6.5–8.1)

## 2019-01-07 LAB — SEDIMENTATION RATE: Sed Rate: 10 mm/hr (ref 0–22)

## 2019-01-07 LAB — LACTATE DEHYDROGENASE: LDH: 185 U/L (ref 98–192)

## 2019-01-08 ENCOUNTER — Encounter (HOSPITAL_COMMUNITY): Admission: RE | Payer: Self-pay | Source: Home / Self Care

## 2019-01-08 ENCOUNTER — Telehealth: Payer: Self-pay | Admitting: Hematology

## 2019-01-08 ENCOUNTER — Ambulatory Visit (HOSPITAL_COMMUNITY)
Admission: RE | Admit: 2019-01-08 | Payer: BLUE CROSS/BLUE SHIELD | Source: Home / Self Care | Admitting: Orthopedic Surgery

## 2019-01-08 LAB — MULTIPLE MYELOMA PANEL, SERUM
ALBUMIN SERPL ELPH-MCNC: 4.2 g/dL (ref 2.9–4.4)
Albumin/Glob SerPl: 1.3 (ref 0.7–1.7)
Alpha 1: 0.2 g/dL (ref 0.0–0.4)
Alpha2 Glob SerPl Elph-Mcnc: 0.8 g/dL (ref 0.4–1.0)
B-GLOBULIN SERPL ELPH-MCNC: 1.1 g/dL (ref 0.7–1.3)
Gamma Glob SerPl Elph-Mcnc: 1.2 g/dL (ref 0.4–1.8)
Globulin, Total: 3.3 g/dL (ref 2.2–3.9)
IgA: 242 mg/dL (ref 87–352)
IgG (Immunoglobin G), Serum: 1359 mg/dL (ref 586–1602)
IgM (Immunoglobulin M), Srm: 80 mg/dL (ref 26–217)
Total Protein ELP: 7.5 g/dL (ref 6.0–8.5)

## 2019-01-08 LAB — KAPPA/LAMBDA LIGHT CHAINS
KAPPA, LAMDA LIGHT CHAIN RATIO: 0.94 (ref 0.26–1.65)
Kappa free light chain: 14.6 mg/L (ref 3.3–19.4)
Lambda free light chains: 15.6 mg/L (ref 5.7–26.3)

## 2019-01-08 LAB — PTH, INTACT AND CALCIUM
Calcium, Total (PTH): 9.7 mg/dL (ref 8.7–10.2)
PTH: 34 pg/mL (ref 15–65)

## 2019-01-08 LAB — BETA 2 MICROGLOBULIN, SERUM: Beta-2 Microglobulin: 1.8 mg/L (ref 0.6–2.4)

## 2019-01-08 LAB — VITAMIN D 25 HYDROXY (VIT D DEFICIENCY, FRACTURES): Vit D, 25-Hydroxy: 21.7 ng/mL — ABNORMAL LOW (ref 30.0–100.0)

## 2019-01-08 SURGERY — INSERTION, SPINAL CORD STIMULATOR, LUMBAR
Anesthesia: General

## 2019-01-08 MED ORDER — ERGOCALCIFEROL 1.25 MG (50000 UT) PO CAPS: 50000.0000 [IU] | ORAL_CAPSULE | ORAL | 1 refills | Status: DC

## 2019-01-08 NOTE — Telephone Encounter (Signed)
Called and scheduled appt per 3/31 los.  Patient agreed to do a virtual visit on 04/14.  Gave patient the number to central radiology, she is going to call central radiology to schedule her PET scan.

## 2019-01-09 ENCOUNTER — Telehealth: Payer: Self-pay | Admitting: *Deleted

## 2019-01-09 NOTE — Telephone Encounter (Signed)
Attempted to contact patient regarding test results per Dr. Grier Mitts directions. Left voice mail: Please let patient know she is vit D deficient at 21. Have ordered Ergocalciferol -sent to her pharmacy. Advised to contact office for questions.

## 2019-01-11 ENCOUNTER — Encounter: Payer: Self-pay | Admitting: Hematology

## 2019-01-14 ENCOUNTER — Encounter (HOSPITAL_COMMUNITY)
Admission: RE | Admit: 2019-01-14 | Discharge: 2019-01-14 | Disposition: A | Discharge status: Home / Self Care | Payer: BLUE CROSS/BLUE SHIELD | Source: Ambulatory Visit | Attending: Hematology | Admitting: Hematology

## 2019-01-14 ENCOUNTER — Other Ambulatory Visit: Payer: Self-pay

## 2019-01-14 DIAGNOSIS — R591 Generalized enlarged lymph nodes: Secondary | ICD-10-CM

## 2019-01-14 DIAGNOSIS — C801 Malignant (primary) neoplasm, unspecified: Secondary | ICD-10-CM | POA: Diagnosis present

## 2019-01-14 DIAGNOSIS — M899 Disorder of bone, unspecified: Secondary | ICD-10-CM | POA: Diagnosis not present

## 2019-01-14 DIAGNOSIS — M898X9 Other specified disorders of bone, unspecified site: Secondary | ICD-10-CM

## 2019-01-14 LAB — GLUCOSE, CAPILLARY: Glucose-Capillary: 96 mg/dL (ref 70–99)

## 2019-01-14 MED ORDER — FLUDEOXYGLUCOSE F - 18 (FDG) INJECTION
8.4000 | Freq: Once | INTRAVENOUS | Status: AC | PRN
  Administered 2019-01-14: 13:00:00 8.4 via INTRAVENOUS

## 2019-01-20 NOTE — Progress Notes (Signed)
HEMATOLOGY/ONCOLOGY FOLLOW UP NOTE  Date of Service: 01/21/2019  Patient Care Team: Darrol Jump, PA-C as PCP - General (Family Medicine)  CHIEF COMPLAINTS/PURPOSE OF CONSULTATION:  F/u for Retroperitoneal Lymphadenopathy   HISTORY OF PRESENTING ILLNESS:   Bonnie Parker is a wonderful 48 y.o. female who has been referred to [place] by Dr. Melina Schools, her PCP, for evaluation of Lymphadenopathy as seen in lumbar MRI from 12/17/17.   She presents to the clinic today accompanied by her family member. She was having recent back pain and had a MRI of lumbar spine on 12/17/17 which showed changes in her previous decompression and fusion surgeries along with herniated discs in lumbar spine. She also had a incidental finding of retroperitoneal lymphadenopathy.  Her chronic back pain has been going on since 2003 due to a fall. She had back surgery in 2005 and had a laminectomy in 06/2017. She also notes the pain going into her legs that started before her laminectomy. She notes in the last 2 weeks her night sweats have increased. This started right before her laminectomy after use of blood patch.   She also has osteoporosis, Hypothyroidism, extremity swelling which she takes HCTZ for. She is a non-smoker, and rarely drinks. She had a partial hysterectomy in 1999 due to significant endometriosis. She had an oophorectomy in 2002. She denies any pelvic pain since. She denies recent history of infection, cough or cold or prior cancer diagnosis. She notes her mother had cervical cancer. She notes her mother has a blood disorder, cryoglobulin anemia which is now in remission. She was given antibiotics recently in case of UTI in 07/2017. She had a pelvic scan as well at that time.   On review of symptoms, pt chronic back pain, leg pain R>L, recent and increases night sweats, occasional chills, swelling in her hands and feet. She notes she lost 10 pounds but due to use of diet pill, phentermine. She has been  taking phentermine for the past few months. She notes low chest tightness due to back pain recently. She notes constipation that she contributes to pain medication use. She denies recent cough, cold, or infection.   INTERVAL HISTORY   [name] is here for a follow up regarding her retroperitoneal lymphadenopathy. I connected with [name] on 01/21/19 at  9:00 AM EDT by telephone and verified that I am speaking with the correct person using two identifiers.  I discussed the limitations, risks, security and privacy concerns of performing an evaluation and management service by telemedicine and the availability of in-person appointments. I also discussed with the patient that there may be a patient responsible charge related to this service. The patient expressed understanding and agreed to proceed.   Other persons participating in the visit and their role in the encounter: Tim, the patient's husband.  Patient's location: her home. Provider's location: my office at the [facility]  The patient's last visit with [place] was on 01/07/19. The pt reports that she is doing well overall.  The pt reports that she continues to feel very tired, and describes these low energy levels as being stable. The pt has continued to have pelvic and hip pain. She has had chronic back pain and is pursuing a spinal stimulator placement. She endorses a lower appetite over the last month, noting her "hunger comes and goes."  Of note since the patient's last visit, pt has had a PET/CT completed on 01/14/19 with results revealing Mild lymphadenopathy in  the chest, abdomen, and pelvis is hypermetabolic on today's PET imaging. Comparing back to previous CT scans of 09/12/2018 and 12/27/2017, these lymph nodes have been essentially stable for 1 year with measurements obtained for representative nodes today essentially the same when comparing to measurements obtained on the study from 12/27/2017. The  low-density splenic lesions noted on CT scan of 09/12/2018 have evolved when comparing back to the 12/27/2017 CT. While imaging features today could certainly be related to metastatic disease or lymphoma, the relative stability over 1 year would be somewhat unusual given the lack of interval therapy. Sarcoidosis or other lymphoproliferative etiology also considerations. 2. By report, an outside MRI of 12/13/2018 documented "innumerable bone lesions". There is a single subtle hypermetabolic focus in the D78 vertebral body without associated correlate abnormality on CT imaging. Otherwise, the only other bony hypermetabolism is in the region of the patient's lumbosacral fusion, probably related to the Surgery.  Lab results (01/07/19) of CBC w/diff and CMP is as follows: all values are WNL except for Total Bilirubin at 0.2. 01/07/19 MMP and SFLC revealed all values WNL, including the absence of an M spike and a normal K:L ratio. 01/07/19 Beta 2 microglobulin at 1.8, Sed Rate at 10, LDH at 185 01/07/19 PTH at 34, Calcium at 9.7, Vitamin D at 21.7.  On review of systems, pt reports feeling tired, back pain, hip pain, and denies any other symptoms.   MEDICAL HISTORY:  Past Medical History:  Diagnosis Date  . Anxiety   . Arthritis   . Asthma   . Depression   . Hypothyroidism   . Lumbar spinal stenosis     SURGICAL HISTORY: Past Surgical History:  Procedure Laterality Date  . ABDOMINAL HYSTERECTOMY  1999  . BACK SURGERY  2005   Dr. Ronnald Ramp  . INNER EAR SURGERY    . LUMBAR LAMINECTOMY/DECOMPRESSION MICRODISCECTOMY N/A 06/21/2017   Procedure: Lumbar decompression L3-4 ;  Surgeon: Melina Schools, MD;  Location: [place];  Service: Orthopedics;  Laterality: N/A;  3 hrs    SOCIAL HISTORY: Social History   Socioeconomic History  . Marital status: Married    Spouse name: Not on file  . Number of children: Not on file  . Years of education: Not on file  . Highest education level: Not on file   Occupational History  . Not on file  Social Needs  . Financial resource strain: Not on file  . Food insecurity:    Worry: Not on file    Inability: Not on file  . Transportation needs:    Medical: Not on file    Non-medical: Not on file  Tobacco Use  . Smoking status: Never Smoker  . Smokeless tobacco: Never Used  Substance and Sexual Activity  . Alcohol use: No  . Drug use: No  . Sexual activity: Not on file  Lifestyle  . Physical activity:    Days per week: Not on file    Minutes per session: Not on file  . Stress: Not on file  Relationships  . Social connections:    Talks on phone: Not on file    Gets together: Not on file    Attends religious service: Not on file    Active member of club or organization: Not on file    Attends meetings of clubs or organizations: Not on file    Relationship status: Not on file  . Intimate partner violence:    Fear of current or ex partner: Not on file  Emotionally abused: Not on file    Physically abused: Not on file    Forced sexual activity: Not on file  Other Topics Concern  . Not on file  Social History Narrative  . Not on file    FAMILY HISTORY: No family history on file.  ALLERGIES:  is allergic to adhesive [tape].  MEDICATIONS:  Current Outpatient Medications  Medication Sig Dispense Refill  . alendronate (FOSAMAX) 70 MG tablet Take 70 mg by mouth every Monday. Take with a full glass of water on an empty stomach.    . ALPRAZolam (XANAX) 0.5 MG tablet Take 0.5 mg by mouth daily as needed for anxiety.    Marland Kitchen atorvastatin (LIPITOR) 80 MG tablet Take 80 mg by mouth daily.    . Buprenorphine HCl (BELBUCA) 450 MCG FILM Place inside cheek 2 (two) times daily.     Marland Kitchen buPROPion (WELLBUTRIN XL) 300 MG 24 hr tablet Take 300 mg by mouth daily.    . ergocalciferol (VITAMIN D2) 1.25 MG (50000 UT) capsule Take 1 capsule (50,000 Units total) by mouth once a week. 12 capsule 1  . ezetimibe (ZETIA) 10 MG tablet Take 10 mg by mouth  daily.    Marland Kitchen levothyroxine (SYNTHROID, LEVOTHROID) 50 MCG tablet Take 50 mcg by mouth daily before breakfast.    . methocarbamol (ROBAXIN) 500 MG tablet Take 1 tablet (500 mg total) by mouth 3 (three) times daily as needed for muscle spasms. 21 tablet 0  . ondansetron (ZOFRAN) 4 MG tablet Take 1 tablet (4 mg total) by mouth every 8 (eight) hours as needed for nausea or vomiting. 20 tablet 0  . oxyCODONE-acetaminophen (PERCOCET) 10-325 MG tablet Take 1 tablet by mouth every 4 (four) hours as needed for pain. 30 tablet 0  . pantoprazole (PROTONIX) 40 MG tablet Take 40 mg by mouth daily.    . phentermine 37.5 MG capsule Take 37.5 mg by mouth every morning.    . sertraline (ZOLOFT) 100 MG tablet Take 100 mg by mouth daily.    Marland Kitchen triamcinolone cream (KENALOG) 0.1 % Apply 1 application topically as needed.     No current facility-administered medications for this visit.     REVIEW OF SYSTEMS:    A 10+ POINT REVIEW OF SYSTEMS WAS OBTAINED including neurology, dermatology, psychiatry, cardiac, respiratory, lymph, extremities, GI, GU, Musculoskeletal, constitutional, breasts, reproductive, HEENT.  All pertinent positives are noted in the HPI.  All others are negative.   PHYSICAL EXAMINATION: ECOG PERFORMANCE STATUS: 1 - Symptomatic but completely ambulatory  . There were no vitals filed for this visit. There were no vitals filed for this visit. .There is no height or weight on file to calculate BMI.  No Physical Exam, per Phone Visit.  LABORATORY DATA:  I have reviewed the data as listed  . CBC Latest Ref Rng & Units 01/07/2019 09/26/2018 03/26/2018  WBC 4.0 - 10.5 K/uL 6.2 6.0 6.2  Hemoglobin 12.0 - 15.0 g/dL 13.3 12.8 12.8  Hematocrit 36.0 - 46.0 % 41.8 40.4 39.0  Platelets 150 - 400 K/uL 313 310 303   . CBC    Component Value Date/Time   WBC 6.2 01/07/2019 1402   RBC 4.73 01/07/2019 1402   HGB 13.3 01/07/2019 1402   HCT 41.8 01/07/2019 1402   PLT 313 01/07/2019 1402   MCV 88.4  01/07/2019 1402   MCH 28.1 01/07/2019 1402   MCHC 31.8 01/07/2019 1402   RDW 13.3 01/07/2019 1402   LYMPHSABS 2.3 01/07/2019 1402   MONOABS 0.4  01/07/2019 1402   EOSABS 0.4 01/07/2019 1402   BASOSABS 0.1 01/07/2019 1402    . CMP Latest Ref Rng & Units 01/07/2019 01/07/2019 09/26/2018  Glucose 70 - 99 mg/dL 86 - 116(H)  BUN 6 - 20 mg/dL 8 - 10  Creatinine 0.44 - 1.00 mg/dL 0.93 - 0.87  Sodium 135 - 145 mmol/L 140 - 140  Potassium 3.5 - 5.1 mmol/L 4.1 - 3.7  Chloride 98 - 111 mmol/L 104 - 107  CO2 22 - 32 mmol/L 24 - 22  Calcium 8.7 - 10.2 mg/dL 9.7 9.4 8.9  Total Protein 6.5 - 8.1 g/dL 8.1 - 6.6  Total Bilirubin 0.3 - 1.2 mg/dL 0.2(L) - 0.4  Alkaline Phos 38 - 126 U/L 104 - 71  AST 15 - 41 U/L 21 - 26  ALT 0 - 44 U/L 20 - 26    . Lab Results  Component Value Date   LDH 185 01/07/2019     RADIOGRAPHIC STUDIES: I have personally reviewed the radiological images as listed and agreed with the findings in the report. Nm Pet Image Initial (pi) Skull Base To Thigh  Result Date: 01/14/2019 CLINICAL DATA:  Initial treatment strategy for innumerable lytic bone lesions with lymphadenopathy. Presumed metastatic disease of unknown primary. EXAM: NUCLEAR MEDICINE PET SKULL BASE TO THIGH TECHNIQUE: 8.4 mCi F-18 FDG was injected intravenously. Full-ring PET imaging was performed from the skull base to thigh after the radiotracer. CT data was obtained and used for attenuation correction and anatomic localization. Fasting blood glucose: 96 mg/dl COMPARISON:  Abdomen/pelvis CT 09/12/2018 FINDINGS: Mediastinal blood pool activity: SUV max 2.5 NECK: No hypermetabolic lymph nodes in the neck. Incidental CT findings: none CHEST: 7 mm short axis precarinal node (58/5) is hypermetabolic with SUV max = 6.8. 7 mm short axis AP window node (27/7) is hypermetabolic with SUV max = 4.1. Hypermetabolic lymphadenopathy is identified in both hilar regions. No hypermetabolic axillary lymphadenopathy. 9 mm nodule in  the medial right middle lobe (82/4) is hypermetabolic with SUV max = 4.3. Incidental CT findings: none ABDOMEN/PELVIS: Hypermetabolic lesion in the medial spleen demonstrates SUV max = 6.1. Lateral spleen demonstrates hypermetabolic lesion with SUV max = 9.1. noncontrast CT today shows only subtle suggestion of underlying lesions, but hypermetabolism on the current PET images corresponds to the hypoattenuating lesion seen on previous diagnostic CT of 09/12/2018. Other scattered areas of hypermetabolism are seen within the splenic parenchyma. 11 mm short axis portal caval node is hypermetabolic with SUV max = 7.4. Other hypermetabolic lymph nodes are seen in the hepato duodenal ligament. Hypermetabolic retroperitoneal lymph nodes are associated. Index node in the lower left para-aortic region (127/4) demonstrates SUV max = 8.7 12 mm short axis left common iliac node (140/4) demonstrates SUV max = 10.1 9 mm short axis right common iliac node demonstrates SUV max = 9.3. A cluster of right pelvic sidewall nodes (163/4) has SUV max = 13.6. Incidental CT findings: Nonobstructing stones noted in the right kidney. There is abdominal aortic atherosclerosis without aneurysm. SKELETON: Status post lumbosacral fusion with uptake in the region of the fusion hardware likely reactive. Small focus of hypermetabolism identified in the T10 vertebral body ( SUV max = 4.3) without underlying discrete bone lesion on CT images. Incidental CT findings: none IMPRESSION: 1. Mild lymphadenopathy in the chest, abdomen, and pelvis is hypermetabolic on today's PET imaging. Comparing back to previous CT scans of 09/12/2018 and 12/27/2017, these lymph nodes have been essentially stable for 1 year with measurements obtained for  representative nodes today essentially the same when comparing to measurements obtained on the study from 12/27/2017. The low-density splenic lesions noted on CT scan of 09/12/2018 have evolved when comparing back to the  12/27/2017 CT. While imaging features today could certainly be related to metastatic disease or lymphoma, the relative stability over 1 year would be somewhat unusual given the lack of interval therapy. Sarcoidosis or other lymphoproliferative etiology also considerations. 2. By report, an outside MRI of 12/13/2018 documented "innumerable bone lesions". There is a single subtle hypermetabolic focus in the P53 vertebral body without associated correlate abnormality on CT imaging. Otherwise, the only other bony hypermetabolism is in the region of the patient's lumbosacral fusion, probably related to the surgery. Electronically Signed   By: Misty Stanley M.D.   On: 01/14/2019 20:30      12/17/17   ASSESSMENT & PLAN:   HODA HON is a 48 y.o. caucasian female with   1. Retroperitoneal Lymphadenopathy and now with innumerable bone lesions noted on MRI spine-- concerning for occult metastatic malignancy.  12/27/17 CT C/A/P revealed 1. Mild porta hepatis, retroperitoneal and bilateral pelvic lymphadenopathy. Lymphoproliferative disorder not excluded. 2. No thoracic adenopathy. 3.  Aortic Atherosclerosis.  03/26/18 CT Abdomen revealed Stable scattered upper abdominal and retroperitoneal and pelvic lymph nodes. The retroperitoneal nodes appears stable since a prior CT myelogram from 2018. These are likely benign.   09/12/18 CT A/P revealed Stable mild abdominal and pelvic lymphadenopathy. Stable small indeterminate splenic lesions.  Outside record 12/13/18 MRI Thoracic spine identified innumerable bone lesions, and could not rule out bone mets or myeloma. Also noted persisting retroperitoneal lymphadenopathy, but comparison to previous imaging was not included.  2. Chronic back pain, Osteoporosis -with associated b/l leg pain R>L, lower chest and abdominal tightness -Has undergone 2 spinal surgeries  -Takes Fosamax for osteoporosis -Takes oxycodone prn  3. Hypothyroidism  -On Synthroid -Checks  TSH every 6 months with PCP   PLAN:  -Discussed pt labwork from 01/07/19; blood counts and chemistries were normal. Immunoglobulins and serum free light chains all WNL. No M spike. Beta 2, Sed Rate and LDH all normal. PTH and calcium normal. Vitamin D low at 21.7. -Discussed the 01/14/19 PET/CT which revealed Mild lymphadenopathy in the chest, abdomen, and pelvis is hypermetabolic on today's PET imaging. Comparing back to previous CT scans of 09/12/2018 and 12/27/2017, these lymph nodes have been essentially stable for 1 year with measurements obtained for representative nodes today essentially the same when comparing to measurements obtained on the study from 12/27/2017. The low-density splenic lesions noted on CT scan of 09/12/2018 have evolved when comparing back to the 12/27/2017 CT. While imaging features today could certainly be related to metastatic disease or lymphoma, the relative stability over 1 year would be somewhat unusual given the lack of interval therapy. Sarcoidosis or other lymphoproliferative etiology also considerations. 2. By report, an outside MRI of 12/13/2018 documented "innumerable bone lesions". There is a single subtle hypermetabolic focus in the I14 vertebral body without associated correlate abnormality on CT imaging. Otherwise, the only other bony hypermetabolism is in the region of the patient's lumbosacral fusion, probably related to the Surgery. -Discussed the activity levels associated with the lymph nodes seem to indicate inflammation vs reactive process vs low grade lymphoma? Reassuring stability seen over the last year. -Discussed that the PET/CT did not confirm the report from outside records of numerous bony lesions, though there was a subtle hypermetabolic focus seen at E31 without bony abnormality seen on CT. -Given  patient's medical history of DDD, osteoporosis, and scoliosis, it is difficult to evaluate if her lesions are malignant or benign -Discussed that pt's  Vitamin D deficiency can cause bone loss and scarring over time, goal for replacement >60. -Continue Vitamin D replacement for bone health optimization -Discussed that the patient does not have blood count abnormalities, and therefore a BM bx is not indicated -Discussed that the patient's hypermetabolic lymph nodes are quite small and not easily accessible -Discussed that it is difficult to suggest that the limited extent of her possible disease would cause her reported level of fatigue -Reassuring that lymph nodes have not changed in the last year, and due to the inaccessibility of the involved lymph nodes, it would be reasonable to continue watchful observation vs getting a biopsy now with IR under CT guidance. Discussed that if IR is unable to get a biopsy, pt may need a laparoscopic surgical procedure. Pt would like to limit her risk of exposure in a hospital setting, and pursue consideration of a biopsy (pelvic or common iliac?) with IR in 2-3 months, which is reasonable at this time. -Will see the pt back in 8 weeks   CT guided biopsy of pelvic or common iliac PET+ve lymph node in 2 months RTC with Dr Irene Limbo in 1 week after CT guided biopsy   I discussed the assessment and treatment plan with the patient. The patient was provided an opportunity to ask questions and al were answered. The patient agreed with the plan and demonstrated an understanding of the instructions.   The patient was advised to call back or seek an in-person evaluation if the symptoms worsen or if the condition fails to improve as anticipated.   The total time spent in the appt was 25 minutes and more than 50% was on counseling and direct patient cares.    Sullivan Lone MD MS AAHIVMS [facility] Hematology/Oncology Physician [facility]  (Office):       [phone] (Work cell):  [phone] (Fax):           [phone]  01/21/2019 10:10 AM  I, Baldwin [place], am acting as a scribe for Dr. Sullivan Lone.    .I have reviewed the above documentation for accuracy and completeness, and I agree with the above. Brunetta Genera MD

## 2019-01-21 ENCOUNTER — Telehealth: Payer: Self-pay | Admitting: Hematology

## 2019-01-21 ENCOUNTER — Inpatient Hospital Stay: Payer: BLUE CROSS/BLUE SHIELD | Attending: Hematology | Admitting: Hematology

## 2019-01-21 DIAGNOSIS — R591 Generalized enlarged lymph nodes: Secondary | ICD-10-CM

## 2019-01-21 DIAGNOSIS — M549 Dorsalgia, unspecified: Secondary | ICD-10-CM

## 2019-01-21 DIAGNOSIS — Z79899 Other long term (current) drug therapy: Secondary | ICD-10-CM

## 2019-01-21 DIAGNOSIS — M810 Age-related osteoporosis without current pathological fracture: Secondary | ICD-10-CM

## 2019-01-21 DIAGNOSIS — E039 Hypothyroidism, unspecified: Secondary | ICD-10-CM | POA: Diagnosis not present

## 2019-01-21 NOTE — Telephone Encounter (Signed)
Scheduled appt per 4/14 los.  CT biopsy is in review.

## 2019-02-28 ENCOUNTER — Ambulatory Visit: Payer: Self-pay | Admitting: Orthopedic Surgery

## 2019-03-04 ENCOUNTER — Ambulatory Visit: Payer: Self-pay | Admitting: Orthopedic Surgery

## 2019-03-04 NOTE — H&P (Signed)
Subjective:   Patient is a 48 y.o. female presented with a history of chronic low bad pain, post-laminectomy syndrome, lumbar DDD. She is being admitted for surgical management of this condition. The indications for the procedure include chronic low back pain that improved with SCS trial.  Patient Active Problem List   Diagnosis Date Noted  . Spinal stenosis 06/21/2017   Past Medical History:  Diagnosis Date  . Anxiety   . Arthritis   . Asthma   . Depression   . Hypothyroidism   . Lumbar spinal stenosis     Past Surgical History:  Procedure Laterality Date  . ABDOMINAL HYSTERECTOMY  1999  . BACK SURGERY  2005   Dr. Ronnald Ramp  . INNER EAR SURGERY    . LUMBAR LAMINECTOMY/DECOMPRESSION MICRODISCECTOMY N/A 06/21/2017   Procedure: Lumbar decompression L3-4 ;  Surgeon: Melina Schools, MD;  Location: [facility];  Service: Orthopedics;  Laterality: N/A;  3 hrs    Current Outpatient Medications  Medication Sig Dispense Refill Last Dose  . alendronate (FOSAMAX) 70 MG tablet Take 70 mg by mouth every Monday. Take with a full glass of water on an empty stomach.   Taking  . ALPRAZolam (XANAX) 0.5 MG tablet Take 0.5 mg by mouth daily as needed for anxiety.   Not Taking  . atorvastatin (LIPITOR) 80 MG tablet Take 80 mg by mouth daily.   Not Taking  . Buprenorphine HCl (BELBUCA) 450 MCG FILM Place inside cheek 2 (two) times daily.    Taking  . buPROPion (WELLBUTRIN XL) 300 MG 24 hr tablet Take 300 mg by mouth daily.   Taking  . ergocalciferol (VITAMIN D2) 1.25 MG (50000 UT) capsule Take 1 capsule (50,000 Units total) by mouth once a week. 12 capsule 1   . ezetimibe (ZETIA) 10 MG tablet Take 10 mg by mouth daily.   Taking  . levothyroxine (SYNTHROID, LEVOTHROID) 50 MCG tablet Take 50 mcg by mouth daily before breakfast.   Taking  . methocarbamol (ROBAXIN) 500 MG tablet Take 1 tablet (500 mg total) by mouth 3 (three) times daily as needed for muscle spasms. 21 tablet 0 Taking  . ondansetron (ZOFRAN) 4 MG  tablet Take 1 tablet (4 mg total) by mouth every 8 (eight) hours as needed for nausea or vomiting. 20 tablet 0 Taking  . oxyCODONE-acetaminophen (PERCOCET) 10-325 MG tablet Take 1 tablet by mouth every 4 (four) hours as needed for pain. 30 tablet 0 Taking  . pantoprazole (PROTONIX) 40 MG tablet Take 40 mg by mouth daily.   Taking  . phentermine 37.5 MG capsule Take 37.5 mg by mouth every morning.   Not Taking  . sertraline (ZOLOFT) 100 MG tablet Take 100 mg by mouth daily.   Taking  . triamcinolone cream (KENALOG) 0.1 % Apply 1 application topically as needed.   Not Taking   No current facility-administered medications for this visit.    Allergies  Allergen Reactions  . Adhesive [Tape] Other (See Comments)    blisters    Social History   Tobacco Use  . Smoking status: Never Smoker  . Smokeless tobacco: Never Used  Substance Use Topics  . Alcohol use: No    No significant Family history.  Review of Systems As stated in HPI  Objective:   Clinical exam: Bibi is a pleasant individual, who appears younger than their stated age. She Is alert and orientated 3. No shortness of breath, chest pain. Abdomen is soft and non-tender, negative loss of bowel and bladder control,  no rebound tenderness. Negative: skin lesions abrasions contusions  Peripheral pulses: 2+ dorsalis pedis/posterior tibialis pulses bilaterally. Compartment soft and nontender.  Lungs: Clear to auscultation bilaterally  Cardiac: Regular rate and rhythm no rubs gallops murmurs  Gait pattern: Normal gait pattern  Assistive devices: No assistive devices at present  Neuro: Intermittent bilateral dysesthesias in the lower extremity. 5/5 motor strength in the lower extremity. Negative nerve root tension signs. 1+ deep tendon reflexes at the knee and Achilles bilaterally. Negative Babinski test, no clonus  Musculoskeletal: Patient has long-standing low back pain with palpation and range of motion. Pain radiates into the  lower extremity. Minimal hip, knee, ankle pain with joint range of motion.  Thoracic MRI completed on 12/06/18 demonstrates no significant spinal stenosis that would prohibit implantation of spinal cord stimulator.   Assessment:   Patient returns today for her preop evaluation for a spinal cord stimulator placement. At this point time all of her questions were addressed. She did indicate that she saw her oncologist who did not feel as though the abnormal signals seen in the thoracic MRI were significant. She had a PET scan did not light up.  Plan:   I have gone over the surgical procedure again with the patient as well as the postoperative course. All of her questions were encouraged and addressed.  Risks and benefits of surgery were discussed with the patient. These include: Infection, bleeding, death, stroke, paralysis, ongoing or worse pain, need for additional surgery, leak of spinal fluid, Failure of the battery requiring reoperation. Inability to place the paddle requiring the surgery to be aborted. Migration of the lead, failure to obtain results similar to the trial.  Plan on moving forward with surgery on 03/13/19.

## 2019-03-10 NOTE — Pre-Procedure Instructions (Signed)
Aadya SUNI JARNAGIN  03/10/2019      [id number] [address], [place] AT [address] [address] Phone: [phone] Fax: [phone]    Your procedure is scheduled on June 4  Report to [facility] Entrance A at 8:35 A.M.  Call this number if you have problems the morning of surgery:  [phone]   Remember:  Do not eat or drink after midnight.      Take these medicines the morning of surgery with A SIP OF WATER :              Bupropion (wellbutrin)             Hydrocodone             Levothyroxine (synthroid)             Methocarbamol (robaxin)             Pantoprazole (protonix)             Sertraline (zoloft)            STOP BUPRENORPHINE (BELBUCA) 3 DAYS PRIOR TO SURGERY               7 days prior to surgery STOP taking any Aspirin (unless otherwise instructed by your surgeon), Aleve, Naproxen, Ibuprofen, Motrin, Advil, Goody's, BC's, all herbal medications, fish oil, and all vitamins.                 Do not wear jewelry, make-up or nail polish.  Do not wear lotions, powders, or perfumes, or deodorant.  Do not shave 48 hours prior to surgery.  Men may shave face and neck.  Do not bring valuables to the hospital.  [facility] is not responsible for any belongings or valuables.  Contacts, dentures or bridgework may not be worn into surgery.  Leave your suitcase in the car.  After surgery it may be brought to your room.  For patients admitted to the hospital, discharge time will be determined by your treatment team.  Patients discharged the day of surgery will not be allowed to drive home.    Special instructions:  - Preparing For Surgery  Before surgery, you can play an important role. Because skin is not sterile, your skin needs to be as free of germs as possible. You can reduce the number of germs on your skin by washing with CHG (chlorahexidine gluconate) Soap  before surgery.  CHG is an antiseptic cleaner which kills germs and bonds with the skin to continue killing germs even after washing.    Oral Hygiene is also important to reduce your risk of infection.  Remember - BRUSH YOUR TEETH THE MORNING OF SURGERY WITH YOUR REGULAR TOOTHPASTE  Please do not use if you have an allergy to CHG or antibacterial soaps. If your skin becomes reddened/irritated stop using the CHG.  Do not shave (including legs and underarms) for at least 48 hours prior to first CHG shower. It is OK to shave your face.  Please follow these instructions carefully.   1. Shower the NIGHT BEFORE SURGERY and the MORNING OF SURGERY with CHG.   2. If you chose to wash your hair, wash your hair first as usual with your normal shampoo.  3. After you shampoo, rinse your hair and body thoroughly to remove the shampoo.  4. Use CHG as you would any other liquid  soap. You can apply CHG directly to the skin and wash gently with a scrungie or a clean washcloth.   5. Apply the CHG Soap to your body ONLY FROM THE NECK DOWN.  Do not use on open wounds or open sores. Avoid contact with your eyes, ears, mouth and genitals (private parts). Wash Face and genitals (private parts)  with your normal soap.  6. Wash thoroughly, paying special attention to the area where your surgery will be performed.  7. Thoroughly rinse your body with warm water from the neck down.  8. DO NOT shower/wash with your normal soap after using and rinsing off the CHG Soap.  9. Pat yourself dry with a CLEAN TOWEL.  10. Wear CLEAN PAJAMAS to bed the night before surgery, wear comfortable clothes the morning of surgery  11. Place CLEAN SHEETS on your bed the night of your first shower and DO NOT SLEEP WITH PETS.    Day of Surgery:  Do not apply any deodorants/lotions.  Please wear clean clothes to the hospital/surgery center.   Remember to brush your teeth WITH YOUR REGULAR TOOTHPASTE.    Please read over the  following fact sheets that you were given. Coughing and Deep Breathing and Surgical Site Infection Prevention

## 2019-03-11 ENCOUNTER — Ambulatory Visit (HOSPITAL_COMMUNITY)
Admission: RE | Admit: 2019-03-11 | Discharge: 2019-03-11 | Disposition: A | Discharge status: Home / Self Care | Payer: BC Managed Care – PPO | Source: Ambulatory Visit | Attending: Orthopedic Surgery | Admitting: Orthopedic Surgery

## 2019-03-11 ENCOUNTER — Encounter (HOSPITAL_COMMUNITY)
Admission: RE | Admit: 2019-03-11 | Discharge: 2019-03-11 | Disposition: A | Discharge status: Home / Self Care | Payer: BC Managed Care – PPO | Source: Ambulatory Visit | Attending: Orthopedic Surgery | Admitting: Orthopedic Surgery

## 2019-03-11 ENCOUNTER — Encounter (HOSPITAL_COMMUNITY): Payer: Self-pay

## 2019-03-11 ENCOUNTER — Other Ambulatory Visit: Payer: Self-pay

## 2019-03-11 ENCOUNTER — Other Ambulatory Visit (HOSPITAL_COMMUNITY)
Admission: RE | Admit: 2019-03-11 | Discharge: 2019-03-11 | Disposition: A | Discharge status: Home / Self Care | Payer: BC Managed Care – PPO | Source: Ambulatory Visit | Attending: Orthopedic Surgery | Admitting: Orthopedic Surgery

## 2019-03-11 DIAGNOSIS — M1990 Unspecified osteoarthritis, unspecified site: Secondary | ICD-10-CM | POA: Diagnosis not present

## 2019-03-11 DIAGNOSIS — J45909 Unspecified asthma, uncomplicated: Secondary | ICD-10-CM | POA: Insufficient documentation

## 2019-03-11 DIAGNOSIS — G894 Chronic pain syndrome: Secondary | ICD-10-CM | POA: Insufficient documentation

## 2019-03-11 DIAGNOSIS — Z79899 Other long term (current) drug therapy: Secondary | ICD-10-CM | POA: Insufficient documentation

## 2019-03-11 DIAGNOSIS — F419 Anxiety disorder, unspecified: Secondary | ICD-10-CM | POA: Insufficient documentation

## 2019-03-11 DIAGNOSIS — Z01818 Encounter for other preprocedural examination: Secondary | ICD-10-CM | POA: Insufficient documentation

## 2019-03-11 DIAGNOSIS — F329 Major depressive disorder, single episode, unspecified: Secondary | ICD-10-CM | POA: Insufficient documentation

## 2019-03-11 DIAGNOSIS — E039 Hypothyroidism, unspecified: Secondary | ICD-10-CM | POA: Diagnosis not present

## 2019-03-11 DIAGNOSIS — M48061 Spinal stenosis, lumbar region without neurogenic claudication: Secondary | ICD-10-CM | POA: Insufficient documentation

## 2019-03-11 DIAGNOSIS — R591 Generalized enlarged lymph nodes: Secondary | ICD-10-CM | POA: Diagnosis not present

## 2019-03-11 DIAGNOSIS — Z1159 Encounter for screening for other viral diseases: Secondary | ICD-10-CM | POA: Diagnosis not present

## 2019-03-11 LAB — URINALYSIS, ROUTINE W REFLEX MICROSCOPIC
Bacteria, UA: NONE SEEN
Glucose, UA: NEGATIVE mg/dL
Ketones, ur: NEGATIVE mg/dL
Leukocytes,Ua: NEGATIVE
Nitrite: NEGATIVE
Protein, ur: 30 mg/dL — AB
Specific Gravity, Urine: 1.031 — ABNORMAL HIGH (ref 1.005–1.030)
pH: 5 (ref 5.0–8.0)

## 2019-03-11 LAB — BASIC METABOLIC PANEL
Anion gap: 7 (ref 5–15)
BUN: 8 mg/dL (ref 6–20)
CO2: 26 mmol/L (ref 22–32)
Calcium: 9.3 mg/dL (ref 8.9–10.3)
Chloride: 104 mmol/L (ref 98–111)
Creatinine, Ser: 0.86 mg/dL (ref 0.44–1.00)
GFR calc Af Amer: >60 mL/min (ref >60)
GFR calc non Af Amer: >60 mL/min (ref >60)
Glucose, Bld: 96 mg/dL (ref 70–99)
Potassium: 3.9 mmol/L (ref 3.5–5.1)
Sodium: 137 mmol/L (ref 135–145)

## 2019-03-11 LAB — CBC
HCT: 40.9 % (ref 36.0–46.0)
Hemoglobin: 13 g/dL (ref 12.0–15.0)
MCH: 28.4 pg (ref 26.0–34.0)
MCHC: 31.8 g/dL (ref 30.0–36.0)
MCV: 89.5 fL (ref 80.0–100.0)
Platelets: 314 10*3/uL (ref 150–400)
RBC: 4.57 MIL/uL (ref 3.87–5.11)
RDW: 13.9 % (ref 11.5–15.5)
WBC: 6.7 10*3/uL (ref 4.0–10.5)
nRBC: 0 % (ref 0.0–0.2)

## 2019-03-11 LAB — PROTIME-INR
INR: 1.1 (ref 0.8–1.2)
Prothrombin Time: 14 seconds (ref 11.4–15.2)

## 2019-03-11 LAB — SURGICAL PCR SCREEN
MRSA, PCR: NEGATIVE
Staphylococcus aureus: NEGATIVE

## 2019-03-11 LAB — APTT: aPTT: 32 seconds (ref 24–36)

## 2019-03-11 NOTE — Anesthesia Preprocedure Evaluation (Addendum)
Anesthesia Evaluation  Patient identified by MRN, date of birth, ID band Patient awake    Reviewed: Allergy & Precautions, H&P , NPO status , Patient's Chart, lab work & pertinent test results  Airway Mallampati: II   Neck ROM: full    Dental   Pulmonary asthma ,    breath sounds clear to auscultation       Cardiovascular negative cardio ROS   Rhythm:regular Rate:Normal     Neuro/Psych PSYCHIATRIC DISORDERS Anxiety Depression    GI/Hepatic   Endo/Other  Hypothyroidism   Renal/GU      Musculoskeletal  (+) Arthritis ,   Abdominal   Peds  Hematology   Anesthesia Other Findings   Reproductive/Obstetrics                             Anesthesia Physical Anesthesia Plan  ASA: II  Anesthesia Plan: General   Post-op Pain Management:    Induction: Intravenous  PONV Risk Score and Plan: 3 and Ondansetron, Dexamethasone, Midazolam and Treatment may vary due to age or medical condition  Airway Management Planned: Oral ETT  Additional Equipment:   Intra-op Plan:   Post-operative Plan: Extubation in OR  Informed Consent: I have reviewed the patients History and Physical, chart, labs and discussed the procedure including the risks, benefits and alternatives for the proposed anesthesia with the patient or authorized representative who has indicated his/her understanding and acceptance.       Plan Discussed with: CRNA, Anesthesiologist and Surgeon  Anesthesia Plan Comments: (PAT note written by Myra Gianotti, PA-C. )      Anesthesia Quick Evaluation

## 2019-03-11 NOTE — Progress Notes (Signed)
PCP - Darrol Jump, PA-C Cardiologist - denies  Chest x-ray - 03/11/19 EKG - N/A Stress Test -01/26/17  ECHO - denies Cardiac Cath - denies  Sleep Study - denies CPAP - denies  Blood Thinner Instructions:N/A Aspirin Instructions:N/A  Anesthesia review: Yes, per consult order.  Patient denies shortness of breath, fever, cough and chest pain at PAT appointment   Patient verbalized understanding of instructions that were given to them at the PAT appointment. Patient was also instructed that they will need to review over the PAT instructions again at home before surgery.  Patient is having her covid-19 testing following her PAT appointment.    Coronavirus Screening  Have you experienced the following symptoms:  Cough yes/no: No Fever (>100.87F)  yes/no: No Runny nose yes/no: No Sore throat yes/no: No Difficulty breathing/shortness of breath  yes/no: No  Have you or a family member traveled in the last 14 days and where? yes/no: No   If the patient indicates "YES" to the above questions, their PAT will be rescheduled to limit the exposure to others and, the surgeon will be notified. THE PATIENT WILL NEED TO BE ASYMPTOMATIC FOR 14 DAYS.   If the patient is not experiencing any of these symptoms, the PAT nurse will instruct them to NOT bring anyone with them to their appointment since they may have these symptoms or traveled as well.   Please remind your patients and families that hospital visitation restrictions are in effect and the importance of the restrictions.

## 2019-03-11 NOTE — Progress Notes (Addendum)
Anesthesia Chart Review:  Case:  [id number] Date/Time:  03/13/19 1020   Procedure:  LUMBAR SPINAL CORD STIMULATOR INSERTION (N/A )   Anesthesia type:  General   Pre-op diagnosis:  Chronic pain syndrome, failed back syndrome   Location:  [facility] OR ROOM 04 / [facility] OR   Surgeon:  Melina Schools, MD      DISCUSSION: Patient is a 48 year old female scheduled for the above procedure.  History includes asthma, hypothyroidism, never smoker, back surgery (2005; s/p L3-4 decompression 06/21/17). She is followed by Dr. Irene Limbo for lymphadenopathy (see below and notes/reports in [facility]).  She denied SOB, chest pain, cough, fever at PAT RN visit. 03/11/19 presurgical COVID test is in process. If no acute changes then I anticipate that she can proceed as planned.   VS: BP 140/78   Pulse 88   Temp 37.4 C   Resp 20   Ht 5\' 1"  (1.549 m)   Wt 73.3 kg   LMP 10/09/1997 (Within Months) Comment: hysterectomy 1999  SpO2 96%   BMI 30.53 kg/m    PROVIDERS: Darrol Jump, PA-C is PCP ([place], [place]) - Sullivan Lone, MD is HEM-ONC. Last visit 01/21/19. He has been following her since 12/2017 for retroperitoneal lymphadenopathy. Also question of innumerable bone lesions on outside 12/13/18 MRI (report not available). 01/14/19 PET scan showed hypermetabolic nodes, but stable over the past year. Single subtle hypermetabolic focus on E56 with other findings probably related to previous surgery. Given stability of findings, he discussed watchful observation versus attempt at biopsy. She desired biopsy, but wanted to wait 2-3 months to limit hospital exposure. CT guided biopsy of pelvic or common iliac LN planned in the future.     LABS: Labs reviewed: Acceptable for surgery. (all labs ordered are listed, but only abnormal results are displayed)  Labs Reviewed  URINALYSIS, ROUTINE W REFLEX MICROSCOPIC - Abnormal; Notable for the following components:      Result Value   Specific Gravity, Urine 1.031 (*)    Hgb urine  dipstick MODERATE (*)    Bilirubin Urine MODERATE (*)    Protein, ur 30 (*)    All other components within normal limits  SURGICAL PCR SCREEN  APTT  BASIC METABOLIC PANEL  CBC  PROTIME-INR    IMAGES: CXR 03/11/19: IMPRESSION: No active cardiopulmonary disease.  PET Scan 01/14/19: IMPRESSION: 1. Mild lymphadenopathy in the chest, abdomen, and pelvis is hypermetabolic on today's PET imaging. Comparing back to previous CT scans of 09/12/2018 and 12/27/2017, these lymph nodes have been essentially stable for 1 year with measurements obtained for representative nodes today essentially the same when comparing to measurements obtained on the study from 12/27/2017. The low-density splenic lesions noted on CT scan of 09/12/2018 have evolved when comparing back to the 12/27/2017 CT. While imaging features today could certainly be related to metastatic disease or lymphoma, the relative stability over 1 year would be somewhat unusual given the lack of interval therapy. Sarcoidosis or other lymphoproliferative etiology also considerations. 2. By report, an outside MRI of 12/13/2018 documented "innumerable bone lesions". There is a single subtle hypermetabolic focus in the D14 vertebral body without associated correlate abnormality on CT imaging. Otherwise, the only other bony hypermetabolism is in the region of the patient's lumbosacral fusion, probably related to the surgery.   EKG: N/A   CV: ETT 01/31/17 [facility], scanned under Media tab, Correspondence 06/21/17): IMPRESSION: 1. Treadmill stress test negative for evidence of inducible ischemia. 2. Fair exercise capacity.   Past  Medical History:  Diagnosis Date  . Anxiety   . Arthritis   . Asthma   . Depression   . Hypothyroidism   . Lumbar spinal stenosis     Past Surgical History:  Procedure Laterality Date  . ABDOMINAL HYSTERECTOMY  1999  . BACK SURGERY  2005   Dr. Ronnald Ramp  . INNER EAR SURGERY    . LUMBAR  LAMINECTOMY/DECOMPRESSION MICRODISCECTOMY N/A 06/21/2017   Procedure: Lumbar decompression L3-4 ;  Surgeon: Melina Schools, MD;  Location: [facility];  Service: Orthopedics;  Laterality: N/A;  3 hrs  . TONSILLECTOMY      MEDICATIONS: . alendronate (FOSAMAX) 70 MG tablet  . Buprenorphine HCl (BELBUCA) 450 MCG FILM  . buPROPion (WELLBUTRIN XL) 300 MG 24 hr tablet  . ergocalciferol (VITAMIN D2) 1.25 MG (50000 UT) capsule  . ezetimibe (ZETIA) 10 MG tablet  . HYDROcodone-acetaminophen (NORCO) 10-325 MG tablet  . levothyroxine (SYNTHROID, LEVOTHROID) 50 MCG tablet  . methocarbamol (ROBAXIN) 750 MG tablet  . pantoprazole (PROTONIX) 40 MG tablet  . sertraline (ZOLOFT) 100 MG tablet   No current facility-administered medications for this encounter.   Advised at 03/11/19 to buprenorphine prior to surgery or as instructed by prescribing provider.    Myra Gianotti, PA-C Surgical Short Stay/Anesthesiology [facility] Phone [phone] [facility] Phone [phone] 03/12/2019 9:20 AM

## 2019-03-11 NOTE — Progress Notes (Signed)
Left VM with Judeen Hammans at Dr. Rolena Infante office regarding pt's urinalysis. Left number for a call back if needed.   Jacqlyn Larsen, RN

## 2019-03-12 LAB — NOVEL CORONAVIRUS, NAA (HOSP ORDER, SEND-OUT TO REF LAB; TAT 18-24 HRS): SARS-CoV-2, NAA: NOT DETECTED

## 2019-03-13 ENCOUNTER — Ambulatory Visit (HOSPITAL_COMMUNITY)
Admission: RE | Admit: 2019-03-13 | Discharge: 2019-03-14 | Disposition: A | Discharge status: Home / Self Care | Payer: BC Managed Care – PPO | Attending: Orthopedic Surgery | Admitting: Orthopedic Surgery

## 2019-03-13 ENCOUNTER — Ambulatory Visit (HOSPITAL_COMMUNITY): Payer: BC Managed Care – PPO | Admitting: Anesthesiology

## 2019-03-13 ENCOUNTER — Ambulatory Visit (HOSPITAL_COMMUNITY): Payer: BC Managed Care – PPO | Admitting: Physician Assistant

## 2019-03-13 ENCOUNTER — Ambulatory Visit (HOSPITAL_COMMUNITY): Payer: BC Managed Care – PPO

## 2019-03-13 ENCOUNTER — Other Ambulatory Visit: Payer: Self-pay

## 2019-03-13 ENCOUNTER — Encounter (HOSPITAL_COMMUNITY)
Admission: RE | Disposition: A | Discharge status: Home / Self Care | Payer: Self-pay | Source: Home / Self Care | Attending: Orthopedic Surgery

## 2019-03-13 ENCOUNTER — Encounter (HOSPITAL_COMMUNITY): Payer: Self-pay | Admitting: *Deleted

## 2019-03-13 ENCOUNTER — Telehealth: Payer: Self-pay | Admitting: *Deleted

## 2019-03-13 DIAGNOSIS — Y838 Other surgical procedures as the cause of abnormal reaction of the patient, or of later complication, without mention of misadventure at the time of the procedure: Secondary | ICD-10-CM | POA: Diagnosis not present

## 2019-03-13 DIAGNOSIS — Z981 Arthrodesis status: Secondary | ICD-10-CM | POA: Insufficient documentation

## 2019-03-13 DIAGNOSIS — M545 Low back pain: Secondary | ICD-10-CM | POA: Diagnosis not present

## 2019-03-13 DIAGNOSIS — J45909 Unspecified asthma, uncomplicated: Secondary | ICD-10-CM | POA: Diagnosis not present

## 2019-03-13 DIAGNOSIS — M961 Postlaminectomy syndrome, not elsewhere classified: Secondary | ICD-10-CM | POA: Diagnosis not present

## 2019-03-13 DIAGNOSIS — G8929 Other chronic pain: Secondary | ICD-10-CM | POA: Diagnosis present

## 2019-03-13 DIAGNOSIS — F419 Anxiety disorder, unspecified: Secondary | ICD-10-CM | POA: Insufficient documentation

## 2019-03-13 DIAGNOSIS — E039 Hypothyroidism, unspecified: Secondary | ICD-10-CM | POA: Insufficient documentation

## 2019-03-13 DIAGNOSIS — M48061 Spinal stenosis, lumbar region without neurogenic claudication: Secondary | ICD-10-CM | POA: Diagnosis not present

## 2019-03-13 DIAGNOSIS — F329 Major depressive disorder, single episode, unspecified: Secondary | ICD-10-CM | POA: Diagnosis not present

## 2019-03-13 DIAGNOSIS — Z419 Encounter for procedure for purposes other than remedying health state, unspecified: Secondary | ICD-10-CM

## 2019-03-13 DIAGNOSIS — Z79899 Other long term (current) drug therapy: Secondary | ICD-10-CM | POA: Diagnosis not present

## 2019-03-13 DIAGNOSIS — M1990 Unspecified osteoarthritis, unspecified site: Secondary | ICD-10-CM | POA: Insufficient documentation

## 2019-03-13 DIAGNOSIS — G894 Chronic pain syndrome: Secondary | ICD-10-CM | POA: Diagnosis not present

## 2019-03-13 DIAGNOSIS — Z888 Allergy status to other drugs, medicaments and biological substances status: Secondary | ICD-10-CM | POA: Diagnosis not present

## 2019-03-13 HISTORY — PX: SPINAL CORD STIMULATOR INSERTION: SHX5378

## 2019-03-13 SURGERY — INSERTION, SPINAL CORD STIMULATOR, LUMBAR
Anesthesia: General

## 2019-03-13 MED ORDER — HEMOSTATIC AGENTS (NO CHARGE) OPTIME
TOPICAL | Status: DC | PRN
  Administered 2019-03-13: 1 via TOPICAL

## 2019-03-13 MED ORDER — METHOCARBAMOL 500 MG PO TABS: 500.0000 mg | ORAL_TABLET | Freq: Three times a day (TID) | ORAL | 0 refills | Status: AC | PRN

## 2019-03-13 MED ORDER — LIDOCAINE 2% (20 MG/ML) 5 ML SYRINGE
INTRAMUSCULAR | Status: DC | PRN
  Administered 2019-03-13: 50 mg via INTRAVENOUS

## 2019-03-13 MED ORDER — SERTRALINE HCL 100 MG PO TABS
100.0000 mg | ORAL_TABLET | Freq: Every day | ORAL | Status: DC
  Administered 2019-03-14: 100 mg via ORAL
  Filled 2019-03-13: qty 1

## 2019-03-13 MED ORDER — FENTANYL CITRATE (PF) 100 MCG/2ML IJ SOLN
25.0000 ug | INTRAMUSCULAR | Status: DC | PRN
  Administered 2019-03-13 (×2): 50 ug via INTRAVENOUS

## 2019-03-13 MED ORDER — THROMBIN 20000 UNITS EX SOLR
CUTANEOUS | Status: DC | PRN
  Administered 2019-03-13: 20000 [IU] via TOPICAL

## 2019-03-13 MED ORDER — CEFAZOLIN SODIUM-DEXTROSE 2-4 GM/100ML-% IV SOLN
2.0000 g | INTRAVENOUS | Status: AC
  Administered 2019-03-13: 2 g via INTRAVENOUS

## 2019-03-13 MED ORDER — ACETAMINOPHEN 325 MG PO TABS
650.0000 mg | ORAL_TABLET | ORAL | Status: DC | PRN
  Administered 2019-03-13 – 2019-03-14 (×2): 650 mg via ORAL
  Filled 2019-03-13 (×2): qty 2

## 2019-03-13 MED ORDER — PHENYLEPHRINE 40 MCG/ML (10ML) SYRINGE FOR IV PUSH (FOR BLOOD PRESSURE SUPPORT)
PREFILLED_SYRINGE | INTRAVENOUS | Status: AC
  Filled 2019-03-13: qty 10

## 2019-03-13 MED ORDER — DEXAMETHASONE SODIUM PHOSPHATE 10 MG/ML IJ SOLN
INTRAMUSCULAR | Status: AC
  Filled 2019-03-13: qty 2

## 2019-03-13 MED ORDER — MENTHOL 3 MG MT LOZG: 1.0000 | LOZENGE | OROMUCOSAL | Status: DC | PRN

## 2019-03-13 MED ORDER — METHOCARBAMOL 500 MG PO TABS: 500.0000 mg | ORAL_TABLET | Freq: Once | ORAL | Status: DC

## 2019-03-13 MED ORDER — 0.9 % SODIUM CHLORIDE (POUR BTL) OPTIME
TOPICAL | Status: DC | PRN
  Administered 2019-03-13: 1000 mL

## 2019-03-13 MED ORDER — LIDOCAINE 2% (20 MG/ML) 5 ML SYRINGE
INTRAMUSCULAR | Status: AC
  Filled 2019-03-13: qty 10

## 2019-03-13 MED ORDER — TRANEXAMIC ACID-NACL 1000-0.7 MG/100ML-% IV SOLN
INTRAVENOUS | Status: AC
  Filled 2019-03-13: qty 100

## 2019-03-13 MED ORDER — BUPROPION HCL ER (XL) 150 MG PO TB24
300.0000 mg | ORAL_TABLET | Freq: Every day | ORAL | Status: DC
  Administered 2019-03-14: 300 mg via ORAL
  Filled 2019-03-13: qty 2

## 2019-03-13 MED ORDER — OXYCODONE HCL 5 MG PO TABS
5.0000 mg | ORAL_TABLET | Freq: Once | ORAL | Status: AC | PRN
  Administered 2019-03-13: 5 mg via ORAL

## 2019-03-13 MED ORDER — MIDAZOLAM HCL 2 MG/2ML IJ SOLN
INTRAMUSCULAR | Status: AC
  Filled 2019-03-13: qty 2

## 2019-03-13 MED ORDER — EZETIMIBE 10 MG PO TABS
10.0000 mg | ORAL_TABLET | Freq: Every day | ORAL | Status: DC
  Administered 2019-03-14: 10 mg via ORAL
  Filled 2019-03-13: qty 1

## 2019-03-13 MED ORDER — LACTATED RINGERS IV SOLN
INTRAVENOUS | Status: DC
  Administered 2019-03-13 (×2): via INTRAVENOUS

## 2019-03-13 MED ORDER — PANTOPRAZOLE SODIUM 40 MG PO TBEC
40.0000 mg | DELAYED_RELEASE_TABLET | Freq: Every day | ORAL | Status: DC
  Administered 2019-03-14: 40 mg via ORAL
  Filled 2019-03-13: qty 1

## 2019-03-13 MED ORDER — TRANEXAMIC ACID-NACL 1000-0.7 MG/100ML-% IV SOLN
INTRAVENOUS | Status: DC | PRN
  Administered 2019-03-13: 1000 mg via INTRAVENOUS

## 2019-03-13 MED ORDER — CEFAZOLIN SODIUM-DEXTROSE 1-4 GM/50ML-% IV SOLN
1.0000 g | Freq: Three times a day (TID) | INTRAVENOUS | Status: AC
  Administered 2019-03-13 – 2019-03-14 (×2): 1 g via INTRAVENOUS
  Filled 2019-03-13 (×2): qty 50

## 2019-03-13 MED ORDER — CEFAZOLIN SODIUM-DEXTROSE 2-4 GM/100ML-% IV SOLN
INTRAVENOUS | Status: AC
  Filled 2019-03-13: qty 100

## 2019-03-13 MED ORDER — PROPOFOL 10 MG/ML IV BOLUS
INTRAVENOUS | Status: AC
  Filled 2019-03-13: qty 20

## 2019-03-13 MED ORDER — OXYCODONE HCL 5 MG PO TABS: 5.0000 mg | ORAL_TABLET | ORAL | Status: DC | PRN

## 2019-03-13 MED ORDER — FENTANYL CITRATE (PF) 100 MCG/2ML IJ SOLN
INTRAMUSCULAR | Status: AC
  Filled 2019-03-13: qty 2

## 2019-03-13 MED ORDER — BUPIVACAINE-EPINEPHRINE 0.25% -1:200000 IJ SOLN
INTRAMUSCULAR | Status: DC | PRN
  Administered 2019-03-13: 20 mL

## 2019-03-13 MED ORDER — SODIUM CHLORIDE 0.9% FLUSH: 3.0000 mL | Freq: Two times a day (BID) | INTRAVENOUS | Status: DC

## 2019-03-13 MED ORDER — METHOCARBAMOL 1000 MG/10ML IJ SOLN
500.0000 mg | Freq: Four times a day (QID) | INTRAVENOUS | Status: DC | PRN
  Filled 2019-03-13: qty 5

## 2019-03-13 MED ORDER — THROMBIN (RECOMBINANT) 20000 UNITS EX SOLR
CUTANEOUS | Status: AC
  Filled 2019-03-13: qty 20000

## 2019-03-13 MED ORDER — PHENYLEPHRINE 40 MCG/ML (10ML) SYRINGE FOR IV PUSH (FOR BLOOD PRESSURE SUPPORT)
PREFILLED_SYRINGE | INTRAVENOUS | Status: DC | PRN
  Administered 2019-03-13 (×2): 60 ug via INTRAVENOUS

## 2019-03-13 MED ORDER — METHOCARBAMOL 500 MG PO TABS
ORAL_TABLET | ORAL | Status: AC
  Administered 2019-03-13: 500 mg
  Filled 2019-03-13: qty 1

## 2019-03-13 MED ORDER — ACETAMINOPHEN 650 MG RE SUPP: 650.0000 mg | RECTAL | Status: DC | PRN

## 2019-03-13 MED ORDER — METHOCARBAMOL 500 MG PO TABS
500.0000 mg | ORAL_TABLET | Freq: Four times a day (QID) | ORAL | Status: DC | PRN
  Administered 2019-03-13: 500 mg via ORAL
  Filled 2019-03-13: qty 1

## 2019-03-13 MED ORDER — OXYCODONE HCL 5 MG PO TABS
10.0000 mg | ORAL_TABLET | ORAL | Status: DC | PRN
  Administered 2019-03-13 – 2019-03-14 (×7): 10 mg via ORAL
  Filled 2019-03-13 (×6): qty 2

## 2019-03-13 MED ORDER — ROCURONIUM BROMIDE 10 MG/ML (PF) SYRINGE
PREFILLED_SYRINGE | INTRAVENOUS | Status: AC
  Filled 2019-03-13: qty 20

## 2019-03-13 MED ORDER — FENTANYL CITRATE (PF) 250 MCG/5ML IJ SOLN
INTRAMUSCULAR | Status: DC | PRN
  Administered 2019-03-13: 100 ug via INTRAVENOUS
  Administered 2019-03-13: 50 ug via INTRAVENOUS
  Administered 2019-03-13: 100 ug via INTRAVENOUS

## 2019-03-13 MED ORDER — LEVOTHYROXINE SODIUM 50 MCG PO TABS
50.0000 ug | ORAL_TABLET | Freq: Every day | ORAL | Status: DC
  Administered 2019-03-14: 50 ug via ORAL
  Filled 2019-03-13: qty 1

## 2019-03-13 MED ORDER — SUGAMMADEX SODIUM 200 MG/2ML IV SOLN
INTRAVENOUS | Status: DC | PRN
  Administered 2019-03-13: 200 mg via INTRAVENOUS

## 2019-03-13 MED ORDER — ONDANSETRON HCL 4 MG PO TABS: 4.0000 mg | ORAL_TABLET | Freq: Four times a day (QID) | ORAL | Status: DC | PRN

## 2019-03-13 MED ORDER — DEXAMETHASONE SODIUM PHOSPHATE 10 MG/ML IJ SOLN
INTRAMUSCULAR | Status: DC | PRN
  Administered 2019-03-13: 10 mg via INTRAVENOUS

## 2019-03-13 MED ORDER — SODIUM CHLORIDE 0.9% FLUSH: 3.0000 mL | INTRAVENOUS | Status: DC | PRN

## 2019-03-13 MED ORDER — BUPIVACAINE-EPINEPHRINE (PF) 0.25% -1:200000 IJ SOLN
INTRAMUSCULAR | Status: AC
  Filled 2019-03-13: qty 30

## 2019-03-13 MED ORDER — OXYCODONE HCL 5 MG PO TABS
ORAL_TABLET | ORAL | Status: AC
  Filled 2019-03-13: qty 3

## 2019-03-13 MED ORDER — OXYCODONE-ACETAMINOPHEN 10-325 MG PO TABS: 1.0000 | ORAL_TABLET | Freq: Four times a day (QID) | ORAL | 0 refills | Status: AC | PRN

## 2019-03-13 MED ORDER — PROPOFOL 10 MG/ML IV BOLUS
INTRAVENOUS | Status: DC | PRN
  Administered 2019-03-13: 125 mg via INTRAVENOUS

## 2019-03-13 MED ORDER — PHENOL 1.4 % MT LIQD: 1.0000 | OROMUCOSAL | Status: DC | PRN

## 2019-03-13 MED ORDER — MIDAZOLAM HCL 5 MG/5ML IJ SOLN
INTRAMUSCULAR | Status: DC | PRN
  Administered 2019-03-13: 2 mg via INTRAVENOUS

## 2019-03-13 MED ORDER — ONDANSETRON HCL 4 MG/2ML IJ SOLN: 4.0000 mg | Freq: Four times a day (QID) | INTRAMUSCULAR | Status: DC | PRN

## 2019-03-13 MED ORDER — ALENDRONATE SODIUM 70 MG PO TABS: 70.0000 mg | ORAL_TABLET | ORAL | Status: DC

## 2019-03-13 MED ORDER — FENTANYL CITRATE (PF) 250 MCG/5ML IJ SOLN
INTRAMUSCULAR | Status: AC
  Filled 2019-03-13: qty 5

## 2019-03-13 MED ORDER — ROCURONIUM BROMIDE 10 MG/ML (PF) SYRINGE
PREFILLED_SYRINGE | INTRAVENOUS | Status: DC | PRN
  Administered 2019-03-13: 70 mg via INTRAVENOUS

## 2019-03-13 MED ORDER — ONDANSETRON HCL 4 MG/2ML IJ SOLN
INTRAMUSCULAR | Status: AC
  Filled 2019-03-13: qty 4

## 2019-03-13 MED ORDER — ONDANSETRON HCL 4 MG PO TABS: 4.0000 mg | ORAL_TABLET | Freq: Three times a day (TID) | ORAL | 0 refills | Status: DC | PRN

## 2019-03-13 MED ORDER — OXYCODONE HCL 5 MG/5ML PO SOLN: 5.0000 mg | Freq: Once | ORAL | Status: AC | PRN

## 2019-03-13 MED ORDER — LACTATED RINGERS IV SOLN
INTRAVENOUS | Status: DC
  Administered 2019-03-13: 15:00:00 via INTRAVENOUS

## 2019-03-13 MED ORDER — SODIUM CHLORIDE 0.9 % IV SOLN: 250.0000 mL | INTRAVENOUS | Status: DC

## 2019-03-13 MED ORDER — ONDANSETRON HCL 4 MG/2ML IJ SOLN
4.0000 mg | Freq: Four times a day (QID) | INTRAMUSCULAR | Status: AC | PRN
  Administered 2019-03-13: 4 mg via INTRAVENOUS

## 2019-03-13 SURGICAL SUPPLY — 59 items
CANISTER SUCT 3000ML PPV (MISCELLANEOUS) ×3 IMPLANT
CLOSURE STERI-STRIP 1/2X4 (GAUZE/BANDAGES/DRESSINGS) ×1
CLSR STERI-STRIP ANTIMIC 1/2X4 (GAUZE/BANDAGES/DRESSINGS) ×2 IMPLANT
CONTROLLER PROCLAIM IPG E5 PAT (Neuro Prosthesis/Implant) ×3 IMPLANT
COVER MAYO STAND STRL (DRAPES) ×3 IMPLANT
COVER PROBE W GEL 5X96 (DRAPES) IMPLANT
COVER SURGICAL LIGHT HANDLE (MISCELLANEOUS) ×3 IMPLANT
COVER WAND RF STERILE (DRAPES) IMPLANT
DRAPE C-ARM 42X72 X-RAY (DRAPES) ×3 IMPLANT
DRAPE INCISE IOBAN 85X60 (DRAPES) ×3 IMPLANT
DRAPE SURG 17X23 STRL (DRAPES) ×3 IMPLANT
DRAPE U-SHAPE 47X51 STRL (DRAPES) ×3 IMPLANT
DRSG OPSITE POSTOP 4X6 (GAUZE/BANDAGES/DRESSINGS) ×3 IMPLANT
DRSG OPSITE POSTOP 4X8 (GAUZE/BANDAGES/DRESSINGS) ×3 IMPLANT
DURAPREP 26ML APPLICATOR (WOUND CARE) ×3 IMPLANT
ELECT BLADE 4.0 EZ CLEAN MEGAD (MISCELLANEOUS)
ELECT CAUTERY BLADE 6.4 (BLADE) ×3 IMPLANT
ELECT PENCIL ROCKER SW 15FT (MISCELLANEOUS) ×3 IMPLANT
ELECT REM PT RETURN 9FT ADLT (ELECTROSURGICAL) ×3
ELECTRODE BLDE 4.0 EZ CLN MEGD (MISCELLANEOUS) IMPLANT
ELECTRODE REM PT RTRN 9FT ADLT (ELECTROSURGICAL) ×1 IMPLANT
GLOVE BIO SURGEON STRL SZ7 (GLOVE) ×3 IMPLANT
GLOVE BIOGEL PI IND STRL 7.0 (GLOVE) ×1 IMPLANT
GLOVE BIOGEL PI IND STRL 8.5 (GLOVE) ×1 IMPLANT
GLOVE BIOGEL PI INDICATOR 7.0 (GLOVE) ×2
GLOVE BIOGEL PI INDICATOR 8.5 (GLOVE) ×2
GLOVE SS N UNI LF 8.5 STRL (GLOVE) ×6 IMPLANT
GOWN STRL REUS W/ TWL LRG LVL3 (GOWN DISPOSABLE) ×2 IMPLANT
GOWN STRL REUS W/TWL 2XL LVL3 (GOWN DISPOSABLE) ×3 IMPLANT
GOWN STRL REUS W/TWL LRG LVL3 (GOWN DISPOSABLE) ×6
KIT BASIN OR (CUSTOM PROCEDURE TRAY) ×3 IMPLANT
KIT TURNOVER KIT B (KITS) ×3 IMPLANT
LAMI NARROW PRIPOLE 16CH (Orthopedic Implant) ×3 IMPLANT
NEEDLE 22X1 1/2 (OR ONLY) (NEEDLE) ×3 IMPLANT
NEEDLE SPNL 18GX3.5 QUINCKE PK (NEEDLE) ×3 IMPLANT
NS IRRIG 1000ML POUR BTL (IV SOLUTION) ×3 IMPLANT
PACK LAMINECTOMY ORTHO (CUSTOM PROCEDURE TRAY) ×3 IMPLANT
PACK UNIVERSAL I (CUSTOM PROCEDURE TRAY) ×3 IMPLANT
PAD ARMBOARD 7.5X6 YLW CONV (MISCELLANEOUS) ×6 IMPLANT
PROGRAMMER DBS W/MAGNET NMRI (MISCELLANEOUS) ×3 IMPLANT
PULSE GENERATOR PROCLAIM 5ELIT - LOG607233 (Neuro Prosthesis/Implant) IMPLANT
SPATULA SILICONE BRAIN 10MM (MISCELLANEOUS) ×2 IMPLANT
SPONGE LAP 4X18 RFD (DISPOSABLE) IMPLANT
SPONGE SURGIFOAM ABS GEL 100 (HEMOSTASIS) ×3 IMPLANT
STAPLER VISISTAT 35W (STAPLE) ×3 IMPLANT
SURGIFLO W/THROMBIN 8M KIT (HEMOSTASIS) ×3 IMPLANT
SUT BONE WAX W31G (SUTURE) ×3 IMPLANT
SUT ETHIBOND 2 OS 4 DA (SUTURE) ×3 IMPLANT
SUT MNCRL AB 3-0 PS2 18 (SUTURE) ×6 IMPLANT
SUT VIC AB 1 CT1 18XCR BRD 8 (SUTURE) ×2 IMPLANT
SUT VIC AB 1 CT1 8-18 (SUTURE) ×6
SUT VIC AB 2-0 CT1 18 (SUTURE) ×3 IMPLANT
SYR BULB IRRIGATION 50ML (SYRINGE) ×3 IMPLANT
SYR CONTROL 10ML LL (SYRINGE) ×3 IMPLANT
TOOL TUNNELING 20 (MISCELLANEOUS) ×3 IMPLANT
TOWEL OR 17X24 6PK STRL BLUE (TOWEL DISPOSABLE) ×3 IMPLANT
TOWEL OR 17X26 10 PK STRL BLUE (TOWEL DISPOSABLE) ×3 IMPLANT
WATER STERILE IRR 1000ML POUR (IV SOLUTION) IMPLANT
YANKAUER SUCT BULB TIP NO VENT (SUCTIONS) ×3 IMPLANT

## 2019-03-13 NOTE — Transfer of Care (Signed)
Immediate Anesthesia Transfer of Care Note  Patient: Bonnie Parker  Procedure(s) Performed: LUMBAR SPINAL CORD STIMULATOR INSERTION (N/A )  Patient Location: PACU  Anesthesia Type:General  Level of Consciousness: awake, alert  and oriented  Airway & Oxygen Therapy: Patient Spontanous Breathing and Patient connected to nasal cannula oxygen  Post-op Assessment: Report given to RN, Post -op Vital signs reviewed and stable and Patient moving all extremities X 4  Post vital signs: Reviewed and stable  Last Vitals:  Vitals Value Taken Time  BP 137/79 03/13/2019 12:53 PM  Temp 36.4 C 03/13/2019 12:56 PM  Pulse 99 03/13/2019 12:59 PM  Resp 13 03/13/2019 12:59 PM  SpO2 99 % 03/13/2019 12:59 PM  Vitals shown include unvalidated device data.  Last Pain:  Vitals:   03/13/19 1256  PainSc: Asleep      Patients Stated Pain Goal: 3 (01/48/40 3979)  Complications: No apparent anesthesia complications

## 2019-03-13 NOTE — Brief Op Note (Signed)
03/13/2019  1:00 PM  PATIENT:  Bonnie Parker  48 y.o. female  PRE-OPERATIVE DIAGNOSIS:  Chronic pain syndrome, failed back syndrome  POST-OPERATIVE DIAGNOSIS:  Chronic pain syndrome, failed back syndrome  PROCEDURE:  Procedure(s): LUMBAR SPINAL CORD STIMULATOR INSERTION (N/A)  SURGEON:  Surgeon(s) and Role:    Melina Schools, MD - Primary  PHYSICIAN ASSISTANT:   ASSISTANTS: none   ANESTHESIA:   general  EBL:  50 mL   BLOOD ADMINISTERED:none  DRAINS: none   LOCAL MEDICATIONS USED:  MARCAINE     SPECIMEN:  No Specimen  DISPOSITION OF SPECIMEN:  N/A  COUNTS:  YES  TOURNIQUET:  * No tourniquets in log *  DICTATION: .Dragon Dictation  PLAN OF CARE: Admit for overnight observation  PATIENT DISPOSITION:  PACU - hemodynamically stable.

## 2019-03-13 NOTE — Anesthesia Postprocedure Evaluation (Signed)
Anesthesia Post Note  Patient: Bonnie Parker  Procedure(s) Performed: LUMBAR SPINAL CORD STIMULATOR INSERTION (N/A )     Patient location during evaluation: PACU Anesthesia Type: General Level of consciousness: awake and alert Pain management: pain level controlled Vital Signs Assessment: post-procedure vital signs reviewed and stable Respiratory status: spontaneous breathing, nonlabored ventilation and respiratory function stable Cardiovascular status: blood pressure returned to baseline and stable Postop Assessment: no apparent nausea or vomiting Anesthetic complications: no    Last Vitals:  Vitals:   03/13/19 1345 03/13/19 1346  BP:  (!) 141/79  Pulse: 88 89  Resp: 13 16  Temp:    SpO2: 96%     Last Pain:  Vitals:   03/13/19 1346  PainSc: [place]

## 2019-03-13 NOTE — Evaluation (Signed)
Physical Therapy Evaluation & Discharge Patient Details Name: Bonnie Parker MRN: [id number] DOB: 1971/03/18 Today's Date: 03/13/2019   History of Present Illness  Pt is a 48 y.o. female with previous L3-4 decompression (s/p 06/2017), now s/p implantation of spinal cord stimulator 03/13/19. PMH includes lumbar stenosis, arthritis, depression, anxiety.    Clinical Impression  Patient evaluated by Physical Therapy with no further acute PT needs identified. PTA, pt mod indep with intermittent use of DME if needed for pain; lives with supportive family. Educ re: back precaution, positioning, therex and importance of mobility. Today, pt mod indep with ambulation and ADLs. All education has been completed and the patient has no further questions. Encouraged continued hallway ambulation during hospital admission. Acute PT is signing off. Thank you for this referral.    Follow Up Recommendations No PT follow up;Supervision - Intermittent    Equipment Recommendations  3in1 (PT)    Recommendations for Other Services       Precautions / Restrictions Precautions Precautions: Back Precaution Comments: Pt able to recall 3/3 precautions from previous lumbar sx Restrictions Weight Bearing Restrictions: No      Mobility  Bed Mobility Overal bed mobility: Modified Independent                Transfers Overall transfer level: Modified independent Equipment used: Rolling walker (2 wheeled);None                Ambulation/Gait Ambulation/Gait assistance: Modified independent (Device/Increase time) Gait Distance (Feet): 250 Feet Assistive device: Rolling walker (2 wheeled);IV Pole;None Gait Pattern/deviations: Step-through pattern;Decreased stride length   Gait velocity interpretation: 1.31 - 2.62 ft/sec, indicative of limited community ambulator General Gait Details: Pt requesting use of RW, mod indep ambulating in room with it. Mod indep with and without use of IV pole for hallway  ambulation; slow, guarded gait  Stairs Stairs: Yes Stairs assistance: Modified independent (Device/Increase time) Stair Management: One rail Right;Step to pattern;Forwards Number of Stairs: 2 General stair comments: Ascend/descended 2 steps mod indep with rail support; reports she will hold husband's hand or use SPC if needed at home  Wheelchair Mobility    Modified Rankin (Stroke Patients Only)       Balance Overall balance assessment: Mild deficits observed, not formally tested                                           Pertinent Vitals/Pain Pain Assessment: 0-10 Pain Score: 8  Pain Location: Back Pain Descriptors / Indicators: Guarding;Sore Pain Intervention(s): Monitored during session    Home Living Family/patient expects to be discharged to:: Private residence Living Arrangements: Spouse/significant other Available Help at Discharge: Family;Available 24 hours/day Type of Home: House Home Access: Stairs to enter Entrance Stairs-Rails: None Entrance Stairs-Number of Steps: 2 Home Layout: One level Home Equipment: Cane - single point;Walker - 4 wheels Additional Comments: Has a bidet as toilet which "makes a big difference"    Prior Function Level of Independence: Independent with assistive device(s)               Hand Dominance   Dominant Hand: Right    Extremity/Trunk Assessment   Upper Extremity Assessment Upper Extremity Assessment: Overall WFL for tasks assessed    Lower Extremity Assessment Lower Extremity Assessment: Generalized weakness       Communication   Communication: No difficulties  Cognition Arousal/Alertness: Awake/alert Behavior During Therapy: [facility]  for tasks assessed/performed Overall Cognitive Status: No family/caregiver present to determine baseline cognitive functioning                                        General Comments      Exercises     Assessment/Plan    PT Assessment  Patent does not need any further PT services  PT Problem List         PT Treatment Interventions      PT Goals (Current goals can be found in the Care Plan section)  Acute Rehab PT Goals PT Goal Formulation: All assessment and education complete, DC therapy    Frequency     Barriers to discharge        Co-evaluation               AM-PAC PT "6 Clicks" Mobility  Outcome Measure Help needed turning from your back to your side while in a flat bed without using bedrails?: None Help needed moving from lying on your back to sitting on the side of a flat bed without using bedrails?: None Help needed moving to and from a bed to a chair (including a wheelchair)?: None Help needed standing up from a chair using your arms (e.g., wheelchair or bedside chair)?: None Help needed to walk in hospital room?: None Help needed climbing 3-5 steps with a railing? : None 6 Click Score: 24    End of Session   Activity Tolerance: Patient tolerated treatment well Patient left: in chair;with call bell/phone within reach Nurse Communication: Mobility status PT Visit Diagnosis: Other abnormalities of gait and mobility (R26.89)    Time: 2993-7169 PT Time Calculation (min) (ACUTE ONLY): 24 min   Charges:   PT Evaluation $PT Eval Low Complexity: 1 Low PT Treatments $Gait Training: 8-22 mins      Mabeline Caras, PT, DPT Acute Rehabilitation Services  Pager [phone] Office [place] 03/13/2019, 5:26 PM

## 2019-03-13 NOTE — Discharge Instructions (Signed)

## 2019-03-13 NOTE — Op Note (Signed)
Operative report  Preoperative diagnosis: Failed back syndrome with chronic pain.  Postoperative diagnosis: Same  Operative procedure: Implantation of spinal cord stimulator  Implants: Abbott proclaim nonrechargeable spinal cord battery.  Tri-pole paddle.   Complications: None  Indications: Bonnie Parker is a very pleasant 48 year old woman who had a previous 2 level lumbar spinal fusion and unfortunately has ongoing significant back buttock and neuropathic right leg pain.  Attempted conservative management had failed to alleviate her symptoms and improve her quality of life.  Patient ultimately had a spinal cord stimulator trial and noted significant (greater than 75%) improvement during the trial period.  As a result we elected to move forward with the permanent implantation.  All appropriate risks benefits and alternatives were discussed with the patient and consent was obtained.  Operative procedure: Patient is brought the operating room placed upon the operating room table.  After successful induction of general anesthesia and endotracheal the patient teds SCDs were applied she was turned prone onto the Wilson frame.  All bony prominences were well-padded and the thoracolumbar spine was prepped and draped in a standard fashion.  Timeout was taken to confirm patient procedure and all other important data.   Fluoroscopy was then used to identify the T12 vertebral body as well as the T10 pedicle.  I marked out my incision site for T10 laminotomy and infiltrated with quarter percent Marcaine with epinephrine.  In the holding area I ready mapped out the location for the battery site on the left side and this was also infiltrated with quarter percent Marcaine with epi.  Midline thoracic incision was made sharp dissection was carried out down to the deep fascia.  I then injected an additional amount of Marcaine with epi into the paraspinal muscles laterally in order to aid in analgesia as well as  hemostasis.  I then stripped the paraspinal muscles to expose the spinous process and lamina of T10, T11 and a portion of  T12.  Once I had exposed the posterior bony elements I took another at x-ray confirming that I had exposed inferior aspect of T10 to the superior aspect of T12.  Using a double-action Leksell rongeurs I remove the spinous process of T10 and then used a 2 mm Kerrison Roger to form a laminotomy of T10.  I then gently dissected through the ligamentum flavum and use my 1 and 2 mm Kerrison Roger to resect the ligamentum flavum and exposed the dorsal epidural fat.  I continue to dissect the epidural fat away until I could visualize the dorsal surface of the thecal sac.  Once the dorsal surface of the thecal sac was exposed the trial implant advanced it along the dorsal surface of the thecal sac.  The implant was able to freely pass superiorly to the superior aspect of the T9 vertebral body.  According to the representative this was in an adequate position.  As a result the tri-pole implant was obtained and gently passed on the dorsal surface of the thecal sac through the T10 laminotomy site.  I confirmed in the AP and lateral plane that it was properly positioned and I also confirmed with the spinal cord stimulator rep at the appropriate area was being covered to allow for adequate pain control.  At this point time I incised the T11-T12 interspinous process ligamen and then secured the leads directly to the T 11 spinous process with a #1 Ethibond suture I then wrapped around the T11 spinous process and then reapproximated the T11-12 interspinous process ligament with  the #1 Ethibond suture.  At this point the leads were directly secured to the spinous process and were looped around the spinous process in order to decrease potential risk of migration.  Second incision was made over the gluteal region and I dissected down approximately 2-1/2 cm and then created my pocket.  Using a submuscular passer  I advanced the leads from the thoracic wound to the gluteal wound.  I then irrigated both wounds and made sure that hemostasis.  After confirming that hemostasis I unplugged the Bovie and then open the spinal cord stimulator battery.  The leads were then inserted into the battery and secured according manufacture standards.  The excess lead was coiled on the bottom surface and the battery was placed into the pocket and secured to the deep fascia with two #1 Vicryl sutures.  The rep then tested the battery and both leads were functioning without any abnormal activity.  At this point time both leads were copiously irrigated with normal saline and closed in a similar fashion.  Deep fascia was closed with interrupted #1 Vicryl suture, then a layer 2-0 Vicryl suture, and the skin was reapproximated with 3-0 Monocryl.  Steri-Strips and dry dressings were applied and the patient was ultimately extubated transfer the PACU without incident.  The end of the case all needle sponge counts were correct.  There were no adverse intraoperative events.

## 2019-03-13 NOTE — Plan of Care (Signed)

## 2019-03-13 NOTE — Telephone Encounter (Signed)
Husband called - Patient had surgery 6/4 and cannot have biopsy on 6/8. She has appt on 6/22 to review biopsy with Dr.Kale and will reschedule both appts once recovered from surgery. Both appts are cancelled. Gave number for Central Scheduling to patient's husband.

## 2019-03-13 NOTE — Anesthesia Procedure Notes (Signed)
Procedure Name: Intubation Date/Time: 03/13/2019 10:37 AM Performed by: Mariea Clonts, CRNA Pre-anesthesia Checklist: Patient identified, Emergency Drugs available, Suction available and Patient being monitored Patient Re-evaluated:Patient Re-evaluated prior to induction Oxygen Delivery Method: Circle System Utilized Preoxygenation: Pre-oxygenation with 100% oxygen Induction Type: IV induction Ventilation: Mask ventilation without difficulty Laryngoscope Size: Miller and 2 Grade View: Grade II Tube type: Oral Tube size: 7.0 mm Number of attempts: 1 Airway Equipment and Method: Stylet and Oral airway Placement Confirmation: ETT inserted through vocal cords under direct vision,  positive ETCO2 and breath sounds checked- equal and bilateral Tube secured with: Tape Dental Injury: Teeth and Oropharynx as per pre-operative assessment

## 2019-03-14 DIAGNOSIS — M961 Postlaminectomy syndrome, not elsewhere classified: Secondary | ICD-10-CM | POA: Diagnosis not present

## 2019-03-14 MED FILL — Thrombin (Recombinant) For Soln 20000 Unit: CUTANEOUS | Qty: 1 | Status: AC

## 2019-03-14 NOTE — Progress Notes (Signed)
Provided discharge education/instructions, all questions and concerns addressed, seen by OT today, cleared by PT, Pt not in distress, to discharge home with belongings.

## 2019-03-14 NOTE — TOC Transition Note (Addendum)
Transition of Care [facility]) - CM/SW Discharge Note   Patient Details  Name: Bonnie Parker MRN: [id number] Date of Birth: [birth year]  [facility] CM/SW Contact:  Ninfa Meeker, RN Phone Number: 03/14/2019, 11:09 AM   Clinical Narrative:  48 yr old gentleman s/p implantation of Lumbar spinal Cord stimulator. Patient has no Home Health needs, 3in1 has been ordered.      Final next level of care: Home/Self Care Barriers to Discharge: No Barriers Identified   Patient Goals and CMS Choice        Discharge Placement                       Discharge Plan and Services   Discharge Planning Services: CM Consult Post Acute Care Choice: Durable Medical Equipment          DME Arranged: 3-N-1 DME Agency: [employer] Date DME Agency Contacted: 03/14/19 Time DME Agency Contacted: 22 Representative spoke with at DME Agency: \ HH Arranged: NA [place] Agency: NA        Social Determinants of Health ([place]) Interventions     Readmission Risk Interventions No flowsheet data found.

## 2019-03-14 NOTE — Progress Notes (Signed)
Occupational Therapy Evaluation and Discharge Patient Details Name: Bonnie Parker MRN: [id number] DOB: 1970/11/15 Today's Date: 03/14/2019    History of Present Illness Pt is a 48 y.o. female with previous L3-4 decompression (s/p 06/2017), now s/p implantation of spinal cord stimulator 03/13/19. PMH includes lumbar stenosis, arthritis, depression, anxiety.   Clinical Impression   Pt s/p procedure listed above. PTA, pt is mod I with ADLs. Pt continues to be mod I level with ADLs and functional mobility.  Therapist reviewed back precautions, provided education on ADL techniques/strategies for adherence to back precautions. Pt would benefit from a 3n1 bedside commode to assist with toilet transfers and for use as shower seat.  No further acute OT needs at this time. Will sign off. Thank you for this referral.    Follow Up Recommendations  No OT follow up;Supervision - Intermittent    Equipment Recommendations  3 in 1 bedside commode    Recommendations for Other Services       Precautions / Restrictions Precautions Precautions: Back Precaution Comments: Pt able to recall 3/3 precautions from previous lumbar sx      Mobility Bed Mobility Overal bed mobility: Modified Independent                Transfers Overall transfer level: Modified independent Equipment used: Rolling walker (2 wheeled);None                  Balance                                           ADL either performed or assessed with clinical judgement   ADL Overall ADL's : Modified independent                                       General ADL Comments: Able to complete figure four patten for LB dressing. Therapist recommended use of shower seat to assist with adhering to back precautions.      Vision         Perception     Praxis      Pertinent Vitals/Pain Pain Assessment: 0-10 Pain Score: 9  Pain Location: Back Pain Descriptors / Indicators:  Guarding;Sore Pain Intervention(s): Limited activity within patient's tolerance;Monitored during session     Hand Dominance Right   Extremity/Trunk Assessment Upper Extremity Assessment Upper Extremity Assessment: Overall WFL for tasks assessed   Lower Extremity Assessment Lower Extremity Assessment: Defer to PT evaluation       Communication Communication Communication: No difficulties   Cognition Arousal/Alertness: Awake/alert Behavior During Therapy: WFL for tasks assessed/performed Overall Cognitive Status: Within Functional Limits for tasks assessed                                     General Comments       Exercises     Shoulder Instructions      Home Living Family/patient expects to be discharged to:: Private residence Living Arrangements: Spouse/significant other Available Help at Discharge: Family;Available 24 hours/day Type of Home: House Home Access: Stairs to enter [employer] of Steps: 2 Entrance Stairs-Rails: None Home Layout: One level     Bathroom Shower/Tub: Teacher, early years/pre: Standard Bathroom Accessibility: Yes  Home Equipment: Kasandra Knudsen - single point;Walker - 4 wheels   Additional Comments: Has a bidet as toilet which "makes a big difference"      Prior Functioning/Environment Level of Independence: Independent with assistive device(s)                 OT Problem List: Decreased activity tolerance;Pain      OT Treatment/Interventions:      OT Goals(Current goals can be found in the care plan section)    OT Frequency:     Barriers to D/C:            Co-evaluation              AM-PAC OT "6 Clicks" Daily Activity     Outcome Measure Help from another person eating meals?: None Help from another person taking care of personal grooming?: None Help from another person toileting, which includes using toliet, bedpan, or urinal?: None Help from another person bathing (including  washing, rinsing, drying)?: None Help from another person to put on and taking off regular upper body clothing?: None Help from another person to put on and taking off regular lower body clothing?: None 6 Click Score: 24   End of Session Equipment Utilized During Treatment: Rolling walker Nurse Communication: Mobility status  Activity Tolerance: Patient tolerated treatment well Patient left: in bed;with call bell/phone within reach  OT Visit Diagnosis: Pain;Unsteadiness on feet (R26.81) Pain - part of body: (back)                Time: 6712-4580 OT Time Calculation (min): 15 min Charges:  OT General Charges $OT Visit: 1 Visit OT Evaluation $OT Eval Low Complexity: [place], Turah OTR/L [place] [phone] 03/14/2019, 9:52 AM

## 2019-03-14 NOTE — Progress Notes (Signed)
Subjective: 1 Day Post-Op Procedure(s) (LRB): LUMBAR SPINAL CORD STIMULATOR INSERTION (N/A) Patient reports pain as 9 on 0-10 scale.  Just dosed pain meds Tolerating PO w/o nausea/vom Ambulating w/ PT/OT +void, -BM, - Flatus, denies belly pain.  Denies CP, SOB, dizziness, HA, calf pain.   Objective: Vital signs in last 24 hours: Temp:  [97.6 F (36.4 C)-98.7 F (37.1 C)] 98.5 F (36.9 C) (06/05 0206) Pulse Rate:  [82-104] 101 (06/05 0206) Resp:  [10-20] 16 (06/05 0206) BP: (121-159)/(61-81) 121/61 (06/05 0206) SpO2:  [93 %-100 %] 96 % (06/05 0206) Weight:  [73.3 kg] 73.3 kg (06/04 0839)  Intake/Output from previous day: 06/04 0701 - 06/05 0700 In: 1551 [P.O.:120; I.V.:1431] Out: 50 [Blood:50] Intake/Output this shift: No intake/output data recorded.  Recent Labs    03/11/19 1455  HGB 13.0   Recent Labs    03/11/19 1455  WBC 6.7  RBC 4.57  HCT 40.9  PLT 314   Recent Labs    03/11/19 1455  NA 137  K 3.9  CL 104  CO2 26  BUN 8  CREATININE 0.86  GLUCOSE 96  CALCIUM 9.3   Recent Labs    03/11/19 1455  INR 1.1   AAOX3 Distal sensation in tact.  LE motor intact bilaterally, somewhat limited due to pain. Abdomen soft, non-tender.  Incision: Scant bloody drainage on thoracic incision, buttock incision C/D/I. Neither show signs of infection. Calves are soft and non-tender, no swelling, discoloration, palpbale cords, Homan's negative bilaterally.   Assessment/Plan: 1 Day Post-Op Procedure(s) (LRB): LUMBAR SPINAL CORD STIMULATOR INSERTION (N/A) Encourage IS Encourage Ambulation, up, OOB with PT/OT Increase diet as tolerated.  D/C home today after PT  Yvonne Kendall Ward 03/14/2019, 8:15 AM

## 2019-03-14 NOTE — Plan of Care (Signed)
  Problem: Education: Goal: Knowledge of General Education information will improve Description: Including pain rating scale, medication(s)/side effects and non-pharmacologic comfort measures Outcome: Progressing   Problem: Clinical Measurements: Goal: Ability to maintain clinical measurements within normal limits will improve Outcome: Progressing   Problem: Activity: Goal: Risk for activity intolerance will decrease Outcome: Progressing   Problem: Nutrition: Goal: Adequate nutrition will be maintained Outcome: Progressing   Problem: Elimination: Goal: Will not experience complications related to bowel motility Outcome: Progressing   Problem: Pain Managment: Goal: General experience of comfort will improve Outcome: Progressing   Problem: Safety: Goal: Ability to remain free from injury will improve Outcome: Progressing   Problem: Skin Integrity: Goal: Risk for impaired skin integrity will decrease Outcome: Progressing   

## 2019-03-17 ENCOUNTER — Encounter (HOSPITAL_COMMUNITY): Payer: Self-pay

## 2019-03-17 ENCOUNTER — Ambulatory Visit (HOSPITAL_COMMUNITY): Admission: RE | Admit: 2019-03-17 | Payer: BC Managed Care – PPO | Source: Ambulatory Visit

## 2019-03-18 ENCOUNTER — Encounter (HOSPITAL_COMMUNITY): Payer: Self-pay | Admitting: Orthopedic Surgery

## 2019-03-18 NOTE — H&P (Signed)
Patient ID: Bonnie Parker MRN: [id number] DOB/AGE: Sep 10, 1971 48 y.o.  Admit date: 03/13/2019 Discharge date: 03/18/2019  Admission Diagnoses:  Active Problems:   Chronic pain   Discharge Diagnoses:  Active Problems:   Chronic pain  status post Procedure(s): LUMBAR SPINAL CORD STIMULATOR INSERTION  Past Medical History:  Diagnosis Date  . Anxiety   . Arthritis   . Asthma   . Depression   . Hypothyroidism   . Lumbar spinal stenosis     Surgeries: Procedure(s): LUMBAR SPINAL CORD STIMULATOR INSERTION on 03/13/2019   Consultants: None  Discharged Condition: Improved  Hospital Course: Bonnie Parker is an 48 y.o. female who was admitted 03/13/2019 for operative treatment of chronic pain, failed back syndrome. Patient failed conservative treatments (please see the history and physical for the specifics) and had severe unremitting pain that affects sleep, daily activities and work/hobbies. After pre-op clearance, the patient was taken to the operating room on 03/13/2019 and underwent  Procedure(s): Laird.    Patient was given perioperative antibiotics:  Anti-infectives (From admission, onward)   Start     Dose/Rate Route Frequency Ordered Stop   03/13/19 1845  ceFAZolin (ANCEF) IVPB 1 g/50 mL premix     1 g 100 mL/hr over 30 Minutes Intravenous Every 8 hours 03/13/19 1434 03/14/19 0245   03/13/19 1015  ceFAZolin (ANCEF) IVPB 2g/100 mL premix     2 g 200 mL/hr over 30 Minutes Intravenous 30 min pre-op 03/13/19 0829 03/13/19 1114   03/13/19 0831  ceFAZolin (ANCEF) 2-4 GM/100ML-% IVPB    Note to Pharmacy:  Jasmine Pang   : cabinet override      03/13/19 0831 03/13/19 1044       Patient was given sequential compression devices and early ambulation to prevent DVT.   Patient benefited maximally from hospital stay and there were no complications. At the time of discharge, the patient was urinating/moving their bowels without difficulty,  tolerating a regular diet, pain is controlled with oral pain medications and they have been cleared by PT/OT.   Recent vital signs: No data found.   Recent laboratory studies: No results for input(s): WBC, HGB, HCT, PLT, NA, K, CL, CO2, BUN, CREATININE, GLUCOSE, INR, CALCIUM in the last 72 hours.  Invalid input(s): PT, 2   Discharge Medications:   Allergies as of 03/14/2019      Reactions   Adhesive [tape] Other (See Comments)   blisters      Medication List    STOP taking these medications   Belbuca 450 MCG Film Generic drug:  Buprenorphine HCl   ergocalciferol 1.25 MG (50000 UT) capsule Commonly known as:  VITAMIN D2   HYDROcodone-acetaminophen 10-325 MG tablet Commonly known as:  NORCO     TAKE these medications   alendronate 70 MG tablet Commonly known as:  FOSAMAX Take 70 mg by mouth every Monday. Take with a full glass of water on an empty stomach.   buPROPion 300 MG 24 hr tablet Commonly known as:  WELLBUTRIN XL Take 300 mg by mouth daily.   ezetimibe 10 MG tablet Commonly known as:  ZETIA Take 10 mg by mouth daily.   levothyroxine 50 MCG tablet Commonly known as:  SYNTHROID Take 50 mcg by mouth daily before breakfast.   methocarbamol 500 MG tablet Commonly known as:  Robaxin Take 1 tablet (500 mg total) by mouth every 8 (eight) hours as needed for up to 5 days for muscle spasms. What changed:  medication strength  how much to take  when to take this  reasons to take this   ondansetron 4 MG tablet Commonly known as:  Zofran Take 1 tablet (4 mg total) by mouth every 8 (eight) hours as needed for nausea or vomiting.   oxyCODONE-acetaminophen 10-325 MG tablet Commonly known as:  Percocet Take 1 tablet by mouth every 6 (six) hours as needed for up to 5 days for pain.   pantoprazole 40 MG tablet Commonly known as:  PROTONIX Take 40 mg by mouth daily.   sertraline 100 MG tablet Commonly known as:  ZOLOFT Take 100 mg by mouth daily.        Diagnostic Studies: Dg Chest 2 View  Result Date: 03/11/2019 CLINICAL DATA:  Pre-op respiratory exam for spinal stimulator placement. EXAM: CHEST - 2 VIEW COMPARISON:  05/23/2017 FINDINGS: The heart size and mediastinal contours are within normal limits. Both lungs are clear. The visualized skeletal structures are unremarkable. IMPRESSION: No active cardiopulmonary disease. Electronically Signed   By: Earle Gell M.D.   On: 03/11/2019 20:31   Dg Thoracic Spine 2 View  Result Date: 03/13/2019 CLINICAL DATA:  Spinal cord stimulator placement EXAM: THORACIC SPINE 2 VIEWS; DG C-ARM 61-120 MIN COMPARISON:  None. FLUOROSCOPY TIME:  Fluoroscopy Time:  24 seconds Radiation Exposure Index (if provided by the fluoroscopic device): Not available Number of Acquired Spot Images: 2 FINDINGS: Spinal cord stimulator is noted over the mid to lower thoracic spine. IMPRESSION: Spinal cord stimulator placement. Electronically Signed   By: Inez Catalina M.D.   On: 03/13/2019 13:04   Dg C-arm 1-60 Min  Result Date: 03/13/2019 CLINICAL DATA:  Spinal cord stimulator placement EXAM: THORACIC SPINE 2 VIEWS; DG C-ARM 61-120 MIN COMPARISON:  None. FLUOROSCOPY TIME:  Fluoroscopy Time:  24 seconds Radiation Exposure Index (if provided by the fluoroscopic device): Not available Number of Acquired Spot Images: 2 FINDINGS: Spinal cord stimulator is noted over the mid to lower thoracic spine. IMPRESSION: Spinal cord stimulator placement. Electronically Signed   By: Inez Catalina M.D.   On: 03/13/2019 13:04    Discharge Instructions    Incentive spirometry RT   Complete by:  As directed       Follow-up [place], Dahari, MD In 2 weeks.   Specialty:  Orthopedic Surgery Why:  If symptoms worsen, For suture removal, For wound re-check Contact information: [address] [place]  [address] [phone]           Discharge Plan:  discharge to home  Disposition: stable    Signed: Yvonne Kendall  [facility] PA-C [facility] [phone] 03/18/2019, 9:02 AM

## 2019-03-19 ENCOUNTER — Encounter (HOSPITAL_COMMUNITY): Payer: Self-pay | Admitting: Orthopedic Surgery

## 2019-03-27 ENCOUNTER — Other Ambulatory Visit: Payer: BLUE CROSS/BLUE SHIELD

## 2019-03-27 ENCOUNTER — Ambulatory Visit: Payer: BLUE CROSS/BLUE SHIELD | Admitting: Hematology

## 2019-03-31 ENCOUNTER — Encounter (HOSPITAL_COMMUNITY): Payer: Self-pay | Admitting: Orthopedic Surgery

## 2019-03-31 ENCOUNTER — Ambulatory Visit: Payer: BLUE CROSS/BLUE SHIELD | Admitting: Hematology

## 2019-04-16 ENCOUNTER — Encounter: Payer: Self-pay | Admitting: Hematology

## 2019-04-22 ENCOUNTER — Other Ambulatory Visit: Payer: Self-pay | Admitting: Student

## 2019-04-23 ENCOUNTER — Encounter (HOSPITAL_COMMUNITY): Payer: Self-pay

## 2019-04-23 ENCOUNTER — Other Ambulatory Visit: Payer: Self-pay

## 2019-04-23 ENCOUNTER — Ambulatory Visit (HOSPITAL_COMMUNITY)
Admission: RE | Admit: 2019-04-23 | Discharge: 2019-04-23 | Disposition: A | Discharge status: Home / Self Care | Payer: BC Managed Care – PPO | Source: Ambulatory Visit | Attending: Hematology | Admitting: Hematology

## 2019-04-23 DIAGNOSIS — F329 Major depressive disorder, single episode, unspecified: Secondary | ICD-10-CM | POA: Insufficient documentation

## 2019-04-23 DIAGNOSIS — Z79899 Other long term (current) drug therapy: Secondary | ICD-10-CM | POA: Insufficient documentation

## 2019-04-23 DIAGNOSIS — M549 Dorsalgia, unspecified: Secondary | ICD-10-CM | POA: Insufficient documentation

## 2019-04-23 DIAGNOSIS — F419 Anxiety disorder, unspecified: Secondary | ICD-10-CM | POA: Insufficient documentation

## 2019-04-23 DIAGNOSIS — E039 Hypothyroidism, unspecified: Secondary | ICD-10-CM | POA: Insufficient documentation

## 2019-04-23 DIAGNOSIS — R591 Generalized enlarged lymph nodes: Secondary | ICD-10-CM

## 2019-04-23 DIAGNOSIS — M1990 Unspecified osteoarthritis, unspecified site: Secondary | ICD-10-CM | POA: Diagnosis not present

## 2019-04-23 DIAGNOSIS — J45909 Unspecified asthma, uncomplicated: Secondary | ICD-10-CM | POA: Insufficient documentation

## 2019-04-23 LAB — CBC
HCT: 39.4 % (ref 36.0–46.0)
Hemoglobin: 12.8 g/dL (ref 12.0–15.0)
MCH: 28.8 pg (ref 26.0–34.0)
MCHC: 32.5 g/dL (ref 30.0–36.0)
MCV: 88.7 fL (ref 80.0–100.0)
Platelets: 355 10*3/uL (ref 150–400)
RBC: 4.44 MIL/uL (ref 3.87–5.11)
RDW: 13.6 % (ref 11.5–15.5)
WBC: 7.5 10*3/uL (ref 4.0–10.5)
nRBC: 0 % (ref 0.0–0.2)

## 2019-04-23 LAB — PROTIME-INR
INR: 1 (ref 0.8–1.2)
Prothrombin Time: 12.9 seconds (ref 11.4–15.2)

## 2019-04-23 MED ORDER — MIDAZOLAM HCL 2 MG/2ML IJ SOLN
INTRAMUSCULAR | Status: AC
  Filled 2019-04-23: qty 4

## 2019-04-23 MED ORDER — MIDAZOLAM HCL 2 MG/2ML IJ SOLN
INTRAMUSCULAR | Status: AC | PRN
  Administered 2019-04-23: 2 mg via INTRAVENOUS

## 2019-04-23 MED ORDER — SODIUM CHLORIDE 0.9 % IV SOLN: INTRAVENOUS | Status: DC

## 2019-04-23 MED ORDER — HYDROCODONE-ACETAMINOPHEN 5-325 MG PO TABS
1.0000 | ORAL_TABLET | ORAL | Status: DC | PRN
  Filled 2019-04-23: qty 2

## 2019-04-23 MED ORDER — FENTANYL CITRATE (PF) 100 MCG/2ML IJ SOLN
INTRAMUSCULAR | Status: AC
  Filled 2019-04-23: qty 4

## 2019-04-23 MED ORDER — FENTANYL CITRATE (PF) 100 MCG/2ML IJ SOLN
INTRAMUSCULAR | Status: AC | PRN
  Administered 2019-04-23: 50 ug via INTRAVENOUS
  Administered 2019-04-23: 25 ug via INTRAVENOUS

## 2019-04-23 NOTE — Discharge Instructions (Signed)
Needle Biopsy, Care After °This sheet gives you information about how to care for yourself after your procedure. Your health care provider may also give you more specific instructions. If you have problems or questions, contact your health care provider. °What can I expect after the procedure? °After the procedure, it is common to have soreness, bruising, or mild pain at the puncture site. This should go away in a few days. °Follow these instructions at home: °Needle insertion site care ° °· Wash your hands with soap and water before you change your bandage (dressing). If you cannot use soap and water, use hand sanitizer. °· Follow instructions from your health care provider about how to take care of your puncture site. This includes: °? When and how to change your dressing. °? When to remove your dressing. °· Check your puncture site every day for signs of infection. Check for: °? Redness, swelling, or pain. °? Fluid or blood. °? Pus or a bad smell. °? Warmth. °General instructions °· Return to your normal activities as told by your health care provider. Ask your health care provider what activities are safe for you. °· Do not take baths, swim, or use a hot tub until your health care provider approves. Ask your health care provider if you may take showers. You may only be allowed to take sponge baths. °· Take over-the-counter and prescription medicines only as told by your health care provider. °· Keep all follow-up visits as told by your health care provider. This is important. °Contact a health care provider if: °· You have a fever. °· You have redness, swelling, or pain at the puncture site that lasts longer than a few days. °· You have fluid, blood, or pus coming from your puncture site. °· Your puncture site feels warm to the touch. °Get help right away if: °· You have severe bleeding from the puncture site. °Summary °· After the procedure, it is common to have soreness, bruising, or mild pain at the puncture  site. This should go away in a few days. °· Check your puncture site every day for signs of infection, such as redness, swelling, or pain. °· Get help right away if you have severe bleeding from your puncture site. °This information is not intended to replace advice given to you by your health care provider. Make sure you discuss any questions you have with your health care provider. °Document Released: 02/09/2015 Document Revised: 12/07/2017 Document Reviewed: 10/08/2017 °Elsevier Patient Education © 2020 [employer] °Moderate Conscious Sedation, Adult, Care After °These instructions provide you with information about caring for yourself after your procedure. Your health care provider may also give you more specific instructions. Your treatment has been planned according to current medical practices, but problems sometimes occur. Call your health care provider if you have any problems or questions after your procedure. °What can I expect after the procedure? °After your procedure, it is common: °· To feel sleepy for several hours. °· To feel clumsy and have poor balance for several hours. °· To have poor judgment for several hours. °· To vomit if you eat too soon. °Follow these instructions at home: °For at least 24 hours after the procedure: ° °· Do not: °? Participate in activities where you could fall or become injured. °? Drive. °? Use heavy machinery. °? Drink alcohol. °? Take sleeping pills or medicines that cause drowsiness. °? Make important decisions or sign legal documents. °? Take care of children on your own. °· Rest. °Eating and   drinking °· Follow the diet recommended by your health care provider. °· If you vomit: °? Drink water, juice, or soup when you can drink without vomiting. °? Make sure you have little or no nausea before eating solid foods. °General instructions °· Have a responsible adult stay with you until you are awake and alert. °· Take over-the-counter and prescription medicines only as  told by your health care provider. °· If you smoke, do not smoke without supervision. °· Keep all follow-up visits as told by your health care provider. This is important. °Contact a health care provider if: °· You keep feeling nauseous or you keep vomiting. °· You feel light-headed. °· You develop a rash. °· You have a fever. °Get help right away if: °· You have trouble breathing. °This information is not intended to replace advice given to you by your health care provider. Make sure you discuss any questions you have with your health care provider. °Document Released: 07/16/2013 Document Revised: 09/07/2017 Document Reviewed: 01/15/2016 °Elsevier Patient Education © 2020 [employer]vier Inc. ° °

## 2019-04-23 NOTE — Procedures (Signed)
Interventional Radiology Procedure Note  Procedure: CT biopsy left para aortic LN  Complications: None  Estimated Blood Loss: None  Recommendations: - Bedrest x 1 hr - DC home  Signed,  Criselda Peaches, MD

## 2019-04-23 NOTE — H&P (Signed)
Chief Complaint: Patient was seen in consultation today for retroperitoneal lymph node biopsy at the request of Brunetta Genera  Referring Physician(s): Brunetta Genera  Supervising Physician: Jacqulynn Cadet  Patient Status: [facility] - Out-pt  History of Present Illness: Bonnie Parker is a 48 y.o. female   Pt with chronic back pain for yrs Recent pain stimulator placed 6/4- Dr Rolena Infante  Known RP LAN since 12/2017 Incidentally found evaluating back pain Followed by Dr Irene Limbo  01/14/19 PET:  IMPRESSION: 1. Mild lymphadenopathy in the chest, abdomen, and pelvis is hypermetabolic on today's PET imaging. Comparing back to previous CT scans of 09/12/2018 and 12/27/2017, these lymph nodes have been essentially stable for 1 year with measurements obtained for representative nodes today essentially the same when comparing to measurements obtained on the study from 12/27/2017. The low-density splenic lesions noted on CT scan of 09/12/2018 have evolved when comparing back to the 12/27/2017 CT. While imaging features today could certainly be related to metastatic disease or lymphoma, the relative stability over 1 year would be somewhat unusual given the lack of interval therapy. Sarcoidosis or other lymphoproliferative etiology also considerations. 2. By report, an outside MRI of 12/13/2018 documented "innumerable bone lesions". There is a single subtle hypermetabolic focus in the Q68 vertebral body without associated correlate abnormality on CT imaging. Otherwise, the only other bony hypermetabolism is in the region of the patient's lumbosacral fusion, probably related to the Surgery.  Dr Irene Limbo note 01/21/19: -Reassuring that lymph nodes have not changed in the last year, and due to the inaccessibility of the involved lymph nodes, it would be reasonable to continue watchful observation vs getting a biopsy now with IR under CT guidance. Discussed that if IR is unable to get a  biopsy, pt may need a laparoscopic surgical procedure. Pt would like to limit her risk of exposure in a hospital setting, and pursue consideration of a biopsy (pelvic or common iliac?) with IR in 2-3 months, which is reasonable at this time. -Will see the pt back in 8 weeks  Scheduled today for Retroperitoneal LN Bx  Past Medical History:  Diagnosis Date  . Anxiety   . Arthritis   . Asthma   . Depression   . Hypothyroidism   . Lumbar spinal stenosis     Past Surgical History:  Procedure Laterality Date  . ABDOMINAL HYSTERECTOMY  1999  . BACK SURGERY  2005   Dr. Ronnald Ramp  . INNER EAR SURGERY    . LUMBAR LAMINECTOMY/DECOMPRESSION MICRODISCECTOMY N/A 06/21/2017   Procedure: Lumbar decompression L3-4 ;  Surgeon: Melina Schools, MD;  Location: [facility];  Service: Orthopedics;  Laterality: N/A;  3 hrs  . SPINAL CORD STIMULATOR INSERTION N/A 03/13/2019   Procedure: LUMBAR SPINAL CORD STIMULATOR INSERTION;  Surgeon: Melina Schools, MD;  Location: [place];  Service: Orthopedics;  Laterality: N/A;  . TONSILLECTOMY      Allergies: Adhesive [tape]  Medications: Prior to Admission medications   Medication Sig Start Date End Date Taking? Authorizing Provider  acetaminophen (TYLENOL) 500 MG tablet Take 1,000 mg by mouth every 6 (six) hours as needed for moderate pain or headache.   Yes [provider]  alendronate (FOSAMAX) 70 MG tablet Take 70 mg by mouth every Tuesday. Take with a full glass of water on an empty stomach.    Yes [provider]  buPROPion (WELLBUTRIN XL) 300 MG 24 hr tablet Take 300 mg by mouth every evening.    Yes [provider]  diphenhydrAMINE HCl (ZZZQUIL)  50 MG/30ML LIQD Take 50 mg by mouth at bedtime as needed (sleep).   Yes [provider]  ezetimibe (ZETIA) 10 MG tablet Take 10 mg by mouth daily.   Yes [provider]  HYDROcodone-acetaminophen (NORCO) 10-325 MG tablet Take 1 tablet by mouth every 6 (six) hours as needed for  moderate pain.   Yes [provider]  HYSINGLA ER 20 MG T24A Take 20 mg by mouth daily. 04/14/19  Yes [provider]  levothyroxine (SYNTHROID, LEVOTHROID) 50 MCG tablet Take 50 mcg by mouth daily before breakfast.   Yes [provider]  Menthol, Topical Analgesic, (BIOFREEZE EX) Apply 1 application topically daily as needed (muscle pain).   Yes [provider]  methocarbamol (ROBAXIN) 750 MG tablet Take 1,500 mg by mouth 2 (two) times a day.   Yes [provider]  pantoprazole (PROTONIX) 40 MG tablet Take 40 mg by mouth every evening.    Yes [provider]  sertraline (ZOLOFT) 100 MG tablet Take 100 mg by mouth daily.   Yes [provider]  Vitamin D, Ergocalciferol, (DRISDOL) 1.25 MG (50000 UT) CAPS capsule Take 50,000 Units by mouth every Tuesday.   Yes [provider]  ondansetron (ZOFRAN) 4 MG tablet Take 1 tablet (4 mg total) by mouth every 8 (eight) hours as needed for nausea or vomiting. Patient not taking: Reported on 04/17/2019 03/13/19   Melina Schools, MD     Family History  Problem Relation Age of Onset  . Hypertension Mother   . Cancer - Lung Father     Social History   Socioeconomic History  . Marital status: Married    Spouse name: Not on file  . Number of children: Not on file  . Years of education: Not on file  . Highest education level: Not on file  Occupational History  . Not on file  Social Needs  . Financial resource strain: Not on file  . Food insecurity    Worry: Not on file    Inability: Not on file  . Transportation needs    Medical: Not on file    Non-medical: Not on file  Tobacco Use  . Smoking status: Never Smoker  . Smokeless tobacco: Never Used  Substance and Sexual Activity  . Alcohol use: No  . Drug use: No  . Sexual activity: Not on file  Lifestyle  . Physical activity    Days per week: Not on file    Minutes per session: Not on file  . Stress: Not on file   Relationships  . Social Herbalist on phone: Not on file    Gets together: Not on file    Attends religious service: Not on file    Active member of club or organization: Not on file    Attends meetings of clubs or organizations: Not on file    Relationship status: Not on file  Other Topics Concern  . Not on file  Social History Narrative  . Not on file     Review of Systems: A 12 point ROS discussed and pertinent positives are indicated in the HPI above.  All other systems are negative.  Review of Systems  Constitutional: Negative for activity change, appetite change, fatigue and fever.  Respiratory: Negative for cough and shortness of breath.   Cardiovascular: Negative for chest pain.  Gastrointestinal: Negative for abdominal pain.  Musculoskeletal: Positive for back pain.  Psychiatric/Behavioral: Negative for behavioral problems and confusion.  Vital Signs: BP (!) 142/85   Pulse 74   Temp 98 F (36.7 C) (Oral)   Resp 16   Ht 5\' 2"  (1.575 m)   Wt 160 lb (72.6 kg)   LMP 10/09/1997 (Within Months) Comment: hysterectomy 1999  SpO2 98%   BMI 29.26 kg/m   Physical Exam Vitals signs reviewed.  Cardiovascular:     Rate and Rhythm: Normal rate and regular rhythm.     Heart sounds: Normal heart sounds.  Pulmonary:     Effort: Pulmonary effort is normal.     Breath sounds: Normal breath sounds.  Abdominal:     Palpations: Abdomen is soft.  Musculoskeletal: Normal range of motion.  Skin:    General: Skin is warm and dry.  Neurological:     Mental Status: She is alert and oriented to person, place, and time.  Psychiatric:        Mood and Affect: Mood normal.        Behavior: Behavior normal.        Thought Content: Thought content normal.        Judgment: Judgment normal.     Imaging: No results found.  Labs:  CBC: Recent Labs    09/26/18 1440 01/07/19 1402 03/11/19 1455  WBC 6.0 6.2 6.7  HGB 12.8 13.3 13.0  HCT 40.4 41.8 40.9  PLT 310  313 314    COAGS: Recent Labs    03/11/19 1455 04/23/19 0557  INR 1.1 1.0  APTT 32  --     BMP: Recent Labs    09/26/18 1517 01/07/19 1402 01/07/19 1459 03/11/19 1455  NA 140 140  --  137  K 3.7 4.1  --  3.9  CL 107 104  --  104  CO2 22 24  --  26  GLUCOSE 116* 86  --  96  BUN 10 8  --  8  CALCIUM 8.9 9.4 9.7 9.3  CREATININE 0.87 0.93  --  0.86  GFRNONAA >60 >60  --  >60  GFRAA >60 >60  --  >60    LIVER FUNCTION TESTS: Recent Labs    09/26/18 1517 01/07/19 1402  BILITOT 0.4 0.2*  AST 26 21  ALT 26 20  ALKPHOS 71 104  PROT 6.6 8.1  ALBUMIN 4.0 4.2    TUMOR MARKERS: No results for input(s): AFPTM, CEA, CA199, CHROMGRNA in the last 8760 hours.  Assessment and Plan:  +PET retroperitoneal LAN Scheduled for biopsy of same Risks and benefits of RP LN bx was discussed with the patient and/or patient's family including, but not limited to bleeding, infection, damage to adjacent structures or low yield requiring additional tests.  All of the questions were answered and there is agreement to proceed. Consent signed and in chart.  Thank you for this interesting consult.  I greatly enjoyed meeting Bonnie Parker and look forward to participating in their care.  A copy of this report was sent to the requesting provider on this date.  Electronically Signed: Lavonia Drafts, PA-C 04/23/2019, 7:21 AM   I spent a total of  30 Minutes   in face to face in clinical consultation, greater than 50% of which was counseling/coordinating care for RP LN bx

## 2019-05-22 ENCOUNTER — Encounter: Payer: Self-pay | Admitting: Hematology

## 2019-05-22 ENCOUNTER — Inpatient Hospital Stay: Payer: BC Managed Care – PPO | Attending: Hematology | Admitting: Hematology

## 2019-05-22 DIAGNOSIS — D869 Sarcoidosis, unspecified: Secondary | ICD-10-CM

## 2019-05-22 MED ORDER — VITAMIN D (ERGOCALCIFEROL) 1.25 MG (50000 UNIT) PO CAPS: 50000.0000 [IU] | ORAL_CAPSULE | ORAL | 1 refills | Status: DC

## 2019-05-22 NOTE — Telephone Encounter (Signed)
Contacted patient at Dr. Grier Mitts request. Patient can have phone appt today at 3:20 if patient is in agreement. Patient agreed - message sent to scheduling for appt today.

## 2019-05-23 ENCOUNTER — Telehealth: Payer: Self-pay | Admitting: Hematology

## 2019-05-23 NOTE — Telephone Encounter (Signed)
No los per 8/13. °

## 2019-05-28 NOTE — Progress Notes (Signed)
HEMATOLOGY/ONCOLOGY FOLLOW TELEVISIT NOTE  Date of Service: 05/28/2019  Patient Care Team: Darrol Jump, PA-C as PCP - General (Family Medicine)  CHIEF COMPLAINTS/PURPOSE OF CONSULTATION:  F/u for Retroperitoneal Lymphadenopathy   HISTORY OF PRESENTING ILLNESS:   Bonnie Parker is a wonderful 48 y.o. female who has been referred to [place] by Dr. Melina Schools, her PCP, for evaluation of Lymphadenopathy as seen in lumbar MRI from 12/17/17.   She presents to the clinic today accompanied by her family member. She was having recent back pain and had a MRI of lumbar spine on 12/17/17 which showed changes in her previous decompression and fusion surgeries along with herniated discs in lumbar spine. She also had a incidental finding of retroperitoneal lymphadenopathy.  Her chronic back pain has been going on since 2003 due to a fall. She had back surgery in 2005 and had a laminectomy in 06/2017. She also notes the pain going into her legs that started before her laminectomy. She notes in the last 2 weeks her night sweats have increased. This started right before her laminectomy after use of blood patch.   She also has osteoporosis, Hypothyroidism, extremity swelling which she takes HCTZ for. She is a non-smoker, and rarely drinks. She had a partial hysterectomy in 1999 due to significant endometriosis. She had an oophorectomy in 2002. She denies any pelvic pain since. She denies recent history of infection, cough or cold or prior cancer diagnosis. She notes her mother had cervical cancer. She notes her mother has a blood disorder, cryoglobulin anemia which is now in remission. She was given antibiotics recently in case of UTI in 07/2017. She had a pelvic scan as well at that time.   On review of symptoms, pt chronic back pain, leg pain R>L, recent and increases night sweats, occasional chills, swelling in her hands and feet. She notes she lost 10 pounds but due to use of diet pill, phentermine. She  has been taking phentermine for the past few months. She notes low chest tightness due to back pain recently. She notes constipation that she contributes to pain medication use. She denies recent cough, cold, or infection.   INTERVAL HISTORY   .I connected with [name] on 05/28/19 at  3:20 PM EDT by telephone visit and verified that I am speaking with the correct person using two identifiers.   I discussed the limitations, risks, security and privacy concerns of performing an evaluation and management service by telemedicine and the availability of in-person appointments. I also discussed with the patient that there may be a patient responsible charge related to this service. The patient expressed understanding and agreed to proceed.   Patient's location: Patients home  [facility]   Chief Complaint: f/u to discuss LN biopsy  Patient notes she has an uneventful biopsy. Notes unchanged chronic back pains and chronic fatigue. Discussed in details LN biopsy results showing non caseating granulomas suggestive of sarcoidosis. Discussed in details the diagnosis, natural history of the disease, overall management and indications for treatment and need to see a rheumatologist for further mx of her sarcoidosis.  On review of systems, pt reports feeling tired, back pain, hip pain, and denies any other symptoms.   MEDICAL HISTORY:  Past Medical History:  Diagnosis Date  . Anxiety   . Arthritis   . Asthma   . Depression   . Hypothyroidism   . Lumbar spinal stenosis     SURGICAL HISTORY: Past Surgical History:  Procedure Laterality Date  . ABDOMINAL HYSTERECTOMY  1999  . BACK SURGERY  2005   Dr. Ronnald Ramp  . INNER EAR SURGERY    . LUMBAR LAMINECTOMY/DECOMPRESSION MICRODISCECTOMY N/A 06/21/2017   Procedure: Lumbar decompression L3-4 ;  Surgeon: Melina Schools, MD;  Location: [facility];  Service: Orthopedics;  Laterality: N/A;  3 hrs  . SPINAL CORD  STIMULATOR INSERTION N/A 03/13/2019   Procedure: LUMBAR SPINAL CORD STIMULATOR INSERTION;  Surgeon: Melina Schools, MD;  Location: [facility];  Service: Orthopedics;  Laterality: N/A;  . TONSILLECTOMY      SOCIAL HISTORY: Social History   Socioeconomic History  . Marital status: Married    Spouse name: Not on file  . Number of children: Not on file  . Years of education: Not on file  . Highest education level: Not on file  Occupational History  . Not on file  Social Needs  . Financial resource strain: Not on file  . Food insecurity    Worry: Not on file    Inability: Not on file  . Transportation needs    Medical: Not on file    Non-medical: Not on file  Tobacco Use  . Smoking status: Never Smoker  . Smokeless tobacco: Never Used  Substance and Sexual Activity  . Alcohol use: No  . Drug use: No  . Sexual activity: Not on file  Lifestyle  . Physical activity    Days per week: Not on file    Minutes per session: Not on file  . Stress: Not on file  Relationships  . Social Herbalist on phone: Not on file    Gets together: Not on file    Attends religious service: Not on file    Active member of club or organization: Not on file    Attends meetings of clubs or organizations: Not on file    Relationship status: Not on file  . Intimate partner violence    Fear of current or ex partner: Not on file    Emotionally abused: Not on file    Physically abused: Not on file    Forced sexual activity: Not on file  Other Topics Concern  . Not on file  Social History Narrative  . Not on file    FAMILY HISTORY: Family History  Problem Relation Age of Onset  . Hypertension Mother   . Cancer - Lung Father     ALLERGIES:  is allergic to adhesive [tape].  MEDICATIONS:  Current Outpatient Medications  Medication Sig Dispense Refill  . acetaminophen (TYLENOL) 500 MG tablet Take 1,000 mg by mouth every 6 (six) hours as needed for moderate pain or headache.    .  alendronate (FOSAMAX) 70 MG tablet Take 70 mg by mouth every Tuesday. Take with a full glass of water on an empty stomach.     Marland Kitchen buPROPion (WELLBUTRIN XL) 300 MG 24 hr tablet Take 300 mg by mouth every evening.     . diphenhydrAMINE HCl (ZZZQUIL) 50 MG/30ML LIQD Take 50 mg by mouth at bedtime as needed (sleep).    . ezetimibe (ZETIA) 10 MG tablet Take 10 mg by mouth daily.    Marland Kitchen HYDROcodone-acetaminophen (NORCO) 10-325 MG tablet Take 1 tablet by mouth every 6 (six) hours as needed for moderate pain.    Marland Kitchen HYSINGLA ER 20 MG T24A Take 20 mg by mouth daily.    Marland Kitchen levothyroxine (SYNTHROID, LEVOTHROID) 50 MCG tablet Take 50 mcg by mouth daily before breakfast.    .  Menthol, Topical Analgesic, (BIOFREEZE EX) Apply 1 application topically daily as needed (muscle pain).    . methocarbamol (ROBAXIN) 750 MG tablet Take 1,500 mg by mouth 2 (two) times a day.    . ondansetron (ZOFRAN) 4 MG tablet Take 1 tablet (4 mg total) by mouth every 8 (eight) hours as needed for nausea or vomiting. (Patient not taking: Reported on 04/17/2019) 20 tablet 0  . pantoprazole (PROTONIX) 40 MG tablet Take 40 mg by mouth every evening.     . sertraline (ZOLOFT) 100 MG tablet Take 100 mg by mouth daily.    . Vitamin D, Ergocalciferol, (DRISDOL) 1.25 MG (50000 UT) CAPS capsule Take 1 capsule (50,000 Units total) by mouth 2 (two) times a week. 24 capsule 1   No current facility-administered medications for this visit.     REVIEW OF SYSTEMS:    A 10+ POINT REVIEW OF SYSTEMS WAS OBTAINED including neurology, dermatology, psychiatry, cardiac, respiratory, lymph, extremities, GI, GU, Musculoskeletal, constitutional, breasts, reproductive, HEENT.  All pertinent positives are noted in the HPI.  All others are negative.   PHYSICAL EXAMINATION: ECOG PERFORMANCE STATUS: 1 - Symptomatic but completely ambulatory  . There were no vitals filed for this visit. There were no vitals filed for this visit. .There is no height or weight on file  to calculate BMI.  No Physical Exam, per Phone Visit.  LABORATORY DATA:  I have reviewed the data as listed  . CBC Latest Ref Rng & Units 04/23/2019 03/11/2019 01/07/2019  WBC 4.0 - 10.5 K/uL 7.5 6.7 6.2  Hemoglobin 12.0 - 15.0 g/dL 12.8 13.0 13.3  Hematocrit 36.0 - 46.0 % 39.4 40.9 41.8  Platelets 150 - 400 K/uL 355 314 313   . CBC    Component Value Date/Time   WBC 7.5 04/23/2019 0557   RBC 4.44 04/23/2019 0557   HGB 12.8 04/23/2019 0557   HCT 39.4 04/23/2019 0557   PLT 355 04/23/2019 0557   MCV 88.7 04/23/2019 0557   MCH 28.8 04/23/2019 0557   MCHC 32.5 04/23/2019 0557   RDW 13.6 04/23/2019 0557   LYMPHSABS 2.3 01/07/2019 1402   MONOABS 0.4 01/07/2019 1402   EOSABS 0.4 01/07/2019 1402   BASOSABS 0.1 01/07/2019 1402    . CMP Latest Ref Rng & Units 03/11/2019 01/07/2019 01/07/2019  Glucose 70 - 99 mg/dL 96 86 -  BUN 6 - 20 mg/dL 8 8 -  Creatinine 0.44 - 1.00 mg/dL 0.86 0.93 -  Sodium 135 - 145 mmol/L 137 140 -  Potassium 3.5 - 5.1 mmol/L 3.9 4.1 -  Chloride 98 - 111 mmol/L 104 104 -  CO2 22 - 32 mmol/L 26 24 -  Calcium 8.9 - 10.3 mg/dL 9.3 9.7 9.4  Total Protein 6.5 - 8.1 g/dL - 8.1 -  Total Bilirubin 0.3 - 1.2 mg/dL - 0.2(L) -  Alkaline Phos 38 - 126 U/L - 104 -  AST 15 - 41 U/L - 21 -  ALT 0 - 44 U/L - 20 -   . Lab Results  Component Value Date   LDH 185 01/07/2019     Discussed in details LN biopsy results showing non caseating granulomas suggestive of sarcoidosis. Discussed in details the diagnosis, natural history of the disease, overall management and indications for treatment and need to see a rheumatologist for further mx of her sarcoidosis.  RADIOGRAPHIC STUDIES: I have personally reviewed the radiological images as listed and agreed with the findings in the report. No results found.    12/17/17  ASSESSMENT & PLAN:   JYRA LAGARES is a 48 y.o. caucasian female with   1. Retroperitoneal Lymphadenopathy and now with innumerable bone lesions  noted on MRI spine-- concerning for occult metastatic malignancy.  12/27/17 CT C/A/P revealed 1. Mild porta hepatis, retroperitoneal and bilateral pelvic lymphadenopathy. Lymphoproliferative disorder not excluded. 2. No thoracic adenopathy. 3.  Aortic Atherosclerosis.  03/26/18 CT Abdomen revealed Stable scattered upper abdominal and retroperitoneal and pelvic lymph nodes. The retroperitoneal nodes appears stable since a prior CT myelogram from 2018. These are likely benign.   09/12/18 CT A/P revealed Stable mild abdominal and pelvic lymphadenopathy. Stable small indeterminate splenic lesions.  Outside record 12/13/18 MRI Thoracic spine identified innumerable bone lesions, and could not rule out bone mets or myeloma. Also noted persisting retroperitoneal lymphadenopathy, but comparison to previous imaging was not included.  - labwork from 01/07/19; blood counts and chemistries were normal. Immunoglobulins and serum free light chains all WNL. No M spike. Beta 2, Sed Rate and LDH all normal. PTH and calcium normal. Vitamin D low at 21.7.  -01/14/19 PET/CT which revealed Mild lymphadenopathy in the chest, abdomen, and pelvis is hypermetabolic on today's PET imaging. Comparing back to previous CT scans of 09/12/2018 and 12/27/2017, these lymph nodes have been essentially stable for 1 year with measurements obtained for representative nodes today essentially the same when comparing to measurements obtained on the study from 12/27/2017. The low-density splenic lesions noted on CT scan of 09/12/2018 have evolved when comparing back to the 12/27/2017 CT. While imaging features today could certainly be related to metastatic disease or lymphoma, the relative stability over 1 year would be somewhat unusual given the lack of interval therapy. Sarcoidosis or other lymphoproliferative etiology also considerations. 2. By report, an outside MRI of 12/13/2018 documented "innumerable bone lesions". There is a single subtle  hypermetabolic focus in the B51 vertebral body without associated correlate abnormality on CT imaging. Otherwise, the only other bony hypermetabolism is in the region of the patient's lumbosacral fusion, probably related to the Surgery.  2. Chronic back pain, Osteoporosis -with associated b/l leg pain R>L, lower chest and abdominal tightness -Has undergone 2 spinal surgeries  -Takes Fosamax for osteoporosis -Takes oxycodone prn  3. Hypothyroidism  -On Synthroid -Checks TSH every 6 months with PCP   PLAN:  -patient notes no new clinical symptoms Discussed in details LN biopsy results showing non caseating granulomas suggestive of likely sarcoidosis.. Stains neg for fungal infections or AFB Discussed in details the diagnosis of sarcoidosis, natural history of the disease, overall management and indications for treatment and need to see a rheumatologist for further mx of her sarcoidosis. -would recommend rheumatology referral by PCP for further evaluation and mx of newly diagnosed sarcoidosis. --Continue Vitamin D replacement for bone health optimization and f/u with PCP. Target Vit D levels of 60-80  F/u with PCP -- recommend rheumatology referral through her PCP RTC with Dr Irene Limbo as needed if any new questions arise.   I discussed the assessment and treatment plan with the patient. The patient was provided an opportunity to ask questions and al were answered. The patient agreed with the plan and demonstrated an understanding of the instructions.   The patient was advised to call back or seek an in-person evaluation if the symptoms worsen or if the condition fails to improve as anticipated.  . The total time spent in the appointment was 25 minutes and more than 50% was on counseling and direct patient cares.       Irene Limbo MD [employer] AAHIVMS [facility] Hematology/Oncology Physician [facility]  (Office):       [phone] (Work cell):  [phone] (Fax):            [phone]

## 2019-07-16 ENCOUNTER — Encounter: Payer: Self-pay | Admitting: Hematology

## 2019-07-17 ENCOUNTER — Telehealth: Payer: Self-pay | Admitting: Hematology

## 2019-07-17 NOTE — Telephone Encounter (Signed)
Faxed medical records to [facility] [phone] attn:Jennifer Amalia Hailey, referral recommendation. Release [id number]

## 2019-08-30 ENCOUNTER — Encounter: Payer: Self-pay | Admitting: Hematology

## 2019-09-03 DIAGNOSIS — Z8249 Family history of ischemic heart disease and other diseases of the circulatory system: Secondary | ICD-10-CM | POA: Insufficient documentation

## 2019-09-03 DIAGNOSIS — E785 Hyperlipidemia, unspecified: Secondary | ICD-10-CM | POA: Insufficient documentation

## 2019-10-15 ENCOUNTER — Encounter: Payer: Self-pay | Admitting: Hematology

## 2019-11-04 DIAGNOSIS — H30033 Focal chorioretinal inflammation, peripheral, bilateral: Secondary | ICD-10-CM | POA: Insufficient documentation

## 2019-11-04 DIAGNOSIS — H3581 Retinal edema: Secondary | ICD-10-CM | POA: Insufficient documentation

## 2019-11-04 DIAGNOSIS — H2513 Age-related nuclear cataract, bilateral: Secondary | ICD-10-CM | POA: Insufficient documentation

## 2019-11-11 ENCOUNTER — Telehealth: Payer: Self-pay | Admitting: *Deleted

## 2019-11-11 NOTE — Telephone Encounter (Signed)
Contacted by patient's rheumatologist, Dr. Lanice Schwab, with [facility] [phone]). He requested pathology report of patient's lymph node biopsy results as he cannot visualize results in Care Everywhere. Faxed results from biopsy 04/23/19 (only one in chart) to number given by [facility]  [phone]. Fax confirmation received.

## 2019-12-04 ENCOUNTER — Other Ambulatory Visit: Payer: Self-pay | Admitting: Hematology

## 2019-12-08 DIAGNOSIS — D869 Sarcoidosis, unspecified: Secondary | ICD-10-CM | POA: Insufficient documentation

## 2020-01-01 ENCOUNTER — Other Ambulatory Visit: Payer: Self-pay

## 2020-01-01 ENCOUNTER — Ambulatory Visit: Payer: BC Managed Care – PPO | Admitting: Nurse Practitioner

## 2020-01-01 ENCOUNTER — Encounter: Payer: Self-pay | Admitting: Nurse Practitioner

## 2020-01-01 VITALS — BP 140/78 | HR 81 | Temp 97.9°F | Ht 62.0 in | Wt 168.0 lb

## 2020-01-01 DIAGNOSIS — E034 Atrophy of thyroid (acquired): Secondary | ICD-10-CM

## 2020-01-01 DIAGNOSIS — M5136 Other intervertebral disc degeneration, lumbar region: Secondary | ICD-10-CM

## 2020-01-01 DIAGNOSIS — E782 Mixed hyperlipidemia: Secondary | ICD-10-CM

## 2020-01-01 DIAGNOSIS — F331 Major depressive disorder, recurrent, moderate: Secondary | ICD-10-CM | POA: Diagnosis not present

## 2020-01-01 DIAGNOSIS — Z0000 Encounter for general adult medical examination without abnormal findings: Secondary | ICD-10-CM

## 2020-01-01 MED ORDER — PANTOPRAZOLE SODIUM 40 MG PO TBEC: 40.0000 mg | DELAYED_RELEASE_TABLET | Freq: Every evening | ORAL | 0 refills | Status: DC

## 2020-01-01 MED ORDER — ALENDRONATE SODIUM 70 MG PO TABS: 70.0000 mg | ORAL_TABLET | ORAL | 0 refills | Status: DC

## 2020-01-01 MED ORDER — LEVOTHYROXINE SODIUM 50 MCG PO TABS: 50.0000 ug | ORAL_TABLET | Freq: Every day | ORAL | 0 refills | Status: DC

## 2020-01-01 MED ORDER — BUPROPION HCL ER (XL) 300 MG PO TB24: 300.0000 mg | ORAL_TABLET | Freq: Every day | ORAL | 0 refills | Status: DC

## 2020-01-01 NOTE — Assessment & Plan Note (Addendum)
Not well controlled.  Non statin medication will be started  Patient is unable to use statins.  and Vascepa to control triglycerides Continue to work on eating a healthy diet and exercise.  Labs drawn today.

## 2020-01-01 NOTE — Assessment & Plan Note (Signed)
Well controlled.   PHQ-9 5 No changes to medicines.

## 2020-01-01 NOTE — Progress Notes (Signed)
Established Patient Office Visit  Subjective:  Patient ID: Bonnie Parker, female    DOB: [birth year]  Age: 49 y.o. MRN: [id number]  CC: Patient  is a  49 year  old female. She is here for 3 months follow up.  The patient's medications were reviewed and reconciled since the patient's last visit.  History details were provided by the patient. The history appears to be reliable.   Chief Complaint  Patient presents with  . Hyperlipidemia  . Depression  . Hypothyroidism    HPI Bonnie Parker presents for Hyperlipidemia This is a chronic problem. The current episode started more than 1 year ago. The problem is uncontrolled. Recent lipid tests were reviewed and are high. Exacerbating diseases include obesity. Associated symptoms include myalgias. Pertinent negatives include no shortness of breath. Current antihyperlipidemic treatment includes ezetimibe, diet change and exercise. There are no compliance problems.  Risk factors for coronary artery disease include dyslipidemia and family history.  Total cholesterol 253 mg/dl, Triglycerides 274 mg/dl, HDL 43 mg/dl, LDL 160 mg/dl   Hypothyroidism: Patient presents for follow up  of thyroid function. Patient is currently not experiencing any hypothyroid symptoms. Patient was diagnosed in 2012. Current medication is Levothyroxine 50 MCG once daily. Patient has been compliant and follows up as directed.   Concerning patients Depression: first diagnosed 06/2011. she reports symptoms have been well controlled with current medication regimen. Bupropion 300 mg  Daily. She is compliant with medication regimen and follows up as directed.    Past Medical History:  Diagnosis Date  . Age-related osteoporosis without current pathological fracture   . Anxiety   . Arthritis   . Asthma   . Atrophy of thyroid (acquired)   . Depression   . Drug-induced myopathy   . Gastro-esophageal reflux disease without esophagitis   . Hypothyroidism   . Lumbar spinal  stenosis   . Mixed hyperlipidemia   . Sarcoidosis of other sites     Past Surgical History:  Procedure Laterality Date  . ABDOMINAL HYSTERECTOMY  1999  . BACK SURGERY  2005   Dr. Ronnald Ramp  . INNER EAR SURGERY    . LUMBAR LAMINECTOMY/DECOMPRESSION MICRODISCECTOMY N/A 06/21/2017   Procedure: Lumbar decompression L3-4 ;  Surgeon: Melina Schools, MD;  Location: [place];  Service: Orthopedics;  Laterality: N/A;  3 hrs  . SPINAL CORD STIMULATOR INSERTION N/A 03/13/2019   Procedure: LUMBAR SPINAL CORD STIMULATOR INSERTION;  Surgeon: Melina Schools, MD;  Location: [facility];  Service: Orthopedics;  Laterality: N/A;  . TONSILLECTOMY      Family History  Problem Relation Age of Onset  . Hypertension Mother   . Cancer - Lung Father     Social History   Socioeconomic History  . Marital status: Married    Spouse name: Not on file  . Number of children: 4  . Years of education: Not on file  . Highest education level: Not on file  Occupational History  . Not on file  Tobacco Use  . Smoking status: Never Smoker  . Smokeless tobacco: Never Used  Substance and Sexual Activity  . Alcohol use: No  . Drug use: No  . Sexual activity: Not on file  Other Topics Concern  . Not on file  Social History Narrative  . Not on file   Social Determinants of Health   Financial Resource Strain:   . Difficulty of Paying Living Expenses:   Food Insecurity:   . Worried About Charity fundraiser in the Last  Year:   . Ran Out of Food in the Last Year:   Transportation Needs:   . Film/video editor (Medical):   Marland Kitchen Lack of Transportation (Non-Medical):   Physical Activity:   . Days of Exercise per Week:   . Minutes of Exercise per Session:   Stress:   . Feeling of Stress :   Social Connections:   . Frequency of Communication with Friends and Family:   . Frequency of Social Gatherings with Friends and Family:   . Attends Religious Services:   . Active Member of Clubs or Organizations:   . Attends English as a second language teacher Meetings:   Marland Kitchen Marital Status:   Intimate Partner Violence:   . Fear of Current or Ex-Partner:   . Emotionally Abused:   Marland Kitchen Physically Abused:   . Sexually Abused:     Outpatient Medications Prior to Visit  Medication Sig Dispense Refill  . acetaminophen (TYLENOL) 500 MG tablet Take 1,000 mg by mouth every 6 (six) hours as needed for moderate pain or headache.    Marland Kitchen buPROPion (WELLBUTRIN XL) 300 MG 24 hr tablet Take 300 mg by mouth every evening.     . folic acid (FOLVITE) 1 MG tablet Take 1 mg by mouth daily.    Marland Kitchen HYDROcodone-acetaminophen (NORCO) 10-325 MG tablet Take 1 tablet by mouth every 6 (six) hours as needed for moderate pain.    Marland Kitchen levothyroxine (SYNTHROID, LEVOTHROID) 50 MCG tablet Take 50 mcg by mouth daily before breakfast.    . pantoprazole (PROTONIX) 40 MG tablet Take 40 mg by mouth every evening.     . sertraline (ZOLOFT) 100 MG tablet Take 100 mg by mouth daily.    . Vitamin D, Ergocalciferol, (DRISDOL) 1.25 MG (50000 UNIT) CAPS capsule TAKE 1 CAPSULE BY MOUTH TWICE A WEEK 24 capsule 0  . zolpidem (AMBIEN) 10 MG tablet Take 10 mg by mouth at bedtime as needed.    Marland Kitchen alendronate (FOSAMAX) 70 MG tablet Take 70 mg by mouth every Tuesday. Take with a full glass of water on an empty stomach.     . predniSONE (DELTASONE) 20 MG tablet 10 mg.     . ezetimibe (ZETIA) 10 MG tablet Take 10 mg by mouth daily.    . methotrexate 50 MG/2ML injection SMARTSIG:0.5 Milliliter(s) SUB-Q Once a Week    . predniSONE (DELTASONE) 10 MG tablet TAKE 4 TABLETS BY MOUTH ONCE DAILY FOR 7 DAYS THEN 3 TABLETS ONCE DAILY FOR 7 DAYS THEN 2 & 1 2 TABLETS ONCE DAILY FOR 7 DAYS THEN 2 TABLETS    . baclofen (LIORESAL) 10 MG tablet Take 10 mg by mouth 3 (three) times daily as needed.    . Evolocumab 140 MG/ML SOAJ Inject into the skin.    . methotrexate 2.5 MG tablet TAKE 5 TABLETS BY MOUTH ONCE A WEEK FOR 30 DAYS START WITH 4 TABS WEEKLY. INCREASE TO FULL DOSE THE FOLLOWING WEEK IF TOLERATED      No facility-administered medications prior to visit.    Allergies  Allergen Reactions  . Tape Other (See Comments) and Itching    blisters  . Crestor [Rosuvastatin] Other (See Comments)    MYALGIAS  . Lipitor [Atorvastatin] Other (See Comments)    MYALGIAS  . Other Itching    blisters    ROS Review of Systems  Constitutional: Negative for activity change and appetite change.  HENT: Negative for congestion.   Eyes: Negative for discharge.  Respiratory: Negative for chest tightness and shortness of  breath.   Cardiovascular: Negative for palpitations.  Gastrointestinal: Negative for abdominal distention.  Genitourinary: Negative for difficulty urinating.  Musculoskeletal: Positive for arthralgias and myalgias.  Skin: Negative for rash.  Neurological: Negative for facial asymmetry.  Psychiatric/Behavioral: The patient is not nervous/anxious.       Objective:    Physical Exam  Constitutional: She is oriented to person, place, and time. She appears well-developed and well-nourished.  HENT:  Head: Normocephalic.  Eyes: Conjunctivae are normal.  Cardiovascular: Normal rate, regular rhythm and normal heart sounds.  Pulmonary/Chest: Breath sounds normal.  Abdominal: Soft. Bowel sounds are normal.  Musculoskeletal:        General: Tenderness present.     Cervical back: Normal range of motion.     Comments: Hx of back pain  Neurological: She is alert and oriented to person, place, and time.  Skin: No rash noted.  Psychiatric: Her behavior is normal.    BP 140/78   Pulse 81   Temp 97.9 F (36.6 C)   Ht 5\' 2"  (1.575 m)   Wt 168 lb (76.2 kg)   LMP 10/09/1997 (Within Months) Comment: hysterectomy 1999  SpO2 97%   BMI 30.73 kg/m  Wt Readings from Last 3 Encounters:  01/01/20 168 lb (76.2 kg)  04/23/19 160 lb (72.6 kg)  03/13/19 161 lb 9.6 oz (73.3 kg)     Health Maintenance Due  Topic Date Due  . HIV Screening  Never done  . PAP SMEAR-Modifier  Never done  .  COLONOSCOPY  Never done    There are no preventive care reminders to display for this patient.  No results found for: TSH Lab Results  Component Value Date   WBC 7.5 04/23/2019   HGB 12.8 04/23/2019   HCT 39.4 04/23/2019   MCV 88.7 04/23/2019   PLT 355 04/23/2019   Lab Results  Component Value Date   NA 137 03/11/2019   K 3.9 03/11/2019   CO2 26 03/11/2019   GLUCOSE 96 03/11/2019   BUN 8 03/11/2019   CREATININE 0.86 03/11/2019   BILITOT 0.2 (L) 01/07/2019   ALKPHOS 104 01/07/2019   AST 21 01/07/2019   ALT 20 01/07/2019   PROT 8.1 01/07/2019   ALBUMIN 4.2 01/07/2019   CALCIUM 9.3 03/11/2019   ANIONGAP 7 03/11/2019   No results found for: CHOL No results found for: HDL No results found for: LDLCALC No results found for: TRIG No results found for: CHOLHDL No results found for: HGBA1C    Assessment & Plan:   Major depressive disorder, recurrent episode, moderate ([place]) Well controlled.   PHQ-9 5 No changes to medicines.    Mixed hyperlipidemia Not well controlled.  Non statin medication will be started  Patient is unable to use statins.  and Vascepa to control triglycerides Continue to work on eating a healthy diet and exercise.  Labs drawn today.   Problem List Items Addressed This Visit      Endocrine   Atrophy of thyroid - Primary   Relevant Orders   TSH     Musculoskeletal and Integument   Degeneration of lumbar intervertebral disc   Relevant Medications   methotrexate 50 MG/2ML injection   predniSONE (DELTASONE) 10 MG tablet   alendronate (FOSAMAX) 70 MG tablet (Start on 01/06/2020)     Other   Mixed hyperlipidemia    Not well controlled.  Non statin medication will be started Continue to work on eating a healthy diet and exercise.  Labs drawn today.  Relevant Medications   ezetimibe (ZETIA) 10 MG tablet   Other Relevant Orders   Lipid Panel   CBC with Differential/Platelet   Comprehensive metabolic panel   Major depressive disorder,  recurrent episode, moderate (HCC)    Well controlled.   PHQ-9 5 No changes to medicines.            Meds ordered this encounter  Medications  . alendronate (FOSAMAX) 70 MG tablet    Sig: Take 1 tablet (70 mg total) by mouth every Tuesday. Take with a full glass of water on an empty stomach.    Dispense:  12 tablet    Refill:  0    Order Specific Question:   Supervising Provider    AnswerShelton Silvas    Follow-up: Return in about 3 months (around 04/02/2020).   Spent approximately 20 minutes with patient    Ivy Lynn, NP

## 2020-01-01 NOTE — Patient Instructions (Addendum)
Follow up in 3 months Major depressive disorder, recurrent episode, moderate (HCC) Well controlled.   PHQ-9 5 No changes to medicines.   Mixed hyperlipidemia Not well controlled.  Non statin medication will be started  Patient is unable to use statins.  and Vascepa to control triglycerides Continue to work on eating a healthy diet and exercise.  Labs drawn today.   Hypothyroidism  Hypothyroidism is when the thyroid gland does not make enough of certain hormones (it is underactive). The thyroid gland is a small gland located in the lower front part of the neck, just in front of the windpipe (trachea). This gland makes hormones that help control how the body uses food for energy (metabolism) as well as how the heart and brain function. These hormones also play a role in keeping your bones strong. When the thyroid is underactive, it produces too little of the hormones thyroxine (T4) and triiodothyronine (T3). What are the causes? This condition may be caused by:  Hashimoto's disease. This is a disease in which the body's disease-fighting system (immune system) attacks the thyroid gland. This is the most common cause.  Viral infections.  Pregnancy.  Certain medicines.  Birth defects.  Past radiation treatments to the head or neck for cancer.  Past treatment with radioactive iodine.  Past exposure to radiation in the environment.  Past surgical removal of part or all of the thyroid.  Problems with a gland in the center of the brain (pituitary gland).  Lack of enough iodine in the diet. What increases the risk? You are more likely to develop this condition if:  You are female.  You have a family history of thyroid conditions.  You use a medicine called lithium.  You take medicines that affect the immune system (immunosuppressants). What are the signs or symptoms? Symptoms of this condition include:  Feeling as though you have no energy (lethargy).  Not being able to  tolerate cold.  Weight gain that is not explained by a change in diet or exercise habits.  Lack of appetite.  Dry skin.  Coarse hair.  Menstrual irregularity.  Slowing of thought processes.  Constipation.  Sadness or depression. How is this diagnosed? This condition may be diagnosed based on:  Your symptoms, your medical history, and a physical exam.  Blood tests. You may also have imaging tests, such as an ultrasound or MRI. How is this treated? This condition is treated with medicine that replaces the thyroid hormones that your body does not make. After you begin treatment, it may take several weeks for symptoms to go away. Follow these instructions at home:  Take over-the-counter and prescription medicines only as told by your health care provider.  If you start taking any new medicines, tell your health care provider.  Keep all follow-up visits as told by your health care provider. This is important. ? As your condition improves, your dosage of thyroid hormone medicine may change. ? You will need to have blood tests regularly so that your health care provider can monitor your condition. Contact a health care provider if:  Your symptoms do not get better with treatment.  You are taking thyroid replacement medicine and you: ? Sweat a lot. ? Have tremors. ? Feel anxious. ? Lose weight rapidly. ? Cannot tolerate heat. ? Have emotional swings. ? Have diarrhea. ? Feel weak. Get help right away if you have:  Chest pain.  An irregular heartbeat.  A rapid heartbeat.  Difficulty breathing. Summary  Hypothyroidism is when the  thyroid gland does not make enough of certain hormones (it is underactive).  When the thyroid is underactive, it produces too little of the hormones thyroxine (T4) and triiodothyronine (T3).  The most common cause is Hashimoto's disease, a disease in which the body's disease-fighting system (immune system) attacks the thyroid gland. The  condition can also be caused by viral infections, medicine, pregnancy, or past radiation treatment to the head or neck.  Symptoms may include weight gain, dry skin, constipation, feeling as though you do not have energy, and not being able to tolerate cold.  This condition is treated with medicine to replace the thyroid hormones that your body does not make. This information is not intended to replace advice given to you by your health care provider. Make sure you discuss any questions you have with your health care provider. Document Revised: 09/07/2017 Document Reviewed: 09/05/2017 Elsevier Patient Education  [place].   High Cholesterol  High cholesterol is a condition in which the blood has high levels of a white, waxy, fat-like substance (cholesterol). The human body needs small amounts of cholesterol. The liver makes all the cholesterol that the body needs. Extra (excess) cholesterol comes from the food that we eat. Cholesterol is carried from the liver by the blood through the blood vessels. If you have high cholesterol, deposits (plaques) may build up on the walls of your blood vessels (arteries). Plaques make the arteries narrower and stiffer. Cholesterol plaques increase your risk for heart attack and stroke. Work with your health care provider to keep your cholesterol levels in a healthy range. What increases the risk? This condition is more likely to develop in people who:  Eat foods that are high in animal fat (saturated fat) or cholesterol.  Are overweight.  Are not getting enough exercise.  Have a family history of high cholesterol. What are the signs or symptoms? There are no symptoms of this condition. How is this diagnosed? This condition may be diagnosed from the results of a blood test.  If you are older than age 12, your health care provider may check your cholesterol every 4-6 years.  You may be checked more often if you already have high cholesterol or  other risk factors for heart disease. The blood test for cholesterol measures:  "Bad" cholesterol (LDL cholesterol). This is the main type of cholesterol that causes heart disease. The desired level for LDL is less than 100.  "Good" cholesterol (HDL cholesterol). This type helps to protect against heart disease by cleaning the arteries and carrying the LDL away. The desired level for HDL is 60 or higher.  Triglycerides. These are fats that the body can store or burn for energy. The desired number for triglycerides is lower than 150.  Total cholesterol. This is a measure of the total amount of cholesterol in your blood, including LDL cholesterol, HDL cholesterol, and triglycerides. A healthy number is less than 200. How is this treated? This condition is treated with diet changes, lifestyle changes, and medicines. Diet changes  This may include eating more whole grains, fruits, vegetables, nuts, and fish.  This may also include cutting back on red meat and foods that have a lot of added sugar. Lifestyle changes  Changes may include getting at least 40 minutes of aerobic exercise 3 times a week. Aerobic exercises include walking, biking, and swimming. Aerobic exercise along with a healthy diet can help you maintain a healthy weight.  Changes may also include quitting smoking. Medicines  Medicines are usually  given if diet and lifestyle changes have failed to reduce your cholesterol to healthy levels.  Your health care provider may prescribe a statin medicine. Statin medicines have been shown to reduce cholesterol, which can reduce the risk of heart disease. Follow these instructions at home: Eating and drinking If told by your health care provider:  Eat chicken (without skin), fish, veal, shellfish, ground [place] breast, and round or loin cuts of red meat.  Do not eat fried foods or fatty meats, such as hot dogs and salami.  Eat plenty of fruits, such as apples.  Eat plenty of  vegetables, such as broccoli, potatoes, and carrots.  Eat beans, peas, and lentils.  Eat grains such as barley, rice, couscous, and bulgur wheat.  Eat pasta without cream sauces.  Use skim or nonfat milk, and eat low-fat or nonfat yogurt and cheeses.  Do not eat or drink whole milk, cream, ice cream, egg yolks, or hard cheeses.  Do not eat stick margarine or tub margarines that contain trans fats (also called partially hydrogenated oils).  Do not eat saturated tropical oils, such as coconut oil and palm oil.  Do not eat cakes, cookies, crackers, or other baked goods that contain trans fats.  General instructions  Exercise as directed by your health care provider. Increase your activity level with activities such as gardening, walking, and taking the stairs.  Take over-the-counter and prescription medicines only as told by your health care provider.  Do not use any products that contain nicotine or tobacco, such as cigarettes and e-cigarettes. If you need help quitting, ask your health care provider.  Keep all follow-up visits as told by your health care provider. This is important. Contact a health care provider if:  You are struggling to maintain a healthy diet or weight.  You need help to start on an exercise program.  You need help to stop smoking. Get help right away if:  You have chest pain.  You have trouble breathing. This information is not intended to replace advice given to you by your health care provider. Make sure you discuss any questions you have with your health care provider. Document Revised: 09/28/2017 Document Reviewed: 03/25/2016 Elsevier Patient Education  2020 [place] With Depression Everyone experiences occasional disappointment, sadness, and loss in their lives. When you are feeling down, blue, or sad for at least 2 weeks in a row, it may mean that you have depression. Depression can affect your thoughts and feelings, relationships,  daily activities, and physical health. It is caused by changes in the way your brain functions. If you receive a diagnosis of depression, your health care provider will tell you which type of depression you have and what treatment options are available to you. If you are living with depression, there are ways to help you recover from it and also ways to prevent it from coming back. How to cope with lifestyle changes Coping with stress     Stress is your body's reaction to life changes and events, both good and bad. Stressful situations may include:  Getting married.  The death of a spouse.  Losing a job.  Retiring.  Having a baby. Stress can last just a few hours or it can be ongoing. Stress can play a major role in depression, so it is important to learn both how to cope with stress and how to think about it differently. Talk with your health care provider or a counselor if you would like to learn more  about stress reduction. He or she may suggest some stress reduction techniques, such as:  Music therapy. This can include creating music or listening to music. Choose music that you enjoy and that inspires you.  Mindfulness-based meditation. This kind of meditation can be done while sitting or walking. It involves being aware of your normal breaths, rather than trying to control your breathing.  Centering prayer. This is a kind of meditation that involves focusing on a spiritual word or phrase. Choose a word, phrase, or sacred image that is meaningful to you and that brings you peace.  Deep breathing. To do this, expand your stomach and inhale slowly through your nose. Hold your breath for 3-5 seconds, then exhale slowly, allowing your stomach muscles to relax.  Muscle relaxation. This involves intentionally tensing muscles then relaxing them. Choose a stress reduction technique that fits your lifestyle and personality. Stress reduction techniques take time and practice to develop. Set  aside 5-15 minutes a day to do them. Therapists can offer training in these techniques. The training may be covered by some insurance plans. Other things you can do to manage stress include:  Keeping a stress diary. This can help you learn what triggers your stress and ways to control your response.  Understanding what your limits are and saying no to requests or events that lead to a schedule that is too full.  Thinking about how you respond to certain situations. You may not be able to control everything, but you can control how you react.  Adding humor to your life by watching funny films or TV shows.  Making time for activities that help you relax and not feeling guilty about spending your time this way.  Medicines Your health care provider may suggest certain medicines if he or she feels that they will help improve your condition. Avoid using alcohol and other substances that may prevent your medicines from working properly (may interact). It is also important to:  Talk with your pharmacist or health care provider about all the medicines that you take, their possible side effects, and what medicines are safe to take together.  Make it your goal to take part in all treatment decisions (shared decision-making). This includes giving input on the side effects of medicines. It is best if shared decision-making with your health care provider is part of your total treatment plan. If your health care provider prescribes a medicine, you may not notice the full benefits of it for 4-8 weeks. Most people who are treated for depression need to be on medicine for at least 6-12 months after they feel better. If you are taking medicines as part of your treatment, do not stop taking medicines without first talking to your health care provider. You may need to have the medicine slowly decreased (tapered) over time to decrease the risk of harmful side effects. Relationships Your health care provider may  suggest family therapy along with individual therapy and drug therapy. While there may not be family problems that are causing you to feel depressed, it is still important to make sure your family learns as much as they can about your mental health. Having your family's support can help make your treatment successful. How to recognize changes in your condition Everyone has a different response to treatment for depression. Recovery from major depression happens when you have not had signs of major depression for two months. This may mean that you will start to:  Have more interest in doing activities.  Feel  less hopeless than you did 2 months ago.  Have more energy.  Overeat less often, or have better or improving appetite.  Have better concentration. Your health care provider will work with you to decide the next steps in your recovery. It is also important to recognize when your condition is getting worse. Watch for these signs:  Having fatigue or low energy.  Eating too much or too little.  Sleeping too much or too little.  Feeling restless, agitated, or hopeless.  Having trouble concentrating or making decisions.  Having unexplained physical complaints.  Feeling irritable, angry, or aggressive. Get help as soon as you or your family members notice these symptoms coming back. How to get support and help from others How to talk with friends and family members about your condition  Talking to friends and family members about your condition can provide you with one way to get support and guidance. Reach out to trusted friends or family members, explain your symptoms to them, and let them know that you are working with a health care provider to treat your depression. Financial resources Not all insurance plans cover mental health care, so it is important to check with your insurance carrier. If paying for co-pays or counseling services is a problem, search for a local or county mental  health care center. They may be able to offer public mental health care services at low or no cost when you are not able to see a private health care provider. If you are taking medicine for depression, you may be able to get the generic form, which may be less expensive. Some makers of prescription medicines also offer help to patients who cannot afford the medicines they need. Follow these instructions at home:   Get the right amount and quality of sleep.  Cut down on using caffeine, tobacco, alcohol, and other potentially harmful substances.  Try to exercise, such as walking or lifting small weights.  Take over-the-counter and prescription medicines only as told by your health care provider.  Eat a healthy diet that includes plenty of vegetables, fruits, whole grains, low-fat dairy products, and lean protein. Do not eat a lot of foods that are high in solid fats, added sugars, or salt.  Keep all follow-up visits as told by your health care provider. This is important. Contact a health care provider if:  You stop taking your antidepressant medicines, and you have any of these symptoms: ? Nausea. ? Headache. ? Feeling lightheaded. ? Chills and body aches. ? Not being able to sleep (insomnia).  You or your friends and family think your depression is getting worse. Get help right away if:  You have thoughts of hurting yourself or others. If you ever feel like you may hurt yourself or others, or have thoughts about taking your own life, get help right away. You can go to your nearest emergency department or call:  Your local emergency services (911 in the [place]  A suicide crisis helpline, such as the [facility] at 401-863-5468. This is open 24-hours a day. Summary  If you are living with depression, there are ways to help you recover from it and also ways to prevent it from coming back.  Work with your health care team to create a management plan that  includes counseling, stress management techniques, and healthy lifestyle habits. This information is not intended to replace advice given to you by your health care provider. Make sure you discuss any questions you have with  your health care provider. Document Revised: 01/17/2019 Document Reviewed: 08/28/2016 Elsevier Patient Education  [place].

## 2020-01-01 NOTE — Assessment & Plan Note (Signed)
Well controlled.  No changes to medicines.  Referral: Bone density

## 2020-01-02 LAB — CBC WITH DIFFERENTIAL/PLATELET
Basophils Absolute: 0 10*3/uL (ref 0.0–0.2)
Basos: 1 %
EOS (ABSOLUTE): 0.3 10*3/uL (ref 0.0–0.4)
Eos: 3 %
Hematocrit: 41.2 % (ref 34.0–46.6)
Hemoglobin: 13.4 g/dL (ref 11.1–15.9)
Immature Grans (Abs): 0.1 10*3/uL (ref 0.0–0.1)
Immature Granulocytes: 1 %
Lymphocytes Absolute: 3.5 10*3/uL — ABNORMAL HIGH (ref 0.7–3.1)
Lymphs: 41 %
MCH: 29.8 pg (ref 26.6–33.0)
MCHC: 32.5 g/dL (ref 31.5–35.7)
MCV: 92 fL (ref 79–97)
Monocytes Absolute: 0.6 10*3/uL (ref 0.1–0.9)
Monocytes: 7 %
Neutrophils Absolute: 4.1 10*3/uL (ref 1.4–7.0)
Neutrophils: 47 %
Platelets: 307 10*3/uL (ref 150–450)
RBC: 4.5 x10E6/uL (ref 3.77–5.28)
RDW: 15.4 % (ref 11.7–15.4)
WBC: 8.6 10*3/uL (ref 3.4–10.8)

## 2020-01-02 LAB — COMPREHENSIVE METABOLIC PANEL
ALT: 20 IU/L (ref 0–32)
AST: 11 IU/L (ref 0–40)
Albumin/Globulin Ratio: 2 (ref 1.2–2.2)
Albumin: 4.6 g/dL (ref 3.8–4.8)
Alkaline Phosphatase: 74 IU/L (ref 39–117)
BUN/Creatinine Ratio: 14 (ref 9–23)
BUN: 14 mg/dL (ref 6–24)
Bilirubin Total: 0.3 mg/dL (ref 0.0–1.2)
CO2: 24 mmol/L (ref 20–29)
Calcium: 9.5 mg/dL (ref 8.7–10.2)
Chloride: 101 mmol/L (ref 96–106)
Creatinine, Ser: 0.99 mg/dL (ref 0.57–1.00)
GFR calc Af Amer: 77 mL/min/{1.73_m2} (ref >59)
GFR calc non Af Amer: 67 mL/min/{1.73_m2} (ref >59)
Globulin, Total: 2.3 g/dL (ref 1.5–4.5)
Glucose: 80 mg/dL (ref 65–99)
Potassium: 4.3 mmol/L (ref 3.5–5.2)
Sodium: 140 mmol/L (ref 134–144)
Total Protein: 6.9 g/dL (ref 6.0–8.5)

## 2020-01-02 LAB — LIPID PANEL
Chol/HDL Ratio: 3.4 ratio (ref 0.0–4.4)
Cholesterol, Total: 244 mg/dL — ABNORMAL HIGH (ref 100–199)
HDL: 72 mg/dL (ref >39)
LDL Chol Calc (NIH): 115 mg/dL — ABNORMAL HIGH (ref 0–99)
Triglycerides: 330 mg/dL — ABNORMAL HIGH (ref 0–149)
VLDL Cholesterol Cal: 57 mg/dL — ABNORMAL HIGH (ref 5–40)

## 2020-01-02 LAB — CARDIOVASCULAR RISK ASSESSMENT

## 2020-01-02 LAB — TSH: TSH: 1.56 u[IU]/mL (ref 0.450–4.500)

## 2020-01-06 ENCOUNTER — Other Ambulatory Visit: Payer: Self-pay

## 2020-01-06 MED ORDER — ZOLPIDEM TARTRATE 10 MG PO TABS: 10.0000 mg | ORAL_TABLET | Freq: Every evening | ORAL | 0 refills | Status: DC | PRN

## 2020-01-07 ENCOUNTER — Other Ambulatory Visit: Payer: Self-pay | Admitting: Nurse Practitioner

## 2020-01-07 MED ORDER — ICOSAPENT ETHYL 1 G PO CAPS: 2.0000 g | ORAL_CAPSULE | Freq: Two times a day (BID) | ORAL | 0 refills | Status: DC

## 2020-01-12 ENCOUNTER — Other Ambulatory Visit: Payer: Self-pay | Admitting: Physician Assistant

## 2020-01-12 ENCOUNTER — Other Ambulatory Visit: Payer: Self-pay | Admitting: Nurse Practitioner

## 2020-01-21 ENCOUNTER — Other Ambulatory Visit: Payer: Self-pay

## 2020-01-21 MED ORDER — FENOFIBRATE 160 MG PO TABS: 160.0000 mg | ORAL_TABLET | Freq: Every day | ORAL | 0 refills | Status: DC

## 2020-01-26 ENCOUNTER — Encounter: Payer: Self-pay | Admitting: Nurse Practitioner

## 2020-01-27 ENCOUNTER — Telehealth: Payer: Self-pay

## 2020-01-27 NOTE — Telephone Encounter (Signed)
Vascepa approved from 01/26/2020 to 01/24/2021.  [employer] customer service number [id number]

## 2020-02-02 ENCOUNTER — Other Ambulatory Visit: Payer: Self-pay | Admitting: Physician Assistant

## 2020-02-02 MED ORDER — ICOSAPENT ETHYL 1 G PO CAPS: 2.0000 g | ORAL_CAPSULE | Freq: Two times a day (BID) | ORAL | 2 refills | Status: DC

## 2020-02-09 ENCOUNTER — Encounter: Payer: Self-pay | Admitting: Nurse Practitioner

## 2020-02-09 ENCOUNTER — Other Ambulatory Visit: Payer: Self-pay | Admitting: Physician Assistant

## 2020-02-10 ENCOUNTER — Other Ambulatory Visit: Payer: Self-pay

## 2020-02-12 ENCOUNTER — Other Ambulatory Visit: Payer: Self-pay | Admitting: Physician Assistant

## 2020-02-12 MED ORDER — PANTOPRAZOLE SODIUM 40 MG PO TBEC: 40.0000 mg | DELAYED_RELEASE_TABLET | Freq: Every evening | ORAL | 1 refills | Status: DC

## 2020-02-12 MED ORDER — BUPROPION HCL ER (XL) 300 MG PO TB24: 300.0000 mg | ORAL_TABLET | Freq: Every day | ORAL | 1 refills | Status: DC

## 2020-03-14 DIAGNOSIS — M810 Age-related osteoporosis without current pathological fracture: Secondary | ICD-10-CM | POA: Insufficient documentation

## 2020-04-05 ENCOUNTER — Ambulatory Visit: Payer: BC Managed Care – PPO | Admitting: Nurse Practitioner

## 2020-04-06 ENCOUNTER — Other Ambulatory Visit: Payer: Self-pay

## 2020-04-06 ENCOUNTER — Encounter: Payer: Self-pay | Admitting: Physician Assistant

## 2020-04-06 ENCOUNTER — Ambulatory Visit: Payer: BC Managed Care – PPO | Admitting: Physician Assistant

## 2020-04-06 VITALS — BP 148/90 | HR 88 | Temp 97.9°F | Wt 177.0 lb

## 2020-04-06 DIAGNOSIS — I10 Essential (primary) hypertension: Secondary | ICD-10-CM | POA: Insufficient documentation

## 2020-04-06 DIAGNOSIS — M5136 Other intervertebral disc degeneration, lumbar region: Secondary | ICD-10-CM

## 2020-04-06 DIAGNOSIS — E038 Other specified hypothyroidism: Secondary | ICD-10-CM | POA: Diagnosis not present

## 2020-04-06 DIAGNOSIS — E559 Vitamin D deficiency, unspecified: Secondary | ICD-10-CM | POA: Diagnosis not present

## 2020-04-06 DIAGNOSIS — Z1211 Encounter for screening for malignant neoplasm of colon: Secondary | ICD-10-CM | POA: Insufficient documentation

## 2020-04-06 DIAGNOSIS — D869 Sarcoidosis, unspecified: Secondary | ICD-10-CM

## 2020-04-06 DIAGNOSIS — E782 Mixed hyperlipidemia: Secondary | ICD-10-CM | POA: Diagnosis not present

## 2020-04-06 MED ORDER — ALENDRONATE SODIUM 70 MG PO TABS: 70.0000 mg | ORAL_TABLET | ORAL | 3 refills | Status: DC

## 2020-04-06 MED ORDER — LISINOPRIL 10 MG PO TABS: 10.0000 mg | ORAL_TABLET | Freq: Every day | ORAL | 2 refills | Status: DC

## 2020-04-06 NOTE — Assessment & Plan Note (Signed)
Continue meds as directed

## 2020-04-06 NOTE — Assessment & Plan Note (Signed)
Referral to Dr Melina Copa for screening colonoscopy

## 2020-04-06 NOTE — Assessment & Plan Note (Signed)
Continue meds and follow up with specialists as directed

## 2020-04-06 NOTE — Assessment & Plan Note (Signed)
Vit D level pending - continue current meds

## 2020-04-06 NOTE — Assessment & Plan Note (Signed)
Well controlled.  ?No changes to medicines.  ?Continue to work on eating a healthy diet and exercise.  ?Labs drawn today.  ?

## 2020-04-06 NOTE — Assessment & Plan Note (Signed)
Start lisinopril 10mg  qd - recheck bp 3 weeks labwork pending

## 2020-04-06 NOTE — Progress Notes (Signed)
Established Patient Office Visit  Subjective:  Patient ID: Bonnie Parker, female    DOB: 1971/06/19  Age: 49 y.o. MRN: [id number]  CC:  Chief Complaint  Patient presents with  . Hyperlipidemia    HPI Bonnie Parker presents for hyperlipidemia  Mixed hyperlipidemia  Pt presents with hyperlipidemia. . Compliance with treatment has been goodThe patient is compliant with medications, maintains a low cholesterol diet , follows up as directed , and maintains an exercise regimen . The patient denies experiencing any hypercholesterolemia related symptoms. Pt currently on Zetia 10mg  qd  Pt with history of sarcoidosis - she sees specialist Dr Festus Holts in [place] and also eye specialist Dr Manuella Ghazi - currently on methotrexate weekly for management and prednisone qd  Pt with history of anxiety and depression - she is currently taking zoloft 100mg  qd and wellburtin XL 300mg  qd - states symptoms are stable at this time  Pt with history of osteopenia - currently taking fosamax 70mg  weekly  Pt with history of hypothyroidism- is taking 57mcg qd - voices no problems or concerns  Pt with history of chronic back pain - is being treated with pain medications and robaxin   Past Medical History:  Diagnosis Date  . Age-related osteoporosis without current pathological fracture   . Anxiety   . Arthritis   . Asthma   . Atrophy of thyroid (acquired)   . Depression   . Drug-induced myopathy   . Gastro-esophageal reflux disease without esophagitis   . Hypothyroidism   . Lumbar spinal stenosis   . Mixed hyperlipidemia   . Sarcoidosis of other sites     Past Surgical History:  Procedure Laterality Date  . ABDOMINAL HYSTERECTOMY  1999  . BACK SURGERY  2005   Dr. Ronnald Ramp  . INNER EAR SURGERY    . LUMBAR LAMINECTOMY/DECOMPRESSION MICRODISCECTOMY N/A 06/21/2017   Procedure: Lumbar decompression L3-4 ;  Surgeon: Melina Schools, MD;  Location: [facility];  Service: Orthopedics;  Laterality: N/A;  3 hrs    . SPINAL CORD STIMULATOR INSERTION N/A 03/13/2019   Procedure: LUMBAR SPINAL CORD STIMULATOR INSERTION;  Surgeon: Melina Schools, MD;  Location: [facility];  Service: Orthopedics;  Laterality: N/A;  . TONSILLECTOMY      Family History  Problem Relation Age of Onset  . Hypertension Mother   . Cancer - Lung Father     Social History   Socioeconomic History  . Marital status: Married    Spouse name: Not on file  . Number of children: 4  . Years of education: Not on file  . Highest education level: Not on file  Occupational History  . Not on file  Tobacco Use  . Smoking status: Never Smoker  . Smokeless tobacco: Never Used  Vaping Use  . Vaping Use: Never used  Substance and Sexual Activity  . Alcohol use: No  . Drug use: No  . Sexual activity: Not on file  Other Topics Concern  . Not on file  Social History Narrative  . Not on file   Social Determinants of Health   Financial Resource Strain:   . Difficulty of Paying Living Expenses:   Food Insecurity:   . Worried About Charity fundraiser in the Last Year:   . Arboriculturist in the Last Year:   Transportation Needs:   . Film/video editor (Medical):   Marland Kitchen Lack of Transportation (Non-Medical):   Physical Activity:   . Days of Exercise per Week:   . Minutes  of Exercise per Session:   Stress:   . Feeling of Stress :   Social Connections:   . Frequency of Communication with Friends and Family:   . Frequency of Social Gatherings with Friends and Family:   . Attends Religious Services:   . Active Member of Clubs or Organizations:   . Attends Archivist Meetings:   Marland Kitchen Marital Status:   Intimate Partner Violence:   . Fear of Current or Ex-Partner:   . Emotionally Abused:   Marland Kitchen Physically Abused:   . Sexually Abused:      Current Outpatient Medications:  .  acetaminophen (TYLENOL) 500 MG tablet, Take 1,000 mg by mouth every 6 (six) hours as needed for moderate pain or headache., Disp: , Rfl:  .  alendronate  (FOSAMAX) 70 MG tablet, Take 1 tablet (70 mg total) by mouth every Tuesday. Take with a full glass of water on an empty stomach., Disp: 12 tablet, Rfl: 3 .  buPROPion (WELLBUTRIN XL) 300 MG 24 hr tablet, Take 1 tablet (300 mg total) by mouth daily., Disp: 90 tablet, Rfl: 1 .  ezetimibe (ZETIA) 10 MG tablet, Take 10 mg by mouth daily., Disp: , Rfl:  .  folic acid (FOLVITE) 1 MG tablet, Take 1 mg by mouth daily., Disp: , Rfl:  .  HYDROcodone-acetaminophen (NORCO) 10-325 MG tablet, Take 1 tablet by mouth every 6 (six) hours as needed for moderate pain., Disp: , Rfl:  .  icosapent Ethyl (VASCEPA) 1 g capsule, Take 2 capsules (2 g total) by mouth 2 (two) times daily., Disp: 120 capsule, Rfl: 2 .  levothyroxine (SYNTHROID) 50 MCG tablet, Take 1 tablet (50 mcg total) by mouth daily before breakfast., Disp: 90 tablet, Rfl: 0 .  methocarbamol (ROBAXIN) 750 MG tablet, Take 1,500 mg by mouth 3 (three) times daily., Disp: , Rfl:  .  methotrexate 50 MG/2ML injection, SMARTSIG:0.5 Milliliter(s) SUB-Q Once a Week, Disp: , Rfl:  .  pantoprazole (PROTONIX) 40 MG tablet, Take 1 tablet (40 mg total) by mouth every evening., Disp: 90 tablet, Rfl: 1 .  predniSONE (DELTASONE) 5 MG tablet, TAKE 4 TABLETS BY MOUTH ONCE DAILY FOR 1 WEEK THEN 3&1 2 TABLETS PER DAY FOR 1WEEK THEN 3 TABS PER DAY FOR 1 WEEK THEN 2&1 2 TABS PER DAY FOR 1 WEEK THEN 2 TABS ONCE DAILY AND CHECK IN WITH PHYSICIAN, Disp: , Rfl:  .  sertraline (ZOLOFT) 100 MG tablet, TAKE 1 TABLET BY MOUTH  DAILY, Disp: 90 tablet, Rfl: 0 .  TUBERCULIN SYR 1CC/27GX1/2" 27G X 1/2" 1 ML MISC, 0.5 mLs by Misc.(Non-Drug; Combo Route) route every 7 days. See methotrexate rx for directions., Disp: , Rfl:  .  Vitamin D, Ergocalciferol, (DRISDOL) 1.25 MG (50000 UNIT) CAPS capsule, TAKE 1 CAPSULE BY MOUTH TWICE A WEEK, Disp: 24 capsule, Rfl: 0 .  zolpidem (AMBIEN) 10 MG tablet, Take 1 tablet (10 mg total) by mouth at bedtime as needed., Disp: 30 tablet, Rfl: 0 .  lisinopril  (ZESTRIL) 10 MG tablet, Take 1 tablet (10 mg total) by mouth daily., Disp: 30 tablet, Rfl: 2   Allergies  Allergen Reactions  . Tape Other (See Comments) and Itching    blisters  . Crestor [Rosuvastatin] Other (See Comments)    MYALGIAS  . Lipitor [Atorvastatin] Other (See Comments)    MYALGIAS  . Other Itching    blisters    ROS CONSTITUTIONAL: Negative for chills, fatigue, fever, unintentional weight gain and unintentional weight loss.  E/N/T: Negative for  ear pain, nasal congestion and sore throat.  CARDIOVASCULAR: Negative for chest pain, dizziness, palpitations and pedal edema.  RESPIRATORY: Negative for recent cough and dyspnea.  GASTROINTESTINAL: Negative for abdominal pain, acid reflux symptoms, constipation, diarrhea, nausea and vomiting.  MSK: see HPI INTEGUMENTARY: Negative for rash.  NEUROLOGICAL: Negative for dizziness and headaches.  PSYCHIATRIC: Negative for sleep disturbance and to question depression screen.  Negative for depression, negative for anhedonia.        Objective:    PHYSICAL EXAM:   VS: BP (!) 148/90 (BP Location: Left Arm, Patient Position: Sitting)   Pulse 88   Temp 97.9 F (36.6 C) (Temporal)   Wt 177 lb (80.3 kg)   LMP 10/09/1997 (Within Months) Comment: hysterectomy 1999  SpO2 99%   BMI 32.37 kg/m   GEN: Well nourished, well developed, in no acute distress   Cardiac: RRR; no murmurs, rubs, or gallops,no edema - no significant varicosities Respiratory:  normal respiratory rate and pattern with no distress - normal breath sounds with no rales, rhonchi, wheezes or rubs  MS: no deformity or atrophy  Skin: warm and dry, no rash  Neuro:  Alert and Oriented x 3, Strength and sensation are intact - CN II-Xii grossly intact Psych: euthymic mood, appropriate affect and demeanor  BP (!) 148/90 (BP Location: Left Arm, Patient Position: Sitting)   Pulse 88   Temp 97.9 F (36.6 C) (Temporal)   Wt 177 lb (80.3 kg)   LMP 10/09/1997 (Within  Months) Comment: hysterectomy 1999  SpO2 99%   BMI 32.37 kg/m  Wt Readings from Last 3 Encounters:  04/06/20 177 lb (80.3 kg)  01/01/20 168 lb (76.2 kg)  04/23/19 160 lb (72.6 kg)     Health Maintenance Due  Topic Date Due  . HIV Screening  Never done  . PAP SMEAR-Modifier  Never done  . COLONOSCOPY  Never done    There are no preventive care reminders to display for this patient.  Lab Results  Component Value Date   TSH 1.560 01/01/2020   Lab Results  Component Value Date   WBC 8.6 01/01/2020   HGB 13.4 01/01/2020   HCT 41.2 01/01/2020   MCV 92 01/01/2020   PLT 307 01/01/2020   Lab Results  Component Value Date   NA 140 01/01/2020   K 4.3 01/01/2020   CO2 24 01/01/2020   GLUCOSE 80 01/01/2020   BUN 14 01/01/2020   CREATININE 0.99 01/01/2020   BILITOT 0.3 01/01/2020   ALKPHOS 74 01/01/2020   AST 11 01/01/2020   ALT 20 01/01/2020   PROT 6.9 01/01/2020   ALBUMIN 4.6 01/01/2020   CALCIUM 9.5 01/01/2020   ANIONGAP 7 03/11/2019   Lab Results  Component Value Date   CHOL 244 (H) 01/01/2020   Lab Results  Component Value Date   HDL 72 01/01/2020   Lab Results  Component Value Date   LDLCALC 115 (H) 01/01/2020   Lab Results  Component Value Date   TRIG 330 (H) 01/01/2020   Lab Results  Component Value Date   CHOLHDL 3.4 01/01/2020   No results found for: HGBA1C    Assessment & Plan:   Problem List Items Addressed This Visit      Cardiovascular and Mediastinum   Benign hypertension    Start lisinopril 10mg  qd - recheck bp 3 weeks labwork pending      Relevant Medications   lisinopril (ZESTRIL) 10 MG tablet   Other Relevant Orders   CBC with Differential/Platelet  Comprehensive metabolic panel     Endocrine   Other specified hypothyroidism    Labs pending - continue current meds      Relevant Orders   TSH     Musculoskeletal and Integument   Degeneration of lumbar intervertebral disc    Continue meds as directed      Relevant  Medications   methocarbamol (ROBAXIN) 750 MG tablet   predniSONE (DELTASONE) 5 MG tablet   alendronate (FOSAMAX) 70 MG tablet     Other   Sarcoidosis    Continue meds and follow up with specialists as directed      Mixed hyperlipidemia - Primary    Well controlled.  No changes to medicines.  Continue to work on eating a healthy diet and exercise.  Labs drawn today.        Relevant Medications   lisinopril (ZESTRIL) 10 MG tablet   Other Relevant Orders   Lipid panel   Vitamin D insufficiency    Vit D level pending - continue current meds      Relevant Orders   VITAMIN D 25 Hydroxy (Vit-D Deficiency, Fractures)   Screen for colon cancer    Referral to Dr Melina Copa for screening colonoscopy      Relevant Orders   Ambulatory referral to Gastroenterology      Meds ordered this encounter  Medications  . lisinopril (ZESTRIL) 10 MG tablet    Sig: Take 1 tablet (10 mg total) by mouth daily.    Dispense:  30 tablet    Refill:  2    Order Specific Question:   Supervising Provider    AnswerRochel Brome [id number]  . alendronate (FOSAMAX) 70 MG tablet    Sig: Take 1 tablet (70 mg total) by mouth every Tuesday. Take with a full glass of water on an empty stomach.    Dispense:  12 tablet    Refill:  3    Order Specific Question:   Supervising Provider    AnswerShelton Silvas    Follow-up: Return in about 3 months (around 07/07/2020) for chronic visit and in 3 weeks nurse visit bp check.    SARA R Jahquan Klugh, PA-C

## 2020-04-06 NOTE — Assessment & Plan Note (Signed)
Labs pending - continue current meds

## 2020-04-09 LAB — LIPID PANEL
Chol/HDL Ratio: 3.5 ratio (ref 0.0–4.4)
Cholesterol, Total: 197 mg/dL (ref 100–199)
HDL: 56 mg/dL (ref >39)
LDL Chol Calc (NIH): 107 mg/dL — ABNORMAL HIGH (ref 0–99)
Triglycerides: 196 mg/dL — ABNORMAL HIGH (ref 0–149)
VLDL Cholesterol Cal: 34 mg/dL (ref 5–40)

## 2020-04-09 LAB — COMPREHENSIVE METABOLIC PANEL
ALT: 21 IU/L (ref 0–32)
AST: 15 IU/L (ref 0–40)
Albumin/Globulin Ratio: 1.9 (ref 1.2–2.2)
Albumin: 4.6 g/dL (ref 3.8–4.8)
Alkaline Phosphatase: 71 IU/L (ref 48–121)
BUN/Creatinine Ratio: 11 (ref 9–23)
BUN: 11 mg/dL (ref 6–24)
Bilirubin Total: <0.2 mg/dL (ref 0.0–1.2)
CO2: 25 mmol/L (ref 20–29)
Calcium: 9.4 mg/dL (ref 8.7–10.2)
Chloride: 103 mmol/L (ref 96–106)
Creatinine, Ser: 0.98 mg/dL (ref 0.57–1.00)
GFR calc Af Amer: 78 mL/min/{1.73_m2} (ref >59)
GFR calc non Af Amer: 67 mL/min/{1.73_m2} (ref >59)
Globulin, Total: 2.4 g/dL (ref 1.5–4.5)
Glucose: 88 mg/dL (ref 65–99)
Potassium: 4.7 mmol/L (ref 3.5–5.2)
Sodium: 143 mmol/L (ref 134–144)
Total Protein: 7 g/dL (ref 6.0–8.5)

## 2020-04-09 LAB — CBC WITH DIFFERENTIAL/PLATELET
Basophils Absolute: 0.1 10*3/uL (ref 0.0–0.2)
Basos: 1 %
EOS (ABSOLUTE): 0.4 10*3/uL (ref 0.0–0.4)
Eos: 4 %
Hematocrit: 40.7 % (ref 34.0–46.6)
Hemoglobin: 13 g/dL (ref 11.1–15.9)
Immature Grans (Abs): 0.1 10*3/uL (ref 0.0–0.1)
Immature Granulocytes: 1 %
Lymphocytes Absolute: 2.4 10*3/uL (ref 0.7–3.1)
Lymphs: 28 %
MCH: 30.4 pg (ref 26.6–33.0)
MCHC: 31.9 g/dL (ref 31.5–35.7)
MCV: 95 fL (ref 79–97)
Monocytes Absolute: 0.7 10*3/uL (ref 0.1–0.9)
Monocytes: 8 %
Neutrophils Absolute: 5 10*3/uL (ref 1.4–7.0)
Neutrophils: 58 %
Platelets: 347 10*3/uL (ref 150–450)
RBC: 4.28 x10E6/uL (ref 3.77–5.28)
RDW: 14 % (ref 11.7–15.4)
WBC: 8.5 10*3/uL (ref 3.4–10.8)

## 2020-04-09 LAB — CARDIOVASCULAR RISK ASSESSMENT

## 2020-04-09 LAB — VITAMIN D 25 HYDROXY (VIT D DEFICIENCY, FRACTURES): Vit D, 25-Hydroxy: 37.9 ng/mL (ref 30.0–100.0)

## 2020-04-09 LAB — TSH: TSH: 1.53 u[IU]/mL (ref 0.450–4.500)

## 2020-04-20 ENCOUNTER — Other Ambulatory Visit: Payer: Self-pay

## 2020-04-20 MED ORDER — ZOLPIDEM TARTRATE 10 MG PO TABS: 10.0000 mg | ORAL_TABLET | Freq: Every evening | ORAL | 0 refills | Status: DC | PRN

## 2020-04-23 ENCOUNTER — Other Ambulatory Visit: Payer: Self-pay | Admitting: Physician Assistant

## 2020-04-26 ENCOUNTER — Other Ambulatory Visit: Payer: Self-pay

## 2020-04-26 MED ORDER — LEVOTHYROXINE SODIUM 50 MCG PO TABS: 50.0000 ug | ORAL_TABLET | Freq: Every day | ORAL | 0 refills | Status: DC

## 2020-04-27 ENCOUNTER — Ambulatory Visit: Payer: BC Managed Care – PPO

## 2020-04-29 ENCOUNTER — Other Ambulatory Visit: Payer: Self-pay | Admitting: Physician Assistant

## 2020-04-29 MED ORDER — LEVOTHYROXINE SODIUM 50 MCG PO TABS: 50.0000 ug | ORAL_TABLET | Freq: Every day | ORAL | 1 refills | Status: DC

## 2020-05-11 ENCOUNTER — Other Ambulatory Visit: Payer: Self-pay | Admitting: Physician Assistant

## 2020-05-11 DIAGNOSIS — I10 Essential (primary) hypertension: Secondary | ICD-10-CM

## 2020-05-13 ENCOUNTER — Other Ambulatory Visit: Payer: Self-pay | Admitting: Hematology

## 2020-06-21 ENCOUNTER — Other Ambulatory Visit: Payer: Self-pay | Admitting: Hematology

## 2020-07-14 ENCOUNTER — Other Ambulatory Visit: Payer: Self-pay

## 2020-07-14 ENCOUNTER — Encounter: Payer: Self-pay | Admitting: Physician Assistant

## 2020-07-14 ENCOUNTER — Ambulatory Visit: Payer: BC Managed Care – PPO | Admitting: Physician Assistant

## 2020-07-14 VITALS — BP 138/82 | HR 91 | Temp 97.8°F | Ht 62.0 in | Wt 176.0 lb

## 2020-07-14 DIAGNOSIS — Z23 Encounter for immunization: Secondary | ICD-10-CM | POA: Diagnosis not present

## 2020-07-14 DIAGNOSIS — E559 Vitamin D deficiency, unspecified: Secondary | ICD-10-CM

## 2020-07-14 DIAGNOSIS — I10 Essential (primary) hypertension: Secondary | ICD-10-CM | POA: Diagnosis not present

## 2020-07-14 DIAGNOSIS — Z1211 Encounter for screening for malignant neoplasm of colon: Secondary | ICD-10-CM | POA: Diagnosis not present

## 2020-07-14 DIAGNOSIS — E038 Other specified hypothyroidism: Secondary | ICD-10-CM

## 2020-07-14 DIAGNOSIS — E785 Hyperlipidemia, unspecified: Secondary | ICD-10-CM

## 2020-07-14 MED ORDER — CLOTRIMAZOLE-BETAMETHASONE 1-0.05 % EX CREA: 1.0000 "application " | TOPICAL_CREAM | Freq: Two times a day (BID) | CUTANEOUS | 0 refills | Status: DC

## 2020-07-14 NOTE — Progress Notes (Signed)
Established Patient Office Visit  Subjective:  Patient ID: Bonnie Parker, female    DOB: August 20, 1971  Age: 49 y.o. MRN: [id number]  CC:  Chief Complaint  Patient presents with  . Hypertension    HPI Bonnie Parker presents for hyperlipidemia  Mixed hyperlipidemia  Pt presents with hyperlipidemia. . Compliance with treatment has been goodThe patient is compliant with medications, maintains a low cholesterol diet , follows up as directed , and maintains an exercise regimen . The patient denies experiencing any hypercholesterolemia related symptoms. Pt currently on Zetia 10mg  qd  Pt with history of sarcoidosis - she sees specialist Dr Festus Holts in [place] and also eye specialist Dr Manuella Ghazi - currently on methotrexate weekly for management and prednisone qd  Pt with history of anxiety and depression - she is currently taking zoloft 100mg  qd and wellburtin XL 300mg  qd - states symptoms are stable at this time  Pt with history of osteopenia - currently taking fosamax 70mg  weekly  Pt with history of hypothyroidism- is taking 72mcg qd - voices no problems or concerns  Pt with history of chronic back pain - is being treated with pain medications and robaxin   Past Medical History:  Diagnosis Date  . Age-related osteoporosis without current pathological fracture   . Anxiety   . Arthritis   . Asthma   . Atrophy of thyroid (acquired)   . Depression   . Drug-induced myopathy   . Gastro-esophageal reflux disease without esophagitis   . Hypothyroidism   . Lumbar spinal stenosis   . Mixed hyperlipidemia   . Polyarthralgia   . Sarcoidosis of other sites     Past Surgical History:  Procedure Laterality Date  . ABDOMINAL HYSTERECTOMY  1999  . BACK SURGERY  2005   Dr. Ronnald Ramp  . INNER EAR SURGERY    . LUMBAR LAMINECTOMY/DECOMPRESSION MICRODISCECTOMY N/A 06/21/2017   Procedure: Lumbar decompression L3-4 ;  Surgeon: Melina Schools, MD;  Location: [facility];  Service: Orthopedics;   Laterality: N/A;  3 hrs  . SPINAL CORD STIMULATOR INSERTION N/A 03/13/2019   Procedure: LUMBAR SPINAL CORD STIMULATOR INSERTION;  Surgeon: Melina Schools, MD;  Location: [facility];  Service: Orthopedics;  Laterality: N/A;  . TONSILLECTOMY      Family History  Problem Relation Age of Onset  . Hypertension Mother   . Cancer - Lung Father     Social History   Socioeconomic History  . Marital status: Married    Spouse name: Not on file  . Number of children: 4  . Years of education: Not on file  . Highest education level: Not on file  Occupational History  . Not on file  Tobacco Use  . Smoking status: Never Smoker  . Smokeless tobacco: Never Used  Vaping Use  . Vaping Use: Never used  Substance and Sexual Activity  . Alcohol use: No  . Drug use: No  . Sexual activity: Not on file  Other Topics Concern  . Not on file  Social History Narrative  . Not on file   Social Determinants of Health   Financial Resource Strain:   . Difficulty of Paying Living Expenses: Not on file  Food Insecurity:   . Worried About Charity fundraiser in the Last Year: Not on file  . Ran Out of Food in the Last Year: Not on file  Transportation Needs:   . Lack of Transportation (Medical): Not on file  . Lack of Transportation (Non-Medical): Not on file  Physical  Activity:   . Days of Exercise per Week: Not on file  . Minutes of Exercise per Session: Not on file  Stress:   . Feeling of Stress : Not on file  Social Connections:   . Frequency of Communication with Friends and Family: Not on file  . Frequency of Social Gatherings with Friends and Family: Not on file  . Attends Religious Services: Not on file  . Active Member of Clubs or Organizations: Not on file  . Attends Archivist Meetings: Not on file  . Marital Status: Not on file  Intimate Partner Violence:   . Fear of Current or Ex-Partner: Not on file  . Emotionally Abused: Not on file  . Physically Abused: Not on file  .  Sexually Abused: Not on file     Current Outpatient Medications:  .  acetaminophen (TYLENOL) 500 MG tablet, Take 1,000 mg by mouth every 6 (six) hours as needed for moderate pain or headache., Disp: , Rfl:  .  alendronate (FOSAMAX) 70 MG tablet, Take 1 tablet (70 mg total) by mouth every Tuesday. Take with a full glass of water on an empty stomach., Disp: 12 tablet, Rfl: 3 .  buPROPion (WELLBUTRIN XL) 300 MG 24 hr tablet, Take 1 tablet (300 mg total) by mouth daily., Disp: 90 tablet, Rfl: 1 .  ezetimibe (ZETIA) 10 MG tablet, Take 10 mg by mouth daily., Disp: , Rfl:  .  folic acid (FOLVITE) 1 MG tablet, Take 1 mg by mouth daily., Disp: , Rfl:  .  HYDROcodone-acetaminophen (NORCO) 10-325 MG tablet, Take 1 tablet by mouth every 6 (six) hours as needed for moderate pain., Disp: , Rfl:  .  ibuprofen (ADVIL) 200 MG tablet, Take 200 mg by mouth every 6 (six) hours as needed., Disp: , Rfl:  .  icosapent Ethyl (VASCEPA) 1 g capsule, Take 2 capsules (2 g total) by mouth 2 (two) times daily., Disp: 120 capsule, Rfl: 2 .  levothyroxine (SYNTHROID) 50 MCG tablet, Take 1 tablet (50 mcg total) by mouth daily before breakfast., Disp: 90 tablet, Rfl: 1 .  lisinopril (ZESTRIL) 10 MG tablet, Take 1 tablet by mouth once daily, Disp: 90 tablet, Rfl: 0 .  methocarbamol (ROBAXIN) 750 MG tablet, Take 1,500 mg by mouth 3 (three) times daily., Disp: , Rfl:  .  Methotrexate Sodium (METHOTREXATE, PF,) 50 MG/2ML injection, , Disp: , Rfl:  .  pantoprazole (PROTONIX) 40 MG tablet, Take 1 tablet (40 mg total) by mouth every evening., Disp: 90 tablet, Rfl: 1 .  sertraline (ZOLOFT) 100 MG tablet, TAKE 1 TABLET BY MOUTH  DAILY, Disp: 90 tablet, Rfl: 3 .  TUBERCULIN SYR 1CC/27GX1/2" 27G X 1/2" 1 ML MISC, 0.5 mLs by Misc.(Non-Drug; Combo Route) route every 7 days. See methotrexate rx for directions., Disp: , Rfl:  .  Vitamin D, Ergocalciferol, (DRISDOL) 1.25 MG (50000 UNIT) CAPS capsule, TAKE 1 CAPSULE BY MOUTH TWICE A WEEK, Disp:  24 capsule, Rfl: 0 .  zolpidem (AMBIEN) 10 MG tablet, TAKE 1 TABLET BY MOUTH AT  BEDTIME AS NEEDED, Disp: 30 tablet, Rfl: 1 .  clotrimazole-betamethasone (LOTRISONE) cream, Apply 1 application topically 2 (two) times daily., Disp: 30 g, Rfl: 0   Allergies  Allergen Reactions  . Tape Other (See Comments) and Itching    blisters  . Crestor [Rosuvastatin] Other (See Comments)    MYALGIAS  . Lipitor [Atorvastatin] Other (See Comments)    MYALGIAS  . Other Itching    blisters    ROS  CONSTITUTIONAL: Negative for chills, fatigue, fever, unintentional weight gain and unintentional weight loss.  E/N/T: Negative for ear pain, nasal congestion and sore throat.  CARDIOVASCULAR: Negative for chest pain, dizziness, palpitations and pedal edema.  RESPIRATORY: Negative for recent cough and dyspnea.  GASTROINTESTINAL: Negative for abdominal pain, acid reflux symptoms, constipation, diarrhea, nausea and vomiting.  MSK: see HPI INTEGUMENTARY: Negative for rash.  NEUROLOGICAL: Negative for dizziness and headaches.  PSYCHIATRIC: Negative for sleep disturbance and to question depression screen.  Negative for depression, negative for anhedonia.        Objective:    PHYSICAL EXAM:   VS: BP 138/82 (BP Location: Left Arm, Patient Position: Sitting, Cuff Size: Normal)   Pulse 91   Temp 97.8 F (36.6 C) (Temporal)   Ht 5\' 2"  (1.575 m)   Wt 176 lb (79.8 kg)   LMP 10/09/1997 (Within Months) Comment: hysterectomy 1999  SpO2 99%   BMI 32.19 kg/m   GEN: Well nourished, well developed, in no acute distress   Cardiac: RRR; no murmurs, rubs, or gallops,no edema - no significant varicosities Respiratory:  normal respiratory rate and pattern with no distress - normal breath sounds with no rales, rhonchi, wheezes or rubs  MS: no deformity or atrophy  Skin: warm and dry, no rash  Neuro:  Alert and Oriented x 3, Strength and sensation are intact - CN II-Xii grossly intact Psych: euthymic mood,  appropriate affect and demeanor  BP 138/82 (BP Location: Left Arm, Patient Position: Sitting, Cuff Size: Normal)   Pulse 91   Temp 97.8 F (36.6 C) (Temporal)   Ht 5\' 2"  (1.575 m)   Wt 176 lb (79.8 kg)   LMP 10/09/1997 (Within Months) Comment: hysterectomy 1999  SpO2 99%   BMI 32.19 kg/m  Wt Readings from Last 3 Encounters:  07/14/20 176 lb (79.8 kg)  04/06/20 177 lb (80.3 kg)  01/01/20 168 lb (76.2 kg)     Health Maintenance Due  Topic Date Due  . PAP SMEAR-Modifier  Never done  . COLONOSCOPY  Never done  . INFLUENZA VACCINE  05/09/2020    There are no preventive care reminders to display for this patient.  Lab Results  Component Value Date   TSH 1.530 04/06/2020   Lab Results  Component Value Date   WBC 8.5 04/06/2020   HGB 13.0 04/06/2020   HCT 40.7 04/06/2020   MCV 95 04/06/2020   PLT 347 04/06/2020   Lab Results  Component Value Date   NA 143 04/06/2020   K 4.7 04/06/2020   CO2 25 04/06/2020   GLUCOSE 88 04/06/2020   BUN 11 04/06/2020   CREATININE 0.98 04/06/2020   BILITOT <0.2 04/06/2020   ALKPHOS 71 04/06/2020   AST 15 04/06/2020   ALT 21 04/06/2020   PROT 7.0 04/06/2020   ALBUMIN 4.6 04/06/2020   CALCIUM 9.4 04/06/2020   ANIONGAP 7 03/11/2019   Lab Results  Component Value Date   CHOL 197 04/06/2020   Lab Results  Component Value Date   HDL 56 04/06/2020   Lab Results  Component Value Date   LDLCALC 107 (H) 04/06/2020   Lab Results  Component Value Date   TRIG 196 (H) 04/06/2020   Lab Results  Component Value Date   CHOLHDL 3.5 04/06/2020   No results found for: HGBA1C    Assessment & Plan:   Problem List Items Addressed This Visit      Cardiovascular and Mediastinum   Benign hypertension - Primary    Well  controlled.  No changes to medicines.  Continue to work on eating a healthy diet and exercise.  Labs drawn today.        Relevant Orders   CBC with Differential/Platelet   Comprehensive metabolic panel      Endocrine   Other specified hypothyroidism    labwork pending  Continue current meds      Relevant Orders   TSH     Other   Dyslipidemia   Relevant Orders   Lipid panel   Vitamin D insufficiency    labwork pending      Relevant Orders   VITAMIN D 25 Hydroxy (Vit-D Deficiency, Fractures)   Screen for colon cancer    Referral to dr Melina Copa      Relevant Orders   Ambulatory referral to Gastroenterology   Need for prophylactic vaccination and inoculation against influenza    flucelvax given      Relevant Orders   Flu Vaccine MDCK QUAD PF      Meds ordered this encounter  Medications  . clotrimazole-betamethasone (LOTRISONE) cream    Sig: Apply 1 application topically 2 (two) times daily.    Dispense:  30 g    Refill:  0    Order Specific Question:   Supervising Provider    Answer:   Shelton Silvas    Follow-up: Return in about 3 months (around 10/14/2020) for fasting physical/ pap.    SARA R Javarion Douty, PA-C

## 2020-07-14 NOTE — Assessment & Plan Note (Signed)
Well controlled.  ?No changes to medicines.  ?Continue to work on eating a healthy diet and exercise.  ?Labs drawn today.  ?

## 2020-07-14 NOTE — Assessment & Plan Note (Signed)
labwork pending °Continue current meds °

## 2020-07-14 NOTE — Assessment & Plan Note (Signed)
labwork pending 

## 2020-07-14 NOTE — Assessment & Plan Note (Signed)
flucelvax given 

## 2020-07-14 NOTE — Assessment & Plan Note (Signed)
Referral to dr Melina Copa

## 2020-07-15 LAB — CBC WITH DIFFERENTIAL/PLATELET
Basophils Absolute: 0.1 10*3/uL (ref 0.0–0.2)
Basos: 1 %
EOS (ABSOLUTE): 0.4 10*3/uL (ref 0.0–0.4)
Eos: 6 %
Hematocrit: 38.5 % (ref 34.0–46.6)
Hemoglobin: 12.8 g/dL (ref 11.1–15.9)
Immature Grans (Abs): 0 10*3/uL (ref 0.0–0.1)
Immature Granulocytes: 0 %
Lymphocytes Absolute: 1.7 10*3/uL (ref 0.7–3.1)
Lymphs: 26 %
MCH: 30.4 pg (ref 26.6–33.0)
MCHC: 33.2 g/dL (ref 31.5–35.7)
MCV: 91 fL (ref 79–97)
Monocytes Absolute: 0.6 10*3/uL (ref 0.1–0.9)
Monocytes: 9 %
Neutrophils Absolute: 3.7 10*3/uL (ref 1.4–7.0)
Neutrophils: 58 %
Platelets: 345 10*3/uL (ref 150–450)
RBC: 4.21 x10E6/uL (ref 3.77–5.28)
RDW: 14.7 % (ref 11.7–15.4)
WBC: 6.4 10*3/uL (ref 3.4–10.8)

## 2020-07-15 LAB — LIPID PANEL
Chol/HDL Ratio: 5.6 ratio — ABNORMAL HIGH (ref 0.0–4.4)
Cholesterol, Total: 219 mg/dL — ABNORMAL HIGH (ref 100–199)
HDL: 39 mg/dL — ABNORMAL LOW (ref >39)
LDL Chol Calc (NIH): 128 mg/dL — ABNORMAL HIGH (ref 0–99)
Triglycerides: 293 mg/dL — ABNORMAL HIGH (ref 0–149)
VLDL Cholesterol Cal: 52 mg/dL — ABNORMAL HIGH (ref 5–40)

## 2020-07-15 LAB — TSH: TSH: 0.971 u[IU]/mL (ref 0.450–4.500)

## 2020-07-15 LAB — COMPREHENSIVE METABOLIC PANEL
ALT: 17 IU/L (ref 0–32)
AST: 13 IU/L (ref 0–40)
Albumin/Globulin Ratio: 1.8 (ref 1.2–2.2)
Albumin: 4.6 g/dL (ref 3.8–4.8)
Alkaline Phosphatase: 78 IU/L (ref 44–121)
BUN/Creatinine Ratio: 10 (ref 9–23)
BUN: 9 mg/dL (ref 6–24)
Bilirubin Total: 0.3 mg/dL (ref 0.0–1.2)
CO2: 19 mmol/L — ABNORMAL LOW (ref 20–29)
Calcium: 8.9 mg/dL (ref 8.7–10.2)
Chloride: 106 mmol/L (ref 96–106)
Creatinine, Ser: 0.89 mg/dL (ref 0.57–1.00)
GFR calc Af Amer: 87 mL/min/{1.73_m2} (ref >59)
GFR calc non Af Amer: 76 mL/min/{1.73_m2} (ref >59)
Globulin, Total: 2.5 g/dL (ref 1.5–4.5)
Glucose: 103 mg/dL — ABNORMAL HIGH (ref 65–99)
Potassium: 4.2 mmol/L (ref 3.5–5.2)
Sodium: 141 mmol/L (ref 134–144)
Total Protein: 7.1 g/dL (ref 6.0–8.5)

## 2020-07-15 LAB — CARDIOVASCULAR RISK ASSESSMENT

## 2020-07-15 LAB — VITAMIN D 25 HYDROXY (VIT D DEFICIENCY, FRACTURES): Vit D, 25-Hydroxy: 30 ng/mL (ref 30.0–100.0)

## 2020-08-20 ENCOUNTER — Other Ambulatory Visit: Payer: Self-pay | Admitting: Physician Assistant

## 2020-08-23 ENCOUNTER — Other Ambulatory Visit: Payer: Self-pay

## 2020-08-23 DIAGNOSIS — I10 Essential (primary) hypertension: Secondary | ICD-10-CM

## 2020-08-23 MED ORDER — LISINOPRIL 10 MG PO TABS: 10.0000 mg | ORAL_TABLET | Freq: Every day | ORAL | 0 refills | Status: DC

## 2020-09-14 ENCOUNTER — Telehealth (INDEPENDENT_AMBULATORY_CARE_PROVIDER_SITE_OTHER): Payer: BC Managed Care – PPO | Admitting: Physician Assistant

## 2020-09-14 ENCOUNTER — Encounter: Payer: Self-pay | Admitting: Physician Assistant

## 2020-09-14 VITALS — BP 149/93 | Temp 97.9°F | Ht 63.0 in | Wt 172.0 lb

## 2020-09-14 DIAGNOSIS — J060 Acute laryngopharyngitis: Secondary | ICD-10-CM | POA: Diagnosis not present

## 2020-09-14 MED ORDER — ALBUTEROL SULFATE HFA 108 (90 BASE) MCG/ACT IN AERS: 2.0000 | INHALATION_SPRAY | Freq: Four times a day (QID) | RESPIRATORY_TRACT | 0 refills | Status: DC | PRN

## 2020-09-14 MED ORDER — AZITHROMYCIN 250 MG PO TABS: ORAL_TABLET | ORAL | 0 refills | Status: DC

## 2020-09-14 NOTE — Progress Notes (Signed)
Virtual Visit via Telephone Note   This visit type was conducted due to national recommendations for restrictions regarding the COVID-19 Pandemic (e.g. social distancing) in an effort to limit this patient's exposure and mitigate transmission in our community.  Due to her co-morbid illnesses, this patient is at least at moderate risk for complications without adequate follow up.  This format is felt to be most appropriate for this patient at this time.  The patient did not have access to video technology/had technical difficulties with video requiring transitioning to audio format only (telephone).  All issues noted in this document were discussed and addressed.  No physical exam could be performed with this format.  Patient verbally consented to a telehealth visit.   Date:  09/14/2020   ID:  Bonnie Parker, DOB [birth year], MRN [id number]  Patient Location: Home Provider Location: Office  PCP:  Marge Duncans, PA-C    Chief Complaint:  Uri/sinusitis  History of Present Illness:    Bonnie Parker is a 49 y.o. female with uri/sinusitis symptoms - she states she has had sinus pressure, pnd and cough for over a week- does not think she has had a fever - has had some malaise - states she may have mild wheezing and does have a history of asthma - is not really having shortness of breath   The patient does have symptoms concerning for COVID-19 infection (fever, chills, cough, or new shortness of breath).    Past Medical History:  Diagnosis Date  . Age-related osteoporosis without current pathological fracture   . Anxiety   . Arthritis   . Asthma   . Atrophy of thyroid (acquired)   . Depression   . Drug-induced myopathy   . Gastro-esophageal reflux disease without esophagitis   . Hypothyroidism   . Lumbar spinal stenosis   . Mixed hyperlipidemia   . Polyarthralgia   . Sarcoidosis of other sites    Past Surgical History:  Procedure Laterality Date  . ABDOMINAL HYSTERECTOMY  1999    . BACK SURGERY  2005   Dr. Ronnald Ramp  . INNER EAR SURGERY    . LUMBAR LAMINECTOMY/DECOMPRESSION MICRODISCECTOMY N/A 06/21/2017   Procedure: Lumbar decompression L3-4 ;  Surgeon: Melina Schools, MD;  Location: [place];  Service: Orthopedics;  Laterality: N/A;  3 hrs  . SPINAL CORD STIMULATOR INSERTION N/A 03/13/2019   Procedure: LUMBAR SPINAL CORD STIMULATOR INSERTION;  Surgeon: Melina Schools, MD;  Location: [place];  Service: Orthopedics;  Laterality: N/A;  . TONSILLECTOMY       Current Meds  Medication Sig  . acetaminophen (TYLENOL) 500 MG tablet Take 1,000 mg by mouth every 6 (six) hours as needed for moderate pain or headache.  . alendronate (FOSAMAX) 70 MG tablet Take 1 tablet (70 mg total) by mouth every Tuesday. Take with a full glass of water on an empty stomach.  Marland Kitchen buPROPion (WELLBUTRIN XL) 300 MG 24 hr tablet Take 1 tablet (300 mg total) by mouth daily.  . clotrimazole-betamethasone (LOTRISONE) cream Apply 1 application topically 2 (two) times daily.  Marland Kitchen ezetimibe (ZETIA) 10 MG tablet Take 10 mg by mouth daily.  . folic acid (FOLVITE) 1 MG tablet Take 1 mg by mouth daily.  Marland Kitchen HYDROcodone-acetaminophen (NORCO) 10-325 MG tablet Take 1 tablet by mouth every 6 (six) hours as needed for moderate pain.  Marland Kitchen ibuprofen (ADVIL) 200 MG tablet Take 200 mg by mouth every 6 (six) hours as needed.  Marland Kitchen icosapent Ethyl (VASCEPA) 1 g capsule Take 2 capsules (  2 g total) by mouth 2 (two) times daily.  Marland Kitchen levothyroxine (SYNTHROID) 50 MCG tablet Take 1 tablet (50 mcg total) by mouth daily before breakfast.  . lisinopril (ZESTRIL) 10 MG tablet Take 1 tablet (10 mg total) by mouth daily.  . methocarbamol (ROBAXIN) 750 MG tablet Take 1,500 mg by mouth 3 (three) times daily.  . Methotrexate Sodium (METHOTREXATE, PF,) 50 MG/2ML injection   . pantoprazole (PROTONIX) 40 MG tablet Take 1 tablet (40 mg total) by mouth every evening.  . sertraline (ZOLOFT) 100 MG tablet TAKE 1 TABLET BY MOUTH  DAILY  . TUBERCULIN SYR  1CC/27GX1/2" 27G X 1/2" 1 ML MISC 0.5 mLs by Misc.(Non-Drug; Combo Route) route every 7 days. See methotrexate rx for directions.  . Vitamin D, Ergocalciferol, (DRISDOL) 1.25 MG (50000 UNIT) CAPS capsule TAKE 1 CAPSULE BY MOUTH TWICE A WEEK  . zolpidem (AMBIEN) 10 MG tablet TAKE 1 TABLET BY MOUTH AT  BEDTIME AS NEEDED     Allergies:   Other, Tape, Crestor [rosuvastatin], and Lipitor [atorvastatin]   Social History   Tobacco Use  . Smoking status: Never Smoker  . Smokeless tobacco: Never Used  Vaping Use  . Vaping Use: Never used  Substance Use Topics  . Alcohol use: No  . Drug use: No     Family Hx: The patient's family history includes Cancer - Lung in her father; Hypertension in her mother.  ROS:   Please see the history of present illness.    All other systems reviewed and are negative.  Labs/Other Tests and Data Reviewed:    Recent Labs: 07/14/2020: ALT 17; BUN 9; Creatinine, Ser 0.89; Hemoglobin 12.8; Platelets 345; Potassium 4.2; Sodium 141; TSH 0.971   Recent Lipid Panel Lab Results  Component Value Date/Time   CHOL 219 (H) 07/14/2020 08:46 AM   TRIG 293 (H) 07/14/2020 08:46 AM   HDL 39 (L) 07/14/2020 08:46 AM   CHOLHDL 5.6 (H) 07/14/2020 08:46 AM   LDLCALC 128 (H) 07/14/2020 08:46 AM    Wt Readings from Last 3 Encounters:  09/14/20 172 lb (78 kg)  07/14/20 176 lb (79.8 kg)  04/06/20 177 lb (80.3 kg)     Objective:    Vital Signs:  BP (!) 149/93   Temp 97.9 F (36.6 C)   Ht 5\' 3"  (1.6 m)   Wt 172 lb (78 kg)   LMP 10/09/1997 (Within Months) Comment: hysterectomy 1999  BMI 30.47 kg/m      ASSESSMENT & PLAN:    1. URI - pt will come in for COVID test and quarantine until results back - continue otc decongestants and rx for zpack and albuterol sent to pharmacy   COVID-19 Education: The signs and symptoms of COVID-19 were discussed with the patient and how to seek care for testing (follow up with PCP or arrange E-visit). The importance of social  distancing was discussed today.  Time:   Today, I have spent 10 minutes with the patient with telehealth technology discussing the above problems.   Philipp Ovens CMA interviewed pt and obtained vital signs Medication Adjustments/Labs and Tests Ordered: Current medicines are reviewed at length with the patient today.  Concerns regarding medicines are outlined above.   Tests Ordered: Orders Placed This Encounter  Procedures  . Novel Coronavirus, NAA ([facility])    Medication Changes: Meds ordered this encounter  Medications  . albuterol (VENTOLIN HFA) 108 (90 Base) MCG/ACT inhaler    Sig: Inhale 2 puffs into the lungs every 6 (six) hours as  needed for wheezing or shortness of breath.    Dispense:  8 g    Refill:  0    Order Specific Question:   Supervising Provider    AnswerRochel Brome [id number]  . azithromycin (ZITHROMAX) 250 MG tablet    Sig: 2 po day one then 1 po days 2-5    Dispense:  6 each    Refill:  0    Order Specific Question:   Supervising Provider    AnswerShelton Silvas    Follow Up:  Virtual Visit  prn  Signed, Webb Silversmith, PA-C  09/14/2020 10:23 AM    [place]

## 2020-09-15 LAB — NOVEL CORONAVIRUS, NAA: SARS-CoV-2, NAA: NOT DETECTED

## 2020-09-15 LAB — SARS-COV-2, NAA 2 DAY TAT

## 2020-09-27 ENCOUNTER — Other Ambulatory Visit: Payer: Self-pay | Admitting: Physician Assistant

## 2020-09-27 ENCOUNTER — Telehealth: Payer: Self-pay

## 2020-09-27 MED ORDER — BENZONATATE 100 MG PO CAPS: 100.0000 mg | ORAL_CAPSULE | Freq: Three times a day (TID) | ORAL | 0 refills | Status: DC | PRN

## 2020-09-27 NOTE — Telephone Encounter (Signed)
Spoke with pt she would like for you to send tessalon to [facility] in [place].

## 2020-10-17 ENCOUNTER — Other Ambulatory Visit: Payer: Self-pay | Admitting: Physician Assistant

## 2020-10-18 ENCOUNTER — Ambulatory Visit (INDEPENDENT_AMBULATORY_CARE_PROVIDER_SITE_OTHER): Payer: BC Managed Care – PPO | Admitting: Physician Assistant

## 2020-10-18 ENCOUNTER — Other Ambulatory Visit: Payer: Self-pay

## 2020-10-18 ENCOUNTER — Encounter: Payer: Self-pay | Admitting: Physician Assistant

## 2020-10-18 VITALS — BP 132/80 | HR 77 | Temp 97.3°F | Ht 62.0 in | Wt 172.6 lb

## 2020-10-18 DIAGNOSIS — J40 Bronchitis, not specified as acute or chronic: Secondary | ICD-10-CM

## 2020-10-18 LAB — POC COVID19 BINAXNOW: SARS Coronavirus 2 Ag: NEGATIVE

## 2020-10-18 MED ORDER — CEFDINIR 300 MG PO CAPS: 300.0000 mg | ORAL_CAPSULE | Freq: Two times a day (BID) | ORAL | 0 refills | Status: DC

## 2020-10-18 MED ORDER — TRIAMCINOLONE ACETONIDE 40 MG/ML IJ SUSP
60.0000 mg | Freq: Once | INTRAMUSCULAR | Status: AC
  Administered 2020-10-18: 60 mg via INTRAMUSCULAR

## 2020-10-18 NOTE — Progress Notes (Signed)
Acute Office Visit  Subjective:    Patient ID: Bonnie Parker, female    DOB: November 19, 1970, 50 y.o.   MRN: [id number]  Chief Complaint  Patient presents with  . Cough    HPI Patient is in today for cough, congestion, wheezing Pt states that she has had symptoms since early December - was tested for COVID at that time and was negative - got some better then symptoms recurred Today states she had a low grade temp yesterday and continues to have cough and congestion mostly in evenings and early am  Past Medical History:  Diagnosis Date  . Age-related osteoporosis without current pathological fracture   . Anxiety   . Arthritis   . Asthma   . Atrophy of thyroid (acquired)   . Depression   . Drug-induced myopathy   . Gastro-esophageal reflux disease without esophagitis   . Hypothyroidism   . Lumbar spinal stenosis   . Mixed hyperlipidemia   . Polyarthralgia   . Sarcoidosis of other sites     Past Surgical History:  Procedure Laterality Date  . ABDOMINAL HYSTERECTOMY  1999  . BACK SURGERY  2005   Dr. Ronnald Ramp  . INNER EAR SURGERY    . LUMBAR LAMINECTOMY/DECOMPRESSION MICRODISCECTOMY N/A 06/21/2017   Procedure: Lumbar decompression L3-4 ;  Surgeon: Melina Schools, MD;  Location: [place];  Service: Orthopedics;  Laterality: N/A;  3 hrs  . SPINAL CORD STIMULATOR INSERTION N/A 03/13/2019   Procedure: LUMBAR SPINAL CORD STIMULATOR INSERTION;  Surgeon: Melina Schools, MD;  Location: [place];  Service: Orthopedics;  Laterality: N/A;  . TONSILLECTOMY      Family History  Problem Relation Age of Onset  . Hypertension Mother   . Cancer - Lung Father     Social History   Socioeconomic History  . Marital status: Married    Spouse name: Not on file  . Number of children: 4  . Years of education: Not on file  . Highest education level: Not on file  Occupational History  . Not on file  Tobacco Use  . Smoking status: Never Smoker  . Smokeless tobacco: Never Used  Vaping Use  .  Vaping Use: Never used  Substance and Sexual Activity  . Alcohol use: No  . Drug use: No  . Sexual activity: Not on file  Other Topics Concern  . Not on file  Social History Narrative  . Not on file   Social Determinants of Health   Financial Resource Strain: Not on file  Food Insecurity: Not on file  Transportation Needs: Not on file  Physical Activity: Not on file  Stress: Not on file  Social Connections: Not on file  Intimate Partner Violence: Not on file    Outpatient Medications Prior to Visit  Medication Sig Dispense Refill  . acetaminophen (TYLENOL) 500 MG tablet Take 1,000 mg by mouth every 6 (six) hours as needed for moderate pain or headache.    . albuterol (VENTOLIN HFA) 108 (90 Base) MCG/ACT inhaler Inhale 2 puffs into the lungs every 6 (six) hours as needed for wheezing or shortness of breath. 8 g 0  . alendronate (FOSAMAX) 70 MG tablet Take 1 tablet (70 mg total) by mouth every Tuesday. Take with a full glass of water on an empty stomach. 12 tablet 3  . benzonatate (TESSALON) 100 MG capsule Take 1 capsule (100 mg total) by mouth 3 (three) times daily as needed for cough. 30 capsule 0  . buPROPion (WELLBUTRIN XL) 300 MG  24 hr tablet Take 1 tablet (300 mg total) by mouth daily. 90 tablet 1  . clotrimazole-betamethasone (LOTRISONE) cream Apply 1 application topically 2 (two) times daily. 30 g 0  . ezetimibe (ZETIA) 10 MG tablet Take 10 mg by mouth daily.    . fenofibrate 160 MG tablet Take 160 mg by mouth daily.    . folic acid (FOLVITE) 1 MG tablet Take 1 mg by mouth daily.    Marland Kitchen HYDROcodone-acetaminophen (NORCO) 10-325 MG tablet Take 1 tablet by mouth every 6 (six) hours as needed for moderate pain.    Marland Kitchen ibuprofen (ADVIL) 200 MG tablet Take 200 mg by mouth every 6 (six) hours as needed.    Marland Kitchen levothyroxine (SYNTHROID) 50 MCG tablet Take 1 tablet (50 mcg total) by mouth daily before breakfast. 90 tablet 1  . lisinopril (ZESTRIL) 10 MG tablet Take 1 tablet (10 mg total) by  mouth daily. 90 tablet 0  . methocarbamol (ROBAXIN) 750 MG tablet Take 1,500 mg by mouth 3 (three) times daily.    . Methotrexate Sodium (METHOTREXATE, PF,) 50 MG/2ML injection     . pantoprazole (PROTONIX) 40 MG tablet Take 1 tablet (40 mg total) by mouth every evening. 90 tablet 1  . sertraline (ZOLOFT) 100 MG tablet TAKE 1 TABLET BY MOUTH  DAILY 90 tablet 3  . TUBERCULIN SYR 1CC/27GX1/2" 27G X 1/2" 1 ML MISC 0.5 mLs by Misc.(Non-Drug; Combo Route) route every 7 days. See methotrexate rx for directions.    . Vitamin D, Ergocalciferol, (DRISDOL) 1.25 MG (50000 UNIT) CAPS capsule TAKE 1 CAPSULE BY MOUTH TWICE A WEEK 24 capsule 0  . zolpidem (AMBIEN) 10 MG tablet TAKE 1 TABLET BY MOUTH AT  BEDTIME AS NEEDED 30 tablet 0  . azithromycin (ZITHROMAX) 250 MG tablet 2 po day one then 1 po days 2-5 6 each 0  . icosapent Ethyl (VASCEPA) 1 g capsule Take 2 capsules (2 g total) by mouth 2 (two) times daily. 120 capsule 2   No facility-administered medications prior to visit.    Allergies  Allergen Reactions  . Other Itching    blisters  . Tape Other (See Comments) and Itching    blisters  . Crestor [Rosuvastatin] Other (See Comments)    MYALGIAS  . Lipitor [Atorvastatin] Other (See Comments)    MYALGIAS    Review of Systems CONSTITUTIONAL: Negative for chills, fatigue, fever, unintentional weight gain and unintentional weight loss.  E/N/T: see HPI CARDIOVASCULAR: Negative for chest pain, dizziness, palpitations and pedal edema.  RESPIRATORY: see HPI  GASTROINTESTINAL: Negative for abdominal pain, acid reflux symptoms, constipation, diarrhea, nausea and vomiting.  MSK: Negative for arthralgias and myalgias.  INTEGUMENTARY: Negative for rash.  PSYCHIATRIC: Negative for sleep disturbance and to question depression screen.  Negative for depression, negative for anhedonia.         Objective:    Physical Exam PHYSICAL EXAM:   VS: BP 132/80 (BP Location: Left Arm, Patient Position: Sitting,  Cuff Size: Large)   Pulse 77   Temp (!) 97.3 F (36.3 C) (Temporal)   Ht 5\' 2"  (1.575 m)   Wt 172 lb 9.6 oz (78.3 kg)   LMP 10/09/1997 (Within Months) Comment: hysterectomy 1999  SpO2 97%   BMI 31.57 kg/m   GEN: Well nourished, well developed, in no acute distress  HEENT: TMS normal Oropharynx - normal mucosa, palate, and posterior pharynx Cardiac: RRR; no murmurs, rubs, or gallops,no edema -  Respiratory:  normal respiratory rate and pattern with no distress -  has mild exp rhonchi noted Skin: warm and dry, no rash    BP 132/80 (BP Location: Left Arm, Patient Position: Sitting, Cuff Size: Large)   Pulse 77   Temp (!) 97.3 F (36.3 C) (Temporal)   Ht 5\' 2"  (1.575 m)   Wt 172 lb 9.6 oz (78.3 kg)   LMP 10/09/1997 (Within Months) Comment: hysterectomy 1999  SpO2 97%   BMI 31.57 kg/m  Wt Readings from Last 3 Encounters:  10/18/20 172 lb 9.6 oz (78.3 kg)  09/14/20 172 lb (78 kg)  07/14/20 176 lb (79.8 kg)    Health Maintenance Due  Topic Date Due  . PAP SMEAR-Modifier  Never done  . COLONOSCOPY (Pts 45-52yrs Insurance coverage will need to be confirmed)  Never done    There are no preventive care reminders to display for this patient.   Lab Results  Component Value Date   TSH 0.971 07/14/2020   Lab Results  Component Value Date   WBC 6.4 07/14/2020   HGB 12.8 07/14/2020   HCT 38.5 07/14/2020   MCV 91 07/14/2020   PLT 345 07/14/2020   Lab Results  Component Value Date   NA 141 07/14/2020   K 4.2 07/14/2020   CO2 19 (L) 07/14/2020   GLUCOSE 103 (H) 07/14/2020   BUN 9 07/14/2020   CREATININE 0.89 07/14/2020   BILITOT 0.3 07/14/2020   ALKPHOS 78 07/14/2020   AST 13 07/14/2020   ALT 17 07/14/2020   PROT 7.1 07/14/2020   ALBUMIN 4.6 07/14/2020   CALCIUM 8.9 07/14/2020   ANIONGAP 7 03/11/2019   Lab Results  Component Value Date   CHOL 219 (H) 07/14/2020   Lab Results  Component Value Date   HDL 39 (L) 07/14/2020   Lab Results  Component Value Date    LDLCALC 128 (H) 07/14/2020   Lab Results  Component Value Date   TRIG 293 (H) 07/14/2020   Lab Results  Component Value Date   CHOLHDL 5.6 (H) 07/14/2020   No results found for: HGBA1C     Assessment & Plan:  1. Bronchitis - POC COVID-19 BinaxNow - DG Chest 2 View - triamcinolone acetonide (KENALOG-40) injection 60 mg - cefdinir (OMNICEF) 300 MG capsule; Take 1 capsule (300 mg total) by mouth 2 (two) times daily.  Dispense: 20 capsule; Refill: 0    Meds ordered this encounter  Medications  . triamcinolone acetonide (KENALOG-40) injection 60 mg  . cefdinir (OMNICEF) 300 MG capsule    Sig: Take 1 capsule (300 mg total) by mouth 2 (two) times daily.    Dispense:  20 capsule    Refill:  0    Order Specific Question:   Supervising Provider    AnswerShelton Silvas    Orders Placed This Encounter  Procedures  . DG Chest 2 View  . POC COVID-19 BinaxNow       Follow-up: Return for schedule for fasting physical.  An After Visit Summary was printed and given to the patient.  Yetta Flock [facility] [phone]

## 2020-10-30 ENCOUNTER — Other Ambulatory Visit: Payer: Self-pay | Admitting: Family Medicine

## 2020-10-30 DIAGNOSIS — I10 Essential (primary) hypertension: Secondary | ICD-10-CM

## 2020-11-02 ENCOUNTER — Other Ambulatory Visit: Payer: Self-pay

## 2020-11-03 MED ORDER — FENOFIBRATE 160 MG PO TABS: 160.0000 mg | ORAL_TABLET | Freq: Every day | ORAL | 0 refills | Status: DC

## 2020-11-03 NOTE — Telephone Encounter (Signed)
Yours.  Looks like she is due for blood work.

## 2020-11-23 ENCOUNTER — Other Ambulatory Visit: Payer: Self-pay

## 2020-11-24 ENCOUNTER — Other Ambulatory Visit: Payer: Self-pay | Admitting: Physician Assistant

## 2020-11-24 ENCOUNTER — Other Ambulatory Visit: Payer: Self-pay

## 2020-11-24 ENCOUNTER — Encounter: Payer: Self-pay | Admitting: Physician Assistant

## 2020-11-24 ENCOUNTER — Ambulatory Visit: Payer: BC Managed Care – PPO | Admitting: Physician Assistant

## 2020-11-24 VITALS — BP 128/82 | HR 92 | Temp 97.9°F | Ht 62.0 in | Wt 170.2 lb

## 2020-11-24 DIAGNOSIS — T22221A Burn of second degree of right elbow, initial encounter: Secondary | ICD-10-CM

## 2020-11-24 DIAGNOSIS — J060 Acute laryngopharyngitis: Secondary | ICD-10-CM

## 2020-11-24 MED ORDER — SILVER SULFADIAZINE 1 % EX CREA: 1.0000 "application " | TOPICAL_CREAM | Freq: Every day | CUTANEOUS | 0 refills | Status: DC

## 2020-11-24 MED ORDER — ALBUTEROL SULFATE HFA 108 (90 BASE) MCG/ACT IN AERS: 2.0000 | INHALATION_SPRAY | Freq: Four times a day (QID) | RESPIRATORY_TRACT | 0 refills | Status: DC | PRN

## 2020-11-24 MED ORDER — CEPHALEXIN 500 MG PO CAPS: 500.0000 mg | ORAL_CAPSULE | Freq: Four times a day (QID) | ORAL | 0 refills | Status: DC
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
## 2020-12-28 ENCOUNTER — Other Ambulatory Visit: Payer: Self-pay

## 2020-12-28 ENCOUNTER — Encounter: Payer: Self-pay | Admitting: Physician Assistant

## 2020-12-28 ENCOUNTER — Ambulatory Visit: Payer: BC Managed Care – PPO | Admitting: Physician Assistant

## 2020-12-28 VITALS — BP 128/72 | HR 96 | Temp 97.5°F | Ht 62.0 in | Wt 171.0 lb

## 2020-12-28 DIAGNOSIS — D869 Sarcoidosis, unspecified: Secondary | ICD-10-CM | POA: Diagnosis not present

## 2020-12-28 DIAGNOSIS — M792 Neuralgia and neuritis, unspecified: Secondary | ICD-10-CM

## 2020-12-28 MED ORDER — PREDNISONE 20 MG PO TABS: ORAL_TABLET | ORAL | 0 refills | Status: AC

## 2020-12-28 NOTE — Progress Notes (Signed)
Acute Office Visit  Subjective:    Patient ID: Bonnie Parker, female    DOB: 1971-03-17, 50 y.o.   MRN: [id number]  Chief Complaint  Patient presents with  . Back Pain    HPI Patient is in today for back pain - pt states that about 4 weeks ago she had 4-5 bumps on her back that were itchy and then went away - about a week after that she began having some discomfort on her left mid back area that was tender to touch and uncomfortable when shirt touches her back She has not had any further rash Pt does have history of chronic back pain and has had prior surgery in that area No rash today  Pt has orders from [facility] [place] for labwork  Past Medical History:  Diagnosis Date  . Age-related osteoporosis without current pathological fracture   . Anxiety   . Arthritis   . Asthma   . Atrophy of thyroid (acquired)   . Depression   . Drug-induced myopathy   . Gastro-esophageal reflux disease without esophagitis   . Hypothyroidism   . Lumbar spinal stenosis   . Mixed hyperlipidemia   . Polyarthralgia   . Sarcoidosis of other sites     Past Surgical History:  Procedure Laterality Date  . ABDOMINAL HYSTERECTOMY  1999  . BACK SURGERY  2005   Dr. Ronnald Ramp  . INNER EAR SURGERY    . LUMBAR LAMINECTOMY/DECOMPRESSION MICRODISCECTOMY N/A 06/21/2017   Procedure: Lumbar decompression L3-4 ;  Surgeon: Melina Schools, MD;  Location: [facility];  Service: Orthopedics;  Laterality: N/A;  3 hrs  . SPINAL CORD STIMULATOR INSERTION N/A 03/13/2019   Procedure: LUMBAR SPINAL CORD STIMULATOR INSERTION;  Surgeon: Melina Schools, MD;  Location: [place];  Service: Orthopedics;  Laterality: N/A;  . TONSILLECTOMY      Family History  Problem Relation Age of Onset  . Hypertension Mother   . Cancer - Lung Father     Social History   Socioeconomic History  . Marital status: Married    Spouse name: Not on file  . Number of children: 4  . Years of education: Not on file  . Highest education  level: Not on file  Occupational History  . Not on file  Tobacco Use  . Smoking status: Never Smoker  . Smokeless tobacco: Never Used  Vaping Use  . Vaping Use: Never used  Substance and Sexual Activity  . Alcohol use: No  . Drug use: No  . Sexual activity: Not on file  Other Topics Concern  . Not on file  Social History Narrative  . Not on file   Social Determinants of Health   Financial Resource Strain: Not on file  Food Insecurity: Not on file  Transportation Needs: Not on file  Physical Activity: Not on file  Stress: Not on file  Social Connections: Not on file  Intimate Partner Violence: Not on file    Outpatient Medications Prior to Visit  Medication Sig Dispense Refill  . acetaminophen (TYLENOL) 500 MG tablet Take 1,000 mg by mouth every 6 (six) hours as needed for moderate pain or headache.    . albuterol (VENTOLIN HFA) 108 (90 Base) MCG/ACT inhaler Inhale 2 puffs into the lungs every 6 (six) hours as needed for wheezing or shortness of breath. 8 g 0  . alendronate (FOSAMAX) 70 MG tablet Take 1 tablet (70 mg total) by mouth every Tuesday. Take with a full glass of water on an empty  stomach. 12 tablet 3  . buPROPion (WELLBUTRIN XL) 300 MG 24 hr tablet TAKE 1 TABLET BY MOUTH  DAILY 90 tablet 3  . clotrimazole-betamethasone (LOTRISONE) cream Apply 1 application topically 2 (two) times daily. 30 g 0  . ezetimibe (ZETIA) 10 MG tablet Take 10 mg by mouth daily.    . fenofibrate 160 MG tablet Take 1 tablet (160 mg total) by mouth daily. 90 tablet 0  . folic acid (FOLVITE) 1 MG tablet Take 1 mg by mouth daily.    Marland Kitchen HYDROcodone-acetaminophen (NORCO) 10-325 MG tablet Take 1 tablet by mouth every 6 (six) hours as needed for moderate pain.    Marland Kitchen ibuprofen (ADVIL) 200 MG tablet Take 200 mg by mouth every 6 (six) hours as needed.    Marland Kitchen levothyroxine (SYNTHROID) 50 MCG tablet TAKE 1 TABLET BY MOUTH  DAILY BEFORE BREAKFAST 90 tablet 3  . lisinopril (ZESTRIL) 10 MG tablet TAKE 1 TABLET  BY MOUTH  DAILY 90 tablet 3  . methocarbamol (ROBAXIN) 750 MG tablet Take 1,500 mg by mouth 3 (three) times daily.    . Methotrexate Sodium (METHOTREXATE, PF,) 50 MG/2ML injection     . pantoprazole (PROTONIX) 40 MG tablet TAKE 1 TABLET BY MOUTH IN  THE EVENING 90 tablet 3  . sertraline (ZOLOFT) 100 MG tablet TAKE 1 TABLET BY MOUTH  DAILY 90 tablet 3  . silver sulfADIAZINE (SILVADENE) 1 % cream Apply 1 application topically daily. 50 g 0  . TUBERCULIN SYR 1CC/27GX1/2" 27G X 1/2" 1 ML MISC 0.5 mLs by Misc.(Non-Drug; Combo Route) route every 7 days. See methotrexate rx for directions.    . Vitamin D, Ergocalciferol, (DRISDOL) 1.25 MG (50000 UNIT) CAPS capsule TAKE 1 CAPSULE BY MOUTH TWICE A WEEK 24 capsule 0  . zolpidem (AMBIEN) 10 MG tablet TAKE 1 TABLET BY MOUTH AT  BEDTIME AS NEEDED 30 tablet 0  . benzonatate (TESSALON) 100 MG capsule Take 1 capsule (100 mg total) by mouth 3 (three) times daily as needed for cough. 30 capsule 0  . cephALEXin (KEFLEX) 500 MG capsule Take 1 capsule (500 mg total) by mouth 4 (four) times daily. 20 capsule 0   No facility-administered medications prior to visit.    Allergies  Allergen Reactions  . Other Itching    blisters  . Tape Other (See Comments) and Itching    blisters  . Crestor [Rosuvastatin] Other (See Comments)    MYALGIAS  . Lipitor [Atorvastatin] Other (See Comments)    MYALGIAS    Review of Systems CONSTITUTIONAL: Negative for chills, fatigue, fever, unintentional weight gain and unintentional weight loss.  CARDIOVASCULAR: Negative for chest pain, dizziness, palpitations and pedal edema.  RESPIRATORY: Negative for recent cough and dyspnea.  INTEGUMENTARY: see HPI        Objective:    Physical Exam PHYSICAL EXAM:   VS: BP 128/72 (BP Location: Right Arm, Patient Position: Sitting, Cuff Size: Normal)   Pulse 96   Temp (!) 97.5 F (36.4 C) (Temporal)   Ht 5\' 2"  (1.575 m)   Wt 171 lb (77.6 kg)   LMP 10/09/1997 (Within Months)  Comment: hysterectomy 1999  SpO2 97%   BMI 31.28 kg/m   GEN: Well nourished, well developed, in no acute distress  Cardiac: RRR; no murmurs, rubs, or gallops,no edema -Respiratory:  normal respiratory rate and pattern with no distress - normal breath sounds with no rales, rhonchi, wheezes or rubs MS: no deformity or atrophy - tenderness to left mid back area to palpation Skin:  warm and dry, no rash    BP 128/72 (BP Location: Right Arm, Patient Position: Sitting, Cuff Size: Normal)   Pulse 96   Temp (!) 97.5 F (36.4 C) (Temporal)   Ht 5\' 2"  (1.575 m)   Wt 171 lb (77.6 kg)   LMP 10/09/1997 (Within Months) Comment: hysterectomy 1999  SpO2 97%   BMI 31.28 kg/m  Wt Readings from Last 3 Encounters:  12/28/20 171 lb (77.6 kg)  11/24/20 170 lb 3.2 oz (77.2 kg)  10/18/20 172 lb 9.6 oz (78.3 kg)    Health Maintenance Due  Topic Date Due  . PAP SMEAR-Modifier  Never done  . COLONOSCOPY (Pts 45-48yrs Insurance coverage will need to be confirmed)  Never done  . COVID-19 Vaccine (4 - Booster for Moderna series) 11/27/2020    There are no preventive care reminders to display for this patient.   Lab Results  Component Value Date   TSH 0.971 07/14/2020   Lab Results  Component Value Date   WBC 6.4 07/14/2020   HGB 12.8 07/14/2020   HCT 38.5 07/14/2020   MCV 91 07/14/2020   PLT 345 07/14/2020   Lab Results  Component Value Date   NA 141 07/14/2020   K 4.2 07/14/2020   CO2 19 (L) 07/14/2020   GLUCOSE 103 (H) 07/14/2020   BUN 9 07/14/2020   CREATININE 0.89 07/14/2020   BILITOT 0.3 07/14/2020   ALKPHOS 78 07/14/2020   AST 13 07/14/2020   ALT 17 07/14/2020   PROT 7.1 07/14/2020   ALBUMIN 4.6 07/14/2020   CALCIUM 8.9 07/14/2020   ANIONGAP 7 03/11/2019   Lab Results  Component Value Date   CHOL 219 (H) 07/14/2020   Lab Results  Component Value Date   HDL 39 (L) 07/14/2020   Lab Results  Component Value Date   LDLCALC 128 (H) 07/14/2020   Lab Results  Component  Value Date   TRIG 293 (H) 07/14/2020   Lab Results  Component Value Date   CHOLHDL 5.6 (H) 07/14/2020   No results found for: HGBA1C     Assessment & Plan:  1. Neuralgia  2. Sarcoidosis - CBC with Differential/Platelet - Hepatic Function Panel - Creatinine with Est GFR    No orders of the defined types were placed in this encounter.   Orders Placed This Encounter  Procedures  . CBC with Differential/Platelet  . Hepatic Function Panel  . Creatinine with Est GFR      Follow-up: Return if symptoms worsen or fail to improve.  An After Visit Summary was printed and given to the patient.  Yetta Flock [facility] [phone]

## 2020-12-29 LAB — CREATININE WITH EST GFR
Creatinine, Ser: 1.05 mg/dL — ABNORMAL HIGH (ref 0.57–1.00)
eGFR: 64 mL/min/{1.73_m2} (ref >59)

## 2020-12-29 LAB — CBC WITH DIFFERENTIAL/PLATELET
Basophils Absolute: 0.1 10*3/uL (ref 0.0–0.2)
Basos: 1 %
EOS (ABSOLUTE): 0.4 10*3/uL (ref 0.0–0.4)
Eos: 5 %
Hematocrit: 37.8 % (ref 34.0–46.6)
Hemoglobin: 12.4 g/dL (ref 11.1–15.9)
Immature Grans (Abs): 0 10*3/uL (ref 0.0–0.1)
Immature Granulocytes: 0 %
Lymphocytes Absolute: 1.7 10*3/uL (ref 0.7–3.1)
Lymphs: 22 %
MCH: 30 pg (ref 26.6–33.0)
MCHC: 32.8 g/dL (ref 31.5–35.7)
MCV: 92 fL (ref 79–97)
Monocytes Absolute: 0.8 10*3/uL (ref 0.1–0.9)
Monocytes: 10 %
Neutrophils Absolute: 4.7 10*3/uL (ref 1.4–7.0)
Neutrophils: 62 %
Platelets: 398 10*3/uL (ref 150–450)
RBC: 4.13 x10E6/uL (ref 3.77–5.28)
RDW: 14.3 % (ref 11.7–15.4)
WBC: 7.6 10*3/uL (ref 3.4–10.8)

## 2020-12-29 LAB — HEPATIC FUNCTION PANEL
ALT: 19 IU/L (ref 0–32)
AST: 14 IU/L (ref 0–40)
Albumin: 4.9 g/dL (ref 3.8–4.9)
Alkaline Phosphatase: 54 IU/L (ref 44–121)
Bilirubin Total: 0.2 mg/dL (ref 0.0–1.2)
Bilirubin, Direct: 0.11 mg/dL (ref 0.00–0.40)
Total Protein: 7.4 g/dL (ref 6.0–8.5)

## 2020-12-30 ENCOUNTER — Other Ambulatory Visit: Payer: Self-pay | Admitting: Physician Assistant

## 2020-12-30 ENCOUNTER — Other Ambulatory Visit: Payer: Self-pay

## 2020-12-30 DIAGNOSIS — M5136 Other intervertebral disc degeneration, lumbar region: Secondary | ICD-10-CM

## 2020-12-30 MED ORDER — ALENDRONATE SODIUM 70 MG PO TABS: 70.0000 mg | ORAL_TABLET | ORAL | 3 refills | Status: DC

## 2020-12-30 MED ORDER — ALENDRONATE SODIUM 70 MG PO TABS
70.0000 mg | ORAL_TABLET | ORAL | 3 refills | Status: DC
Start: 2021-01-04 — End: 2021-11-10

## 2021-01-25 ENCOUNTER — Other Ambulatory Visit: Payer: Self-pay | Admitting: Physician Assistant

## 2021-01-25 DIAGNOSIS — J060 Acute laryngopharyngitis: Secondary | ICD-10-CM

## 2021-02-20 ENCOUNTER — Other Ambulatory Visit: Payer: Self-pay | Admitting: Physician Assistant

## 2021-03-01 ENCOUNTER — Telehealth (INDEPENDENT_AMBULATORY_CARE_PROVIDER_SITE_OTHER): Payer: BC Managed Care – PPO | Admitting: Physician Assistant

## 2021-03-01 ENCOUNTER — Encounter: Payer: Self-pay | Admitting: Physician Assistant

## 2021-03-01 VITALS — BP 123/81 | HR 106 | Ht 62.0 in | Wt 166.0 lb

## 2021-03-01 DIAGNOSIS — U071 COVID-19: Secondary | ICD-10-CM | POA: Diagnosis not present

## 2021-03-01 MED ORDER — BENZONATATE 100 MG PO CAPS: 100.0000 mg | ORAL_CAPSULE | Freq: Two times a day (BID) | ORAL | 1 refills | Status: DC | PRN

## 2021-03-01 MED ORDER — MOLNUPIRAVIR EUA 200MG CAPSULE: 4.0000 | ORAL_CAPSULE | Freq: Two times a day (BID) | ORAL | 0 refills | Status: AC

## 2021-03-01 NOTE — Progress Notes (Signed)
Virtual Visit via Telephone Note   This visit type was conducted due to national recommendations for restrictions regarding the COVID-19 Pandemic (e.g. social distancing) in an effort to limit this patient's exposure and mitigate transmission in our community.  Due to her co-morbid illnesses, this patient is at least at moderate risk for complications without adequate follow up.  This format is felt to be most appropriate for this patient at this time.  The patient did not have access to video technology/had technical difficulties with video requiring transitioning to audio format only (telephone).  All issues noted in this document were discussed and addressed.  No physical exam could be performed with this format.  Patient verbally consented to a telehealth visit.   Date:  03/01/2021   ID:  Bonnie Parker, DOB 1971/08/26, MRN [id number]  Patient Location: Home Provider Location: Office  PCP:  Marge Duncans, PA-C    Chief Complaint:  COVID 19  History of Present Illness:    Bonnie Parker is a 50 y.o. female with positive home COVID test today - complains of mild sore throat , congestion and cough She denies fever but has had some malaise Husband with same symptoms and treated recently for COVID as well The patient does have symptoms concerning for COVID-19 infection (fever, chills, cough, or new shortness of breath).    Past Medical History:  Diagnosis Date  . Age-related osteoporosis without current pathological fracture   . Anxiety   . Arthritis   . Asthma   . Atrophy of thyroid (acquired)   . Depression   . Drug-induced myopathy   . Gastro-esophageal reflux disease without esophagitis   . Hypothyroidism   . Lumbar spinal stenosis   . Mixed hyperlipidemia   . Polyarthralgia   . Sarcoidosis of other sites    Past Surgical History:  Procedure Laterality Date  . ABDOMINAL HYSTERECTOMY  1999  . BACK SURGERY  2005   Dr. Ronnald Ramp  . INNER EAR SURGERY    . LUMBAR  LAMINECTOMY/DECOMPRESSION MICRODISCECTOMY N/A 06/21/2017   Procedure: Lumbar decompression L3-4 ;  Surgeon: Melina Schools, MD;  Location: [place];  Service: Orthopedics;  Laterality: N/A;  3 hrs  . SPINAL CORD STIMULATOR INSERTION N/A 03/13/2019   Procedure: LUMBAR SPINAL CORD STIMULATOR INSERTION;  Surgeon: Melina Schools, MD;  Location: [place];  Service: Orthopedics;  Laterality: N/A;  . TONSILLECTOMY       Current Meds  Medication Sig  . acetaminophen (TYLENOL) 500 MG tablet Take 1,000 mg by mouth every 6 (six) hours as needed for moderate pain or headache.  . albuterol (VENTOLIN HFA) 108 (90 Base) MCG/ACT inhaler INHALE 2 PUFFS BY MOUTH EVERY 6 HOURS AS NEEDED FOR WHEEZING FOR SHORTNESS OF BREATH  . alendronate (FOSAMAX) 70 MG tablet Take 1 tablet (70 mg total) by mouth every Tuesday. Take with a full glass of water on an empty stomach.  Marland Kitchen buPROPion (WELLBUTRIN XL) 300 MG 24 hr tablet TAKE 1 TABLET BY MOUTH  DAILY  . clotrimazole-betamethasone (LOTRISONE) cream Apply 1 application topically 2 (two) times daily.  Marland Kitchen ezetimibe (ZETIA) 10 MG tablet Take 10 mg by mouth daily.  . fenofibrate 160 MG tablet Take 1 tablet by mouth once daily  . folic acid (FOLVITE) 1 MG tablet Take 1 mg by mouth daily.  Marland Kitchen HYDROcodone-acetaminophen (NORCO) 10-325 MG tablet Take 1 tablet by mouth every 6 (six) hours as needed for moderate pain.  Marland Kitchen ibuprofen (ADVIL) 200 MG tablet Take 200 mg by mouth  every 6 (six) hours as needed.  Marland Kitchen levothyroxine (SYNTHROID) 50 MCG tablet TAKE 1 TABLET BY MOUTH  DAILY BEFORE BREAKFAST  . lisinopril (ZESTRIL) 10 MG tablet TAKE 1 TABLET BY MOUTH  DAILY  . methocarbamol (ROBAXIN) 750 MG tablet Take 1,500 mg by mouth 3 (three) times daily.  . Methotrexate Sodium (METHOTREXATE, PF,) 50 MG/2ML injection   . pantoprazole (PROTONIX) 40 MG tablet TAKE 1 TABLET BY MOUTH IN  THE EVENING  . sertraline (ZOLOFT) 100 MG tablet TAKE 1 TABLET BY MOUTH  DAILY  . silver sulfADIAZINE (SILVADENE) 1 % cream  Apply 1 application topically daily.  . TUBERCULIN SYR 1CC/27GX1/2" 27G X 1/2" 1 ML MISC 0.5 mLs by Misc.(Non-Drug; Combo Route) route every 7 days. See methotrexate rx for directions.  . Vitamin D, Ergocalciferol, (DRISDOL) 1.25 MG (50000 UNIT) CAPS capsule TAKE 1 CAPSULE BY MOUTH TWICE A WEEK  . zolpidem (AMBIEN) 10 MG tablet TAKE 1 TABLET BY MOUTH AT  BEDTIME AS NEEDED     Allergies:   Other, Tape, Crestor [rosuvastatin], and Lipitor [atorvastatin]   Social History   Tobacco Use  . Smoking status: Never Smoker  . Smokeless tobacco: Never Used  Vaping Use  . Vaping Use: Never used  Substance Use Topics  . Alcohol use: No  . Drug use: No     Family Hx: The patient's family history includes Cancer - Lung in her father; Hypertension in her mother.  ROS:   Please see the history of present illness.    All other systems reviewed and are negative.  Labs/Other Tests and Data Reviewed:    Recent Labs: 07/14/2020: BUN 9; Potassium 4.2; Sodium 141; TSH 0.971 12/28/2020: ALT 19; Creatinine, Ser 1.05; Hemoglobin 12.4; Platelets 398   Recent Lipid Panel Lab Results  Component Value Date/Time   CHOL 219 (H) 07/14/2020 08:46 AM   TRIG 293 (H) 07/14/2020 08:46 AM   HDL 39 (L) 07/14/2020 08:46 AM   CHOLHDL 5.6 (H) 07/14/2020 08:46 AM   LDLCALC 128 (H) 07/14/2020 08:46 AM    Wt Readings from Last 3 Encounters:  03/01/21 166 lb (75.3 kg)  12/28/20 171 lb (77.6 kg)  11/24/20 170 lb 3.2 oz (77.2 kg)     Objective:    Vital Signs:  BP 123/81   Pulse (!) 106   Ht 5\' 2"  (1.575 m)   Wt 166 lb (75.3 kg)   LMP 10/09/1997 (Within Months) Comment: hysterectomy 1999  SpO2 96%   BMI 30.36 kg/m    VITAL SIGNS:  reviewed  ASSESSMENT & PLAN:    1. COVID 19 - recommend rest, fluids - rx for tessalon and for molnupiravir - quarantine according to [employer] guidelines  COVID-19 Education: The signs and symptoms of COVID-19 were discussed with the patient and how to seek care for testing  (follow up with PCP or arrange E-visit). The importance of social distancing was discussed today.  Time:   Today, I have spent 10 minutes with the patient with telehealth technology discussing the above problems.     Medication Adjustments/Labs and Tests Ordered: Current medicines are reviewed at length with the patient today.  Concerns regarding medicines are outlined above.   Tests Ordered: No orders of the defined types were placed in this encounter.   Medication Changes: No orders of the defined types were placed in this encounter.   Follow Up:  In Person prn  Signed, Yetta Flock  03/01/2021 4:28 PM    [place]

## 2021-03-10 ENCOUNTER — Other Ambulatory Visit: Payer: Self-pay | Admitting: Physician Assistant

## 2021-03-26 IMAGING — CT NUCLEAR MEDICINE PET IMAGE INITIAL (PI) SKULL BASE TO THIGH
8 series · 16 of 16 positions shown · non-contrast
Comparison: Abdomen/pelvis CT 09/12/2018

CLINICAL DATA: Initial treatment strategy for innumerable lytic
bone lesions with lymphadenopathy. Presumed metastatic disease of
unknown primary.

EXAM:
NUCLEAR MEDICINE PET SKULL BASE TO THIGH
TECHNIQUE: 8.4 mCi F-18 FDG was injected intravenously. Full-ring PET imaging
was performed from the skull base to thigh after the radiotracer. CT
data was obtained and used for attenuation correction and anatomic
localization.
Fasting blood glucose: 96 mg/dl

[Series 3: pet sk_thigh ac · axial · 5.0mm · 4.07mm/px · z∈[-762,+146]mm · 3 of 228 slices shown]
[im 1/228]
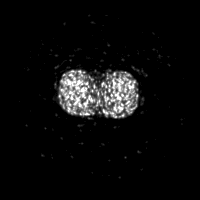
[im 114/228]
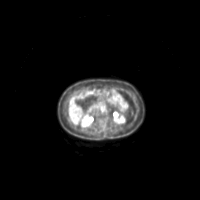
[im 228/228]
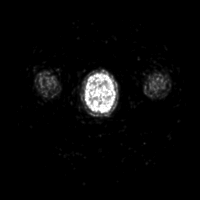

[Series 4: ct sk_thigh 5.0 b31f · axial · 0.98mm/px · z∈[-762,+146]mm · 3 of 228 slices shown]
[im 1/228  soft-tissue]
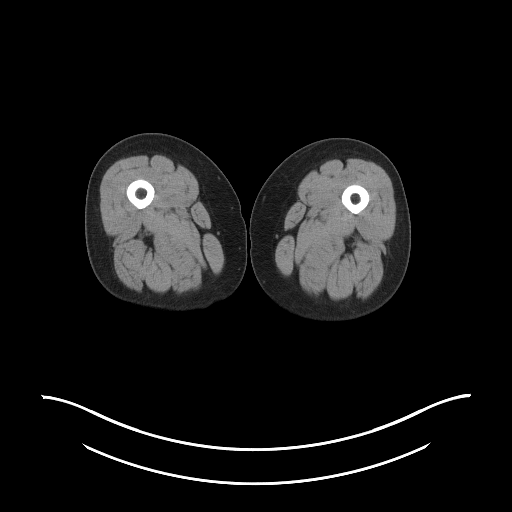
[im 114/228  soft-tissue]
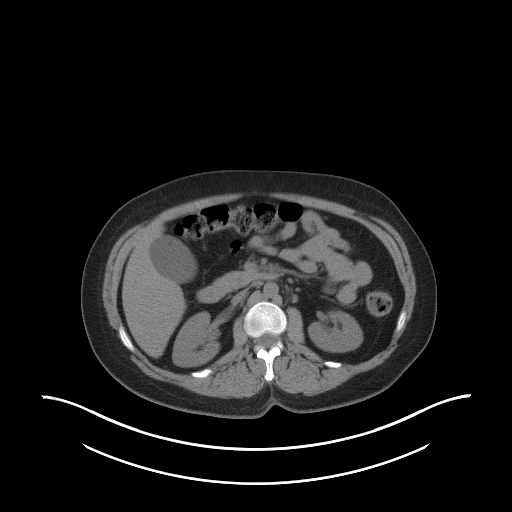
[im 228/228  soft-tissue]
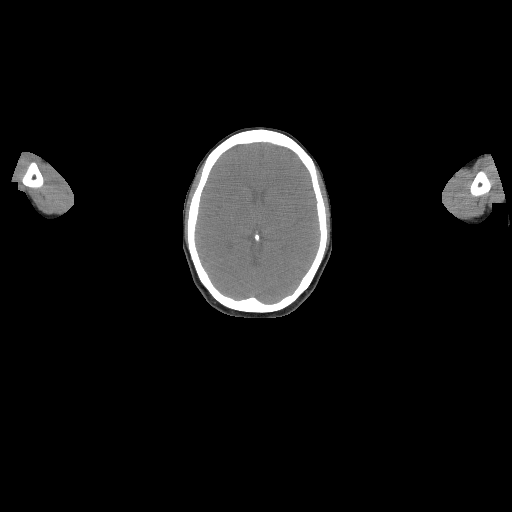

[Series 5: pet sk_thigh nac · axial · 5.0mm · 4.07mm/px · z∈[-762,+146]mm · 3 of 228 slices shown]
[im 1/228]
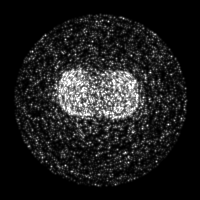
[im 114/228]
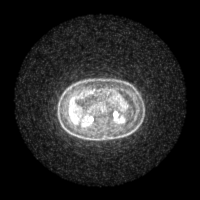
[im 228/228]
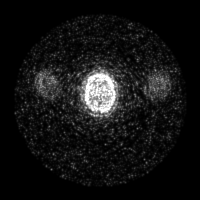

[Series 8: ct sk_thigh 5.0 b70f (id)_bone · axial · 0.65mm/px · 1 of 65 slices shown]
[im 1/65  soft-tissue]
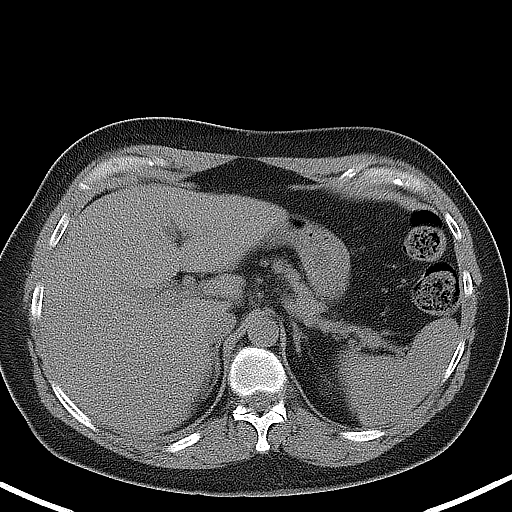

[Series 603: range-ct sk_thigh 5.0 (id)<alpha range> · 1 of 84 slices shown (1 of 2)]
[im 1/84]
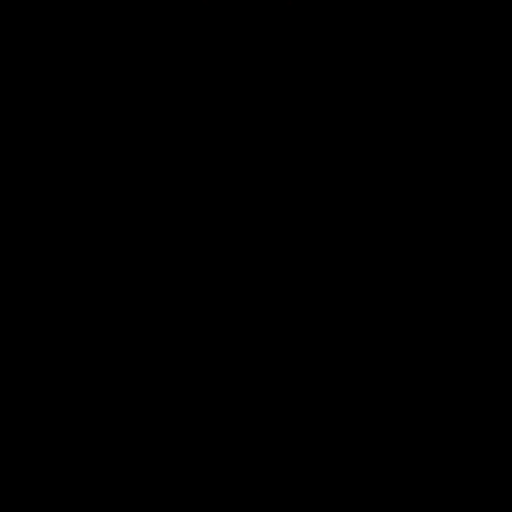

[Series 604: mip range · coronal · 1.88mm/px · 1 of 32 slices shown]
[im 1/32]
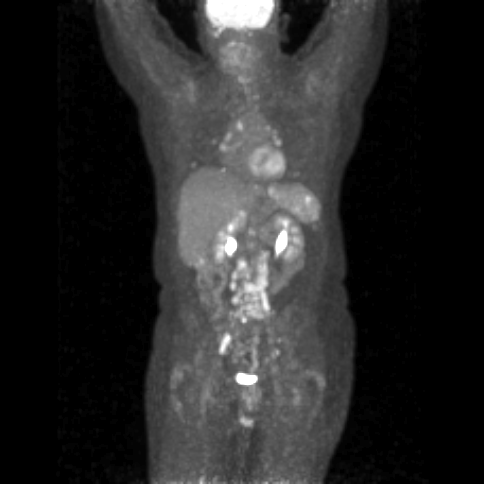

[Series 605: range-ct sk_thigh 5.0 (id)<alpha range> · 3 of 222 slices shown (2 of 2)]
[im 1/222]
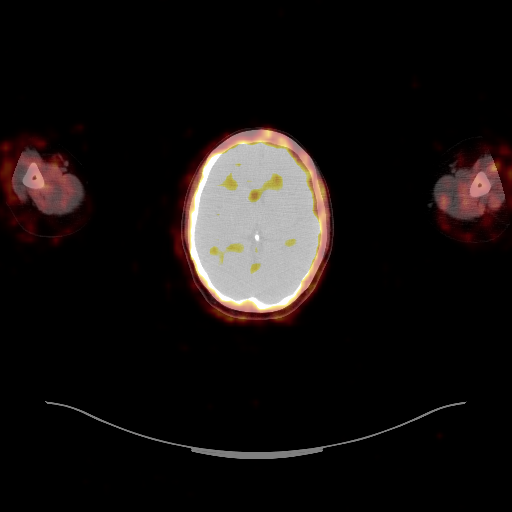
[im 111/222]
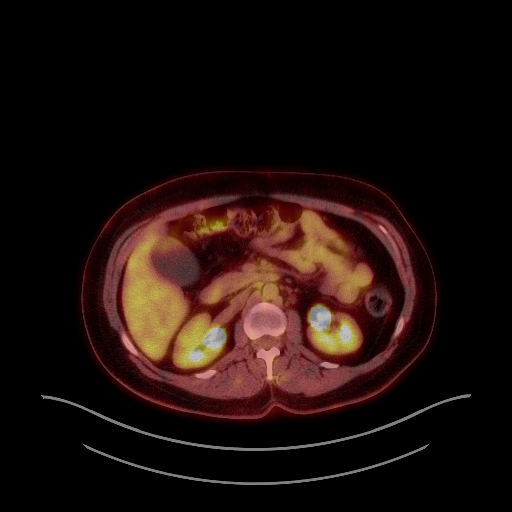
[im 222/222]
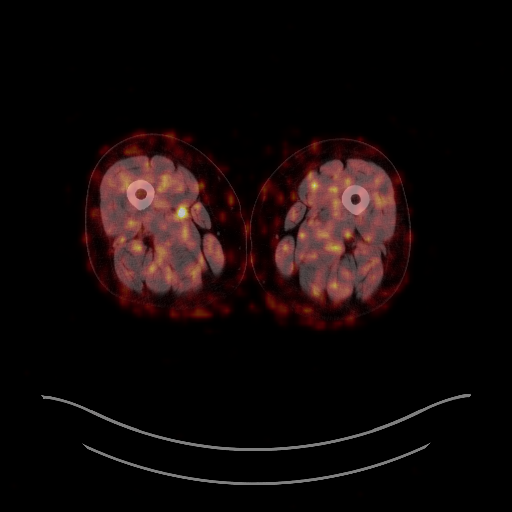

[Series 1075: results mm oncology reading · 1.0mm · 0.89mm/px · 1 of 12 slices shown]
[im 1/12]
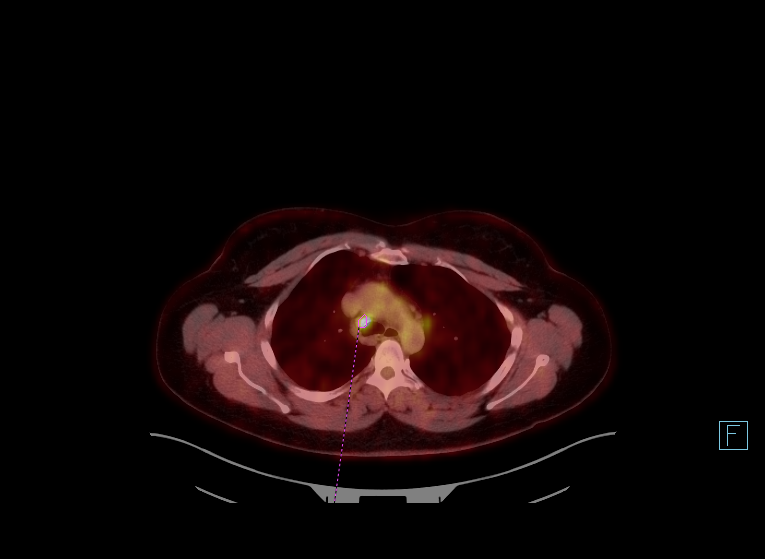

[16 of 16 positions shown; findings below may reference images not displayed]

FINDINGS: Mediastinal blood pool activity: SUV max

NECK: No hypermetabolic lymph nodes in the neck.

Incidental CT findings: none

CHEST: 7 mm short axis precarinal node (60/4) is hypermetabolic with
SUV max = 6.8. 7 mm short axis AP window node (61/4) is
hypermetabolic with SUV max = 4.1. Hypermetabolic lymphadenopathy is
identified in both hilar regions. No hypermetabolic axillary
lymphadenopathy.

9 mm nodule in the medial right middle lobe (47/8) is hypermetabolic
with SUV max = 4.3.

Incidental CT findings: none

ABDOMEN/PELVIS: Hypermetabolic lesion in the medial spleen
demonstrates SUV max = 6.1. Lateral spleen demonstrates
hypermetabolic lesion with SUV max = 9.1. noncontrast CT today shows
only subtle suggestion of underlying lesions, but hypermetabolism on
the current PET images corresponds to the hypoattenuating lesion
seen on previous diagnostic CT of 09/12/2018. Other scattered areas
of hypermetabolism are seen within the splenic parenchyma.

11 mm short axis portal caval node is hypermetabolic with SUV max =
7.4. Other hypermetabolic lymph nodes are seen in the hepato
duodenal ligament. Hypermetabolic retroperitoneal lymph nodes are
associated. Index node in the lower left para-aortic region (127/4)
demonstrates SUV max = 8.7 12 mm short axis left common iliac node
(140/4) demonstrates SUV max = 10.1 9 mm short axis right common
iliac node demonstrates SUV max = 9.3. A cluster of right pelvic
sidewall nodes (163/4) has SUV max = 13.6.

Incidental CT findings: Nonobstructing stones noted in the right
kidney. There is abdominal aortic atherosclerosis without aneurysm.

SKELETON: Status post lumbosacral fusion with uptake in the region
of the fusion hardware likely reactive. Small focus of
hypermetabolism identified in the T10 vertebral body ( SUV max =
4.3) without underlying discrete bone lesion on CT images.

Incidental CT findings: none
IMPRESSION: 1. Mild lymphadenopathy in the chest, abdomen, and pelvis is
hypermetabolic on today's PET imaging. Comparing back to previous CT
scans of 09/12/2018 and 12/27/2017, these lymph nodes have been
essentially stable for 1 year with measurements obtained for
representative nodes today essentially the same when comparing to
measurements obtained on the study from 12/27/2017. The low-density
splenic lesions noted on CT scan of 09/12/2018 have evolved when
comparing back to the 12/27/2017 CT. While imaging features today
could certainly be related to metastatic disease or lymphoma, the
relative stability over 1 year would be somewhat unusual given the
lack of interval therapy. Sarcoidosis or other lymphoproliferative
etiology also considerations.
2. By report, an outside MRI of 12/13/2018 documented "innumerable
bone lesions". There is a single subtle hypermetabolic focus in the
T10 vertebral body without associated correlate abnormality on CT
imaging. Otherwise, the only other bony hypermetabolism is in the
region of the patient's lumbosacral fusion, probably related to the
surgery.

## 2021-04-01 ENCOUNTER — Other Ambulatory Visit: Payer: Self-pay | Admitting: Family Medicine

## 2021-05-10 ENCOUNTER — Other Ambulatory Visit: Payer: Self-pay | Admitting: Family Medicine

## 2021-06-20 DIAGNOSIS — Z5321 Procedure and treatment not carried out due to patient leaving prior to being seen by health care provider: Secondary | ICD-10-CM | POA: Diagnosis not present

## 2021-06-20 DIAGNOSIS — R1031 Right lower quadrant pain: Secondary | ICD-10-CM | POA: Diagnosis not present

## 2021-06-22 ENCOUNTER — Other Ambulatory Visit: Payer: Self-pay

## 2021-06-22 ENCOUNTER — Encounter: Payer: Self-pay | Admitting: Physician Assistant

## 2021-06-22 ENCOUNTER — Ambulatory Visit (INDEPENDENT_AMBULATORY_CARE_PROVIDER_SITE_OTHER): Payer: BC Managed Care – PPO | Admitting: Physician Assistant

## 2021-06-22 VITALS — BP 126/80 | HR 95 | Temp 97.2°F | Ht 62.0 in | Wt 167.0 lb

## 2021-06-22 DIAGNOSIS — R1031 Right lower quadrant pain: Secondary | ICD-10-CM

## 2021-06-22 DIAGNOSIS — K3580 Unspecified acute appendicitis: Secondary | ICD-10-CM | POA: Diagnosis not present

## 2021-06-22 DIAGNOSIS — K37 Unspecified appendicitis: Secondary | ICD-10-CM | POA: Diagnosis not present

## 2021-06-22 DIAGNOSIS — K353 Acute appendicitis with localized peritonitis, without perforation or gangrene: Secondary | ICD-10-CM | POA: Diagnosis not present

## 2021-06-22 DIAGNOSIS — K3532 Acute appendicitis with perforation and localized peritonitis, without abscess: Secondary | ICD-10-CM | POA: Diagnosis not present

## 2021-06-22 DIAGNOSIS — R1030 Lower abdominal pain, unspecified: Secondary | ICD-10-CM | POA: Diagnosis not present

## 2021-06-22 DIAGNOSIS — R109 Unspecified abdominal pain: Secondary | ICD-10-CM | POA: Diagnosis not present

## 2021-06-22 LAB — POCT URINALYSIS DIP (CLINITEK)
Bilirubin, UA: NEGATIVE
Glucose, UA: NEGATIVE mg/dL
Ketones, POC UA: NEGATIVE mg/dL
Nitrite, UA: NEGATIVE
POC PROTEIN,UA: 30 — AB
Spec Grav, UA: 1.025 (ref 1.010–1.025)
Urobilinogen, UA: 1 E.U./dL
pH, UA: 6 (ref 5.0–8.0)

## 2021-06-22 NOTE — Progress Notes (Signed)
Acute Office Visit  Subjective:    Patient ID: Bonnie Parker, female    DOB: [birth year], 50 y.o.   MRN: [id number]  Chief Complaint  Patient presents with   Abdominal Pain    lower    HPI Patient is in today for lower abdominal pain - states started on Sunday She states that now she is having to take pain pills that she had at home to ease the pain Has not had fever but some nausea - no vomiting Appetite is decreased - last bm was yesterday Has had mild dysuria States pain has worsened - worse to touch and with certain movements  Past Medical History:  Diagnosis Date   Age-related osteoporosis without current pathological fracture    Anxiety    Arthritis    Asthma    Atrophy of thyroid (acquired)    Depression    Drug-induced myopathy    Gastro-esophageal reflux disease without esophagitis    Hypothyroidism    Lumbar spinal stenosis    Mixed hyperlipidemia    Polyarthralgia    Sarcoidosis of other sites     Past Surgical History:  Procedure Laterality Date   ABDOMINAL HYSTERECTOMY  1999   BACK SURGERY  2005   Dr. Marcha Dutton EAR SURGERY     LUMBAR LAMINECTOMY/DECOMPRESSION MICRODISCECTOMY N/A 06/21/2017   Procedure: Lumbar decompression L3-4 ;  Surgeon: Melina Schools, MD;  Location: [facility];  Service: Orthopedics;  Laterality: N/A;  3 hrs   SPINAL CORD STIMULATOR INSERTION N/A 03/13/2019   Procedure: LUMBAR SPINAL CORD STIMULATOR INSERTION;  Surgeon: Melina Schools, MD;  Location: [facility];  Service: Orthopedics;  Laterality: N/A;   TONSILLECTOMY      Family History  Problem Relation Age of Onset   Hypertension Mother    Cancer - Lung Father     Social History   Socioeconomic History   Marital status: Married    Spouse name: Not on file   Number of children: 4   Years of education: Not on file   Highest education level: Not on file  Occupational History   Not on file  Tobacco Use   Smoking status: Never   Smokeless tobacco: Never  Vaping Use    Vaping Use: Never used  Substance and Sexual Activity   Alcohol use: No   Drug use: No   Sexual activity: Not on file  Other Topics Concern   Not on file  Social History Narrative   Not on file   Social Determinants of Health   Financial Resource Strain: Not on file  Food Insecurity: Not on file  Transportation Needs: Not on file  Physical Activity: Not on file  Stress: Not on file  Social Connections: Not on file  Intimate Partner Violence: Not on file    Outpatient Medications Prior to Visit  Medication Sig Dispense Refill   acetaminophen (TYLENOL) 500 MG tablet Take 1,000 mg by mouth every 6 (six) hours as needed for moderate pain or headache.     albuterol (VENTOLIN HFA) 108 (90 Base) MCG/ACT inhaler INHALE 2 PUFFS BY MOUTH EVERY 6 HOURS AS NEEDED FOR WHEEZING FOR SHORTNESS OF BREATH 9 g 0   alendronate (FOSAMAX) 70 MG tablet Take 1 tablet (70 mg total) by mouth every Tuesday. Take with a full glass of water on an empty stomach. 12 tablet 3   benzonatate (TESSALON) 100 MG capsule Take 1 capsule (100 mg total) by mouth 2 (two) times daily as needed for cough.  20 capsule 1   buPROPion (WELLBUTRIN XL) 300 MG 24 hr tablet TAKE 1 TABLET BY MOUTH  DAILY 90 tablet 3   clotrimazole-betamethasone (LOTRISONE) cream Apply 1 application topically 2 (two) times daily. 30 g 0   ezetimibe (ZETIA) 10 MG tablet Take 10 mg by mouth daily.     fenofibrate 160 MG tablet Take 1 tablet by mouth once daily 30 tablet 0   folic acid (FOLVITE) 1 MG tablet Take 1 mg by mouth daily.     HYDROcodone-acetaminophen (NORCO) 10-325 MG tablet Take 1 tablet by mouth every 6 (six) hours as needed for moderate pain.     ibuprofen (ADVIL) 200 MG tablet Take 200 mg by mouth every 6 (six) hours as needed.     levothyroxine (SYNTHROID) 50 MCG tablet TAKE 1 TABLET BY MOUTH  DAILY BEFORE BREAKFAST 90 tablet 3   lisinopril (ZESTRIL) 10 MG tablet TAKE 1 TABLET BY MOUTH  DAILY 90 tablet 3   methocarbamol (ROBAXIN) 750 MG  tablet Take 1,500 mg by mouth 3 (three) times daily.     Methotrexate Sodium (METHOTREXATE, PF,) 50 MG/2ML injection      pantoprazole (PROTONIX) 40 MG tablet TAKE 1 TABLET BY MOUTH IN  THE EVENING 90 tablet 3   sertraline (ZOLOFT) 100 MG tablet TAKE 1 TABLET BY MOUTH  DAILY 90 tablet 0   silver sulfADIAZINE (SILVADENE) 1 % cream Apply 1 application topically daily. 50 g 0   TUBERCULIN SYR 1CC/27GX1/2" 27G X 1/2" 1 ML MISC 0.5 mLs by Misc.(Non-Drug; Combo Route) route every 7 days. See methotrexate rx for directions.     Vitamin D, Ergocalciferol, (DRISDOL) 1.25 MG (50000 UNIT) CAPS capsule TAKE 1 CAPSULE BY MOUTH TWICE A WEEK 24 capsule 0   zolpidem (AMBIEN) 10 MG tablet TAKE 1 TABLET BY MOUTH AT  BEDTIME AS NEEDED 30 tablet 0   No facility-administered medications prior to visit.    Allergies  Allergen Reactions   Other Itching    blisters   Tape Other (See Comments) and Itching    blisters   Crestor [Rosuvastatin] Other (See Comments)    MYALGIAS   Lipitor [Atorvastatin] Other (See Comments)    MYALGIAS    Review of Systems CONSTITUTIONAL:see HPI E/N/T: Negative for ear pain, nasal congestion and sore throat.  CARDIOVASCULAR: Negative for chest pain, dizziness, palpitations and pedal edema.  RESPIRATORY: Negative for recent cough and dyspnea.  GASTROINTESTINAL:see HPI INTEGUMENTARY: Negative for rash.       Objective:   PHYSICAL EXAM:   VS: BP 126/80 (BP Location: Left Arm, Patient Position: Sitting, Cuff Size: Large)   Pulse 95   Temp (!) 97.2 F (36.2 C) (Temporal)   Ht 5' 2"  (1.575 m)   Wt 167 lb (75.8 kg)   LMP 10/09/1997 (Within Months) Comment: hysterectomy 1999  SpO2 97%   BMI 30.54 kg/m   GEN: Well nourished, well developed, in moderate discomfort Cardiac: RRR; no murmurs, rubs, or gallops,no edema -  Respiratory:  normal respiratory rate and pattern with no distress - normal breath sounds with no rales, rhonchi, wheezes or rubs GI: bowel sounds  diminished - moderate tenderness to abdomen but with signficant guarding and rebound Psych: euthymic mood, appropriate affect and demeanor  Office Visit on 06/22/2021  Component Date Value Ref Range Status   Color, UA 06/22/2021 straw (A) yellow Final   Clarity, UA 06/22/2021 clear  clear Final   Glucose, UA 06/22/2021 negative  negative mg/dL Final   Bilirubin, UA 06/22/2021 negative  negative Final   Ketones, POC UA 06/22/2021 negative  negative mg/dL Final   Spec Grav, UA 06/22/2021 1.025  1.010 - 1.025 Final   Blood, UA 06/22/2021 large (A) negative Final   pH, UA 06/22/2021 6.0  5.0 - 8.0 Final   POC PROTEIN,UA 06/22/2021 =30 (A) negative, trace Final   Urobilinogen, UA 06/22/2021 1.0  0.2 or 1.0 E.U./dL Final   Nitrite, UA 06/22/2021 Negative  Negative Final   Leukocytes, UA 06/22/2021 Small (1+) (A) Negative Final     Health Maintenance Due  Topic Date Due   Pneumococcal Vaccine 14-3 Years old (1 - PCV) Never done   Zoster Vaccines- Shingrix (1 of 2) Never done   PAP SMEAR-Modifier  Never done   COLONOSCOPY (Pts 45-42yr Insurance coverage will need to be confirmed)  Never done   COVID-19 Vaccine (4 - Booster for Moderna series) 08/19/2020   MAMMOGRAM  01/25/2021   INFLUENZA VACCINE  05/09/2021    There are no preventive care reminders to display for this patient.   Lab Results  Component Value Date   TSH 0.971 07/14/2020   Lab Results  Component Value Date   WBC 7.6 12/28/2020   HGB 12.4 12/28/2020   HCT 37.8 12/28/2020   MCV 92 12/28/2020   PLT 398 12/28/2020   Lab Results  Component Value Date   NA 141 07/14/2020   K 4.2 07/14/2020   CO2 19 (L) 07/14/2020   GLUCOSE 103 (H) 07/14/2020   BUN 9 07/14/2020   CREATININE 1.05 (H) 12/28/2020   BILITOT 0.2 12/28/2020   ALKPHOS 54 12/28/2020   AST 14 12/28/2020   ALT 19 12/28/2020   PROT 7.4 12/28/2020   ALBUMIN 4.9 12/28/2020   CALCIUM 8.9 07/14/2020   ANIONGAP 7 03/11/2019   EGFR 64 12/28/2020   Lab  Results  Component Value Date   CHOL 219 (H) 07/14/2020   Lab Results  Component Value Date   HDL 39 (L) 07/14/2020   Lab Results  Component Value Date   LDLCALC 128 (H) 07/14/2020   Lab Results  Component Value Date   TRIG 293 (H) 07/14/2020   Lab Results  Component Value Date   CHOLHDL 5.6 (H) 07/14/2020   No results found for: HGBA1C     Assessment & Plan:  1. Lower abdominal pain - POCT URINALYSIS DIP (CLINITEK) - Urine Culture  2. Right lower quadrant abdominal pain - CT Abdomen Pelvis W Contrast - stat - CBC with Differential/Platelet - Comprehensive metabolic panel    No orders of the defined types were placed in this encounter.   Orders Placed This Encounter  Procedures   Urine Culture   CT Abdomen Pelvis W Contrast   CBC with Differential/Platelet   Comprehensive metabolic panel   POCT URINALYSIS DIP (CLINITEK)     Follow-up: No follow-ups on file.  An After Visit Summary was printed and given to the patient.  [facility] [phone]

## 2021-06-25 LAB — URINE CULTURE

## 2021-07-07 ENCOUNTER — Other Ambulatory Visit: Payer: Self-pay | Admitting: Physician Assistant

## 2021-07-14 ENCOUNTER — Other Ambulatory Visit: Payer: Self-pay

## 2021-07-14 MED ORDER — FENOFIBRATE 160 MG PO TABS: 160.0000 mg | ORAL_TABLET | Freq: Every day | ORAL | 0 refills | Status: DC

## 2021-08-06 ENCOUNTER — Other Ambulatory Visit: Payer: Self-pay | Admitting: Physician Assistant

## 2021-08-24 ENCOUNTER — Other Ambulatory Visit: Payer: Self-pay | Admitting: Family Medicine

## 2021-08-24 DIAGNOSIS — I10 Essential (primary) hypertension: Secondary | ICD-10-CM

## 2021-08-29 ENCOUNTER — Ambulatory Visit (INDEPENDENT_AMBULATORY_CARE_PROVIDER_SITE_OTHER): Payer: BC Managed Care – PPO

## 2021-08-29 DIAGNOSIS — Z23 Encounter for immunization: Secondary | ICD-10-CM

## 2021-09-09 ENCOUNTER — Encounter: Payer: Self-pay | Admitting: Nurse Practitioner

## 2021-09-09 ENCOUNTER — Other Ambulatory Visit: Payer: Self-pay | Admitting: Nurse Practitioner

## 2021-09-09 ENCOUNTER — Ambulatory Visit: Payer: BC Managed Care – PPO | Admitting: Nurse Practitioner

## 2021-09-09 ENCOUNTER — Other Ambulatory Visit: Payer: Self-pay

## 2021-09-09 VITALS — BP 148/68 | HR 89 | Temp 97.6°F | Ht 62.0 in | Wt 167.0 lb

## 2021-09-09 DIAGNOSIS — R319 Hematuria, unspecified: Secondary | ICD-10-CM | POA: Diagnosis not present

## 2021-09-09 DIAGNOSIS — R10A1 Flank pain, right side: Secondary | ICD-10-CM

## 2021-09-09 DIAGNOSIS — Z79899 Other long term (current) drug therapy: Secondary | ICD-10-CM

## 2021-09-09 DIAGNOSIS — D84821 Immunodeficiency due to drugs: Secondary | ICD-10-CM

## 2021-09-09 DIAGNOSIS — N200 Calculus of kidney: Secondary | ICD-10-CM | POA: Diagnosis not present

## 2021-09-09 DIAGNOSIS — M25551 Pain in right hip: Secondary | ICD-10-CM

## 2021-09-09 DIAGNOSIS — R109 Unspecified abdominal pain: Secondary | ICD-10-CM | POA: Diagnosis not present

## 2021-09-09 DIAGNOSIS — Z981 Arthrodesis status: Secondary | ICD-10-CM | POA: Diagnosis not present

## 2021-09-09 DIAGNOSIS — M0579 Rheumatoid arthritis with rheumatoid factor of multiple sites without organ or systems involvement: Secondary | ICD-10-CM

## 2021-09-09 DIAGNOSIS — N3001 Acute cystitis with hematuria: Secondary | ICD-10-CM

## 2021-09-09 DIAGNOSIS — I700 Atherosclerosis of aorta: Secondary | ICD-10-CM | POA: Diagnosis not present

## 2021-09-09 LAB — POCT URINALYSIS DIP (CLINITEK)
Bilirubin, UA: NEGATIVE
Glucose, UA: NEGATIVE mg/dL
Ketones, POC UA: NEGATIVE mg/dL
Leukocytes, UA: NEGATIVE
Nitrite, UA: NEGATIVE
POC PROTEIN,UA: NEGATIVE
Spec Grav, UA: 1.025 (ref 1.010–1.025)
Urobilinogen, UA: NEGATIVE E.U./dL — AB
pH, UA: 6 (ref 5.0–8.0)

## 2021-09-09 MED ORDER — NITROFURANTOIN MONOHYD MACRO 100 MG PO CAPS: 100.0000 mg | ORAL_CAPSULE | Freq: Two times a day (BID) | ORAL | 0 refills | Status: DC

## 2021-09-09 MED ORDER — METHOCARBAMOL 750 MG PO TABS: 1500.0000 mg | ORAL_TABLET | Freq: Three times a day (TID) | ORAL | 1 refills | Status: DC

## 2021-09-09 MED ORDER — TAMSULOSIN HCL 0.4 MG PO CAPS: 0.4000 mg | ORAL_CAPSULE | Freq: Every day | ORAL | 3 refills | Status: DC

## 2021-09-09 MED ORDER — METHOCARBAMOL 500 MG PO TABS: 500.0000 mg | ORAL_TABLET | Freq: Three times a day (TID) | ORAL | 1 refills | Status: DC | PRN

## 2021-09-09 NOTE — Patient Instructions (Addendum)
Push fluids, especially water Obtain CT urogram and labs at [facility] today We will call you with results Tamsulosin 0.4 mg once daily   Renal Colic Renal colic is pain that is caused by a kidney stone. The pain can be sharp and very bad. It may be felt in the back, belly, side (flank), or groin. It can cause nausea. Renal colic can come and go. Follow these instructions at home: Medicines Take over-the-counter and prescription medicines only as told by your doctor. Do not drive or use heavy machinery while taking prescription pain medicine. Eating and drinking  Drink enough fluid to keep your pee (urine) pale yellow. You may be told to drink at least 8-10 glasses of water each day. Follow instructions from your doctor. If told, change your diet. This may include eating: Less salt (sodium). Eat less than 2 grams (2,000 mg) of salt per day. Less meat, poultry, fish, and eggs. More fruits and vegetables. Try not to eat spinach, rhubarb, sweet potatoes, or nuts. Follow instructions from your doctor about what foods and drinks to avoid. General instructions Keep all follow-up visits as told by your doctor. This is important. Collect pee samples as told by your doctor. Strain your pee every time you pee, as told by your doctor. Use the strainer that your doctor recommends. Do not throw out the kidney stone after passing it. Keep the stone so it can be tested by your doctor. Contact a doctor if: You have a fever or chills. Your pee smells bad or looks cloudy. You have pain or burning when you pee. Get help right away if: The pain in your side (flank) or your groin suddenly gets worse. You get confused. You pass out. Summary Renal colic is pain that is caused by a kidney stone. Take over-the-counter and prescription medicines only as told by your doctor. Drink enough fluid to keep your pee pale yellow. You may be told to drink at least 8-10 glasses of water each day. Follow  instructions from your doctor. Strain your pee every time you pee, as told by your doctor. Use the strainer that your doctor recommends. Do not throw out the kidney stone after passing it. Keep the stone so it can be tested by your doctor. This information is not intended to replace advice given to you by your health care provider. Make sure you discuss any questions you have with your health care provider. Document Revised: 05/30/2021 Document Reviewed: 05/30/2021 Elsevier Patient Education  [place].  Kidney stones are rock-like masses that form inside of the kidneys. Kidneys are organs that make pee (urine). A kidney stone may move into other parts of the urinary tract, including: The tubes that connect the kidneys to the bladder (ureters). The bladder. The tube that carries urine out of the body (urethra). Kidney stones can cause very bad pain and can block the flow of pee. The stone usually leaves your body (passes) through your pee. You may need to have a doctor take out the stone. What are the causes? Kidney stones may be caused by: A condition in which certain glands make too much parathyroid hormone (primary hyperparathyroidism). A buildup of a type of crystals in the bladder made of a chemical called uric acid. The body makes uric acid when you eat certain foods. Narrowing (stricture) of one or both of the ureters. A kidney blockage that you were born with. Past surgery on the kidney or the ureters, such as gastric bypass surgery. What  increases the risk? You are more likely to develop this condition if: You have had a kidney stone in the past. You have a family history of kidney stones. You do not drink enough water. You eat a diet that is high in protein, salt (sodium), or sugar. You are overweight or very overweight (obese). What are the signs or symptoms? Symptoms of a kidney stone may include: Pain in the side of the belly, right below the ribs (flank pain). Pain  usually spreads (radiates) to the groin. Needing to pee often or right away (urgently). Pain when going pee (urinating). Blood in your pee (hematuria). Feeling like you may vomit (nauseous). Vomiting. Fever and chills. How is this treated? Treatment depends on the size, location, and makeup of the kidney stones. The stones will often pass out of the body through peeing. You may need to: Drink more fluid to help pass the stone. In some cases, you may be given fluids through an IV tube put into one of your veins at the hospital. Take medicine for pain. Make changes in your diet to help keep kidney stones from coming back. Sometimes, medical procedures are needed to remove a kidney stone. This may involve: A procedure to break up kidney stones using a beam of light (laser) or shock waves. Surgery to remove the kidney stones. Follow these instructions at home: Medicines Take over-the-counter and prescription medicines only as told by your doctor. Ask your doctor if the medicine prescribed to you requires you to avoid driving or using heavy machinery. Eating and drinking Drink enough fluid to keep your pee pale yellow. You may be told to drink at least 8-10 glasses of water each day. This will help you pass the stone. If told by your doctor, change your diet. This may include: Limiting how much salt you eat. Eating more fruits and vegetables. Limiting how much meat, poultry, fish, and eggs you eat. Follow instructions from your doctor about eating or drinking restrictions. General instructions Collect pee samples as told by your doctor. You may need to collect a pee sample: 24 hours after a stone comes out. 8-12 weeks after a stone comes out, and every 6-12 months after that. Strain your pee every time you pee (urinate), for as long as told. Use the strainer that your doctor recommends. Do not throw out the stone. Keep it so that it can be tested by your doctor. Keep all follow-up visits  as told by your doctor. This is important. You may need follow-up tests. How is this prevented? To prevent another kidney stone: Drink enough fluid to keep your pee pale yellow. This is the best way to prevent kidney stones. Eat healthy foods. Avoid certain foods as told by your doctor. You may be told to eat less protein. Stay at a healthy weight. Where to find more information [place] ([facility]): www.kidney.[facility]): www.urologyhealth.org Contact a doctor if: You have pain that gets worse or does not get better with medicine. Get help right away if: You have a fever or chills. You get very bad pain. You get new pain in your belly (abdomen). You pass out (faint). You cannot pee. Summary Kidney stones are rock-like masses that form inside of the kidneys. Kidney stones can cause very bad pain and can block the flow of pee. The stones will often pass out of the body through peeing. Drink enough fluid to keep your pee pale yellow. This information is not intended to replace advice  given to you by your health care provider. Make sure you discuss any questions you have with your health care provider. Document Revised: 05/30/2021 Document Reviewed: 05/30/2021 Elsevier Patient Education  [place].

## 2021-09-09 NOTE — Progress Notes (Signed)
Acute Office Visit  Subjective:    Patient ID: Bonnie Parker, female    DOB: 1971/01/24, 50 y.o.   MRN: [id number]  Chief Complaint  Patient presents with   Right hip pain    HPI Patient is in today for right hip and flank pain. [name] has a past medical history of RA. Current treatment includes methotrexate, Norco, and Ibuprofen. States pain to right hip feels different than previous RA pain, radiates to right groin area. She recently underwent lap appendectomy 06/22/21 secondary to appendicitis and peritonitis. Denies post-op complications. States right renal calculi was incidental finding on CT of abd/pelvis prior to appendectomy.   Pain  She reports new onset right hip/flank pain. was not an injury that may have caused the pain. The pain started about a week ago and is staying constant. The pain does radiate to right groin. The pain is described as aching, burning, sharp, soreness, stabbing, and stiffness, is 8/10 in intensity, occurring intermittently. Symptoms are worse in the: evening  Aggravating factors: standing and walking Relieving factors: bending forwards.  She has tried application of heat, application of ice, acetaminophen, prescription pain relievers, and topical anesthetics with no relief.    Past Medical History:  Diagnosis Date   Age-related osteoporosis without current pathological fracture    Anxiety    Arthritis    Asthma    Atrophy of thyroid (acquired)    Depression    Drug-induced myopathy    Gastro-esophageal reflux disease without esophagitis    Hypothyroidism    Lumbar spinal stenosis    Mixed hyperlipidemia    Polyarthralgia    Sarcoidosis of other sites     Past Surgical History:  Procedure Laterality Date   ABDOMINAL HYSTERECTOMY  1999   BACK SURGERY  2005   Dr. Marcha Dutton EAR SURGERY     LUMBAR LAMINECTOMY/DECOMPRESSION MICRODISCECTOMY N/A 06/21/2017   Procedure: Lumbar decompression L3-4 ;  Surgeon: Melina Schools, MD;  Location:  [place];  Service: Orthopedics;  Laterality: N/A;  3 hrs   SPINAL CORD STIMULATOR INSERTION N/A 03/13/2019   Procedure: LUMBAR SPINAL CORD STIMULATOR INSERTION;  Surgeon: Melina Schools, MD;  Location: [place];  Service: Orthopedics;  Laterality: N/A;   TONSILLECTOMY      Family History  Problem Relation Age of Onset   Hypertension Mother    Cancer - Lung Father     Social History   Socioeconomic History   Marital status: Married    Spouse name: Not on file   Number of children: 4   Years of education: Not on file   Highest education level: Not on file  Occupational History   Not on file  Tobacco Use   Smoking status: Never   Smokeless tobacco: Never  Vaping Use   Vaping Use: Never used  Substance and Sexual Activity   Alcohol use: No   Drug use: No   Sexual activity: Not on file  Other Topics Concern   Not on file  Social History Narrative   Not on file   Social Determinants of Health   Financial Resource Strain: Not on file  Food Insecurity: Not on file  Transportation Needs: Not on file  Physical Activity: Not on file  Stress: Not on file  Social Connections: Not on file  Intimate Partner Violence: Not on file    Outpatient Medications Prior to Visit  Medication Sig Dispense Refill   acetaminophen (TYLENOL) 500 MG tablet Take 1,000 mg by mouth every 6 (  six) hours as needed for moderate pain or headache.     albuterol (VENTOLIN HFA) 108 (90 Base) MCG/ACT inhaler INHALE 2 PUFFS BY MOUTH EVERY 6 HOURS AS NEEDED FOR WHEEZING FOR SHORTNESS OF BREATH 9 g 0   alendronate (FOSAMAX) 70 MG tablet Take 1 tablet (70 mg total) by mouth every Tuesday. Take with a full glass of water on an empty stomach. 12 tablet 3   benzonatate (TESSALON) 100 MG capsule Take 1 capsule (100 mg total) by mouth 2 (two) times daily as needed for cough. 20 capsule 1   buPROPion (WELLBUTRIN XL) 300 MG 24 hr tablet TAKE 1 TABLET BY MOUTH  DAILY 90 tablet 3   clotrimazole-betamethasone (LOTRISONE) cream  Apply 1 application topically 2 (two) times daily. 30 g 0   ezetimibe (ZETIA) 10 MG tablet Take 10 mg by mouth daily.     fenofibrate 160 MG tablet TAKE 1 TABLET BY MOUTH  DAILY 90 tablet 0   folic acid (FOLVITE) 1 MG tablet Take 1 mg by mouth daily.     HYDROcodone-acetaminophen (NORCO) 10-325 MG tablet Take 1 tablet by mouth every 6 (six) hours as needed for moderate pain.     ibuprofen (ADVIL) 200 MG tablet Take 200 mg by mouth every 6 (six) hours as needed.     levothyroxine (SYNTHROID) 50 MCG tablet TAKE 1 TABLET BY MOUTH  DAILY BEFORE BREAKFAST 90 tablet 3   lisinopril (ZESTRIL) 10 MG tablet TAKE 1 TABLET BY MOUTH  DAILY 90 tablet 0   methocarbamol (ROBAXIN) 750 MG tablet Take 1,500 mg by mouth 3 (three) times daily.     Methotrexate Sodium (METHOTREXATE, PF,) 50 MG/2ML injection      pantoprazole (PROTONIX) 40 MG tablet TAKE 1 TABLET BY MOUTH IN  THE EVENING 90 tablet 3   sertraline (ZOLOFT) 100 MG tablet TAKE 1 TABLET BY MOUTH  DAILY 90 tablet 0   silver sulfADIAZINE (SILVADENE) 1 % cream Apply 1 application topically daily. 50 g 0   TUBERCULIN SYR 1CC/27GX1/2" 27G X 1/2" 1 ML MISC 0.5 mLs by Misc.(Non-Drug; Combo Route) route every 7 days. See methotrexate rx for directions.     Vitamin D, Ergocalciferol, (DRISDOL) 1.25 MG (50000 UNIT) CAPS capsule TAKE 1 CAPSULE BY MOUTH TWICE A WEEK 24 capsule 0   zolpidem (AMBIEN) 10 MG tablet TAKE 1 TABLET BY MOUTH AT  BEDTIME AS NEEDED 30 tablet 0   No facility-administered medications prior to visit.    Allergies  Allergen Reactions   Other Itching    blisters   Tape Other (See Comments) and Itching    blisters   Crestor [Rosuvastatin] Other (See Comments)    MYALGIAS   Lipitor [Atorvastatin] Other (See Comments)    MYALGIAS    Review of Systems  Constitutional:  Positive for appetite change (decreased appetite).  Musculoskeletal:  Positive for arthralgias (right hip, chronic pain due to RA), back pain (chronic), gait problem and  myalgias (chronic secondary to RA).  Allergic/Immunologic: Positive for immunocompromised state.  All other systems reviewed and are negative.     Objective:    Physical Exam Vitals reviewed. Exam conducted with a chaperone present.  Constitutional:      Appearance: Normal appearance.  HENT:     Head: Normocephalic.     Right Ear: Tympanic membrane normal.     Left Ear: Tympanic membrane normal.     Nose: Nose normal.     Mouth/Throat:     Mouth: Mucous membranes are moist.  Eyes:     Pupils: Pupils are equal, round, and reactive to light.  Cardiovascular:     Rate and Rhythm: Normal rate and regular rhythm.     Pulses: Normal pulses.     Heart sounds: Normal heart sounds.  Pulmonary:     Effort: Pulmonary effort is normal.     Breath sounds: Normal breath sounds.  Abdominal:     General: Bowel sounds are normal.     Palpations: Abdomen is soft.     Hernia: There is no hernia in the right inguinal area.  Musculoskeletal:        General: Tenderness (right hip and posterior thigh) present. No swelling.     Cervical back: Neck supple.     Right hip: Tenderness present. No deformity, lacerations or bony tenderness. Decreased range of motion. Normal strength.  Lymphadenopathy:     Lower Body: No right inguinal adenopathy.  Skin:    General: Skin is warm and dry.     Capillary Refill: Capillary refill takes less than 2 seconds.  Neurological:     General: No focal deficit present.     Mental Status: She is alert and oriented to person, place, and time.  Psychiatric:        Mood and Affect: Mood normal.        Behavior: Behavior normal.    LMP 10/09/1997 (Within Months) Comment: hysterectomy 1999 Wt Readings from Last 3 Encounters:  06/22/21 167 lb (75.8 kg)  03/01/21 166 lb (75.3 kg)  12/28/20 171 lb (77.6 kg)    Health Maintenance Due  Topic Date Due   Zoster Vaccines- Shingrix (1 of 2) Never done   PAP SMEAR-Modifier  Never done   COLONOSCOPY (Pts 45-32yr  Insurance coverage will need to be confirmed)  Never done   COVID-19 Vaccine (4 - Booster for Moderna series) 07/22/2020   MAMMOGRAM  01/25/2021       Lab Results  Component Value Date   TSH 0.971 07/14/2020   Lab Results  Component Value Date   WBC 7.6 12/28/2020   HGB 12.4 12/28/2020   HCT 37.8 12/28/2020   MCV 92 12/28/2020   PLT 398 12/28/2020   Lab Results  Component Value Date   NA 141 07/14/2020   K 4.2 07/14/2020   CO2 19 (L) 07/14/2020   GLUCOSE 103 (H) 07/14/2020   BUN 9 07/14/2020   CREATININE 1.05 (H) 12/28/2020   BILITOT 0.2 12/28/2020   ALKPHOS 54 12/28/2020   AST 14 12/28/2020   ALT 19 12/28/2020   PROT 7.4 12/28/2020   ALBUMIN 4.9 12/28/2020   CALCIUM 8.9 07/14/2020   ANIONGAP 7 03/11/2019   EGFR 64 12/28/2020   Lab Results  Component Value Date   CHOL 219 (H) 07/14/2020   Lab Results  Component Value Date   HDL 39 (L) 07/14/2020   Lab Results  Component Value Date   LDLCALC 128 (H) 07/14/2020   Lab Results  Component Value Date   TRIG 293 (H) 07/14/2020   Lab Results  Component Value Date   CHOLHDL 5.6 (H) 07/14/2020        Assessment & Plan:   1. Right renal stone - tamsulosin (FLOMAX) 0.4 MG CAPS capsule; Take 1 capsule (0.4 mg total) by mouth daily.  Dispense: 30 capsule; Refill: 3 - CT RENAL STONE STUDY[facility], no acute abd/pelvis findings  2. Acute right hip pain - CT RENAL STONE STUDY -continue methotrexate as  prescribed -if symptoms fail to improve, will order right hip x-ray on 09/12/21  3. Right flank pain - POCT URINALYSIS DIP (CLINITEK) - CT RENAL STONE STUDY -push fluids, especially water -continue Hydrocodone as prescribed  4. Hematuria, unspecified type - Urine Culture - CT RENAL STONE STUDY -push fluids, repeat UA in 2 weeks -consider urology consult if hematuria persists -CBC, stat performed at [facility] [facility], stat performed  at [facility]  5. Acute cystitis with hematuria - nitrofurantoin, macrocrystal-monohydrate, (MACROBID) 100 MG capsule; Take 1 capsule (100 mg total) by mouth 2 (two) times daily.  Dispense: 14 capsule; Refill: 0 - CT RENAL STONE STUDY -seek emergency medical care for severe worsening of symptoms: fever, chills, or severe pain  6. Rheumatoid arthritis involving multiple sites with positive rheumatoid factor (HCC) - continue methotrexate as prescribed -follow up with rheumatology as scheduled   7. Immunocompromised state due to drug therapy ([place])  -CBC, performed stat at [facility], pt notified via phone of results -CMP, performed stat at [facility], pt notified via phone of results   Follow-up: 2 weeks for repeat UA  I, Rip Harbour, NP, have reviewed all documentation for this visit. The documentation on 09/09/21 for the exam, diagnosis, procedures, and orders are all accurate and complete.   Signed, Rip Harbour, NP

## 2021-09-11 LAB — SPECIMEN STATUS REPORT

## 2021-09-11 LAB — URINE CULTURE: Organism ID, Bacteria: NO GROWTH

## 2021-09-12 ENCOUNTER — Telehealth: Payer: Self-pay | Admitting: Nurse Practitioner

## 2021-09-12 ENCOUNTER — Other Ambulatory Visit: Payer: Self-pay | Admitting: Nurse Practitioner

## 2021-09-12 ENCOUNTER — Other Ambulatory Visit: Payer: Self-pay | Admitting: Physician Assistant

## 2021-09-12 DIAGNOSIS — M25551 Pain in right hip: Secondary | ICD-10-CM

## 2021-09-12 NOTE — Telephone Encounter (Signed)
Pt continues to have right hip pain radiating to groin. Outpatient right hip x-ray ordered. Pt notified via phone of order awaiting her in an envelope at front desk of office. States she will pick it up tomorrow

## 2021-09-13 NOTE — Telephone Encounter (Signed)
Attempted to call pt. No answer, left VM for return call.   Royce Macadamia, [place] 09/13/21 3:30 PM

## 2021-09-14 DIAGNOSIS — M25551 Pain in right hip: Secondary | ICD-10-CM | POA: Diagnosis not present

## 2021-09-14 NOTE — Telephone Encounter (Signed)
Call left voicemail for patient to call office.

## 2021-09-15 ENCOUNTER — Other Ambulatory Visit: Payer: Self-pay

## 2021-09-15 DIAGNOSIS — M25551 Pain in right hip: Secondary | ICD-10-CM

## 2021-09-15 DIAGNOSIS — G8929 Other chronic pain: Secondary | ICD-10-CM

## 2021-09-15 NOTE — Telephone Encounter (Signed)
Attempted to call pt. No answer, left VM for call back.   Royce Macadamia, [place] 09/15/21 10:46 AM

## 2021-09-15 NOTE — Telephone Encounter (Signed)
Appointment scheduled.   Bonnie Parker, [place] 09/15/21 11:46 AM

## 2021-09-22 ENCOUNTER — Other Ambulatory Visit: Payer: Self-pay | Admitting: Physician Assistant

## 2021-09-25 ENCOUNTER — Other Ambulatory Visit: Payer: Self-pay | Admitting: Physician Assistant

## 2021-10-11 ENCOUNTER — Encounter: Payer: Self-pay | Admitting: Physician Assistant

## 2021-10-11 ENCOUNTER — Other Ambulatory Visit: Payer: Self-pay | Admitting: Physician Assistant

## 2021-10-11 ENCOUNTER — Other Ambulatory Visit: Payer: Self-pay

## 2021-10-11 ENCOUNTER — Ambulatory Visit: Payer: BC Managed Care – PPO | Admitting: Physician Assistant

## 2021-10-11 VITALS — BP 136/72 | HR 87 | Temp 98.4°F | Resp 18 | Ht 62.0 in | Wt 166.0 lb

## 2021-10-11 DIAGNOSIS — J060 Acute laryngopharyngitis: Secondary | ICD-10-CM | POA: Insufficient documentation

## 2021-10-11 DIAGNOSIS — I10 Essential (primary) hypertension: Secondary | ICD-10-CM | POA: Diagnosis not present

## 2021-10-11 DIAGNOSIS — E038 Other specified hypothyroidism: Secondary | ICD-10-CM

## 2021-10-11 DIAGNOSIS — E785 Hyperlipidemia, unspecified: Secondary | ICD-10-CM

## 2021-10-11 DIAGNOSIS — M818 Other osteoporosis without current pathological fracture: Secondary | ICD-10-CM | POA: Diagnosis not present

## 2021-10-11 DIAGNOSIS — Z1231 Encounter for screening mammogram for malignant neoplasm of breast: Secondary | ICD-10-CM

## 2021-10-11 LAB — POC COVID19 BINAXNOW: SARS Coronavirus 2 Ag: NEGATIVE

## 2021-10-11 MED ORDER — ALBUTEROL SULFATE HFA 108 (90 BASE) MCG/ACT IN AERS: INHALATION_SPRAY | RESPIRATORY_TRACT | 0 refills | Status: DC

## 2021-10-11 MED ORDER — CEPHALEXIN 500 MG PO CAPS: 500.0000 mg | ORAL_CAPSULE | Freq: Two times a day (BID) | ORAL | 0 refills | Status: DC

## 2021-10-11 NOTE — Progress Notes (Signed)
Established Patient Office Visit  Subjective:  Patient ID: Bonnie Parker, female    DOB: [birth year]  Age: 51 y.o. MRN: [id number]  CC:  Chief Complaint  Patient presents with   Hyperlipidemia   Hypertension   Cough    X2 weeks    HPI Bonnie Parker presents for hyperlipidemia  Mixed hyperlipidemia  Pt presents with hyperlipidemia. . Compliance with treatment has been good The patient is compliant with medications, maintains a low cholesterol diet , follows up as directed , and maintains an exercise regimen . The patient denies experiencing any hypercholesterolemia related symptoms. Pt currently on Zetia 45m qd and fenofibrate 1654m Pt with history of sarcoidosis - she sees specialist Dr [place]us [place] also [place] specialist Dr ShManuella Ghazi currently on methotrexate weekly for management - states she is due for follow up appt  Pt with history of anxiety and depression - she is currently taking zoloft 10023md and wellburtin XL 300m63m - states symptoms are stable at this time  Pt with history of osteoporosis- currently taking fosamax 70mg30mkly - is due for dexa scan 02/2022  Pt states she is no longer taking vit D supplements - she states rheumatologist told her to stop  Pt with history of hypothyroidism- is taking 50mcg64m- voices no problems or concerns  Pt is due to schedule mammogram - will schedule  Pt with history of GERD - stable on protonix 40mg q45mt states for the past 2 weeks she has had cough, congestion , sore throat and been having lots of sinus drainage - has had headache and malaise   Past Medical History:  Diagnosis Date   Age-related osteoporosis without current pathological fracture    Anxiety    Arthritis    Asthma    Atrophy of thyroid (acquired)    Depression    Drug-induced myopathy    Gastro-esophageal reflux disease without esophagitis    Hypothyroidism    Lumbar spinal stenosis    Mixed hyperlipidemia    Polyarthralgia     Sarcoidosis of other sites     Past Surgical History:  Procedure Laterality Date   ABDOMINAL HYSTERECTOMY  1999   BACK SURGERY  2005   Dr. Jones  Marcha DuttonRGERY     LUMBAR LAMINECTOMY/DECOMPRESSION MICRODISCECTOMY N/A 06/21/2017   Procedure: Lumbar decompression L3-4 ;  Surgeon: Brooks,Melina SchoolsLocation: [facility]; [place]: Orthopedics;  Laterality: N/A;  3 hrs   SPINAL CORD STIMULATOR INSERTION N/A 03/13/2019   Procedure: LUMBAR SPINAL CORD STIMULATOR INSERTION;  Surgeon: Brooks,Melina SchoolsLocation: [facility] OR; [place]: Orthopedics;  Laterality: N/A;   TONSILLECTOMY      Family History  Problem Relation Age of Onset   Hypertension Mother    Cancer - Lung Father     Social History   Socioeconomic History   Marital status: Married    Spouse name: Not on file   Number of children: 4   Years of education: Not on file   Highest education level: Not on file  Occupational History   Not on file  Tobacco Use   Smoking status: Never   Smokeless tobacco: Never  Vaping Use   Vaping Use: Never used  Substance and Sexual Activity   Alcohol use: No   Drug use: No   Sexual activity: Not on file  Other Topics Concern   Not on file  Social History Narrative   Not on file  Social Determinants of Health   Financial Resource Strain: Not on file  Food Insecurity: Not on file  Transportation Needs: Not on file  Physical Activity: Not on file  Stress: Not on file  Social Connections: Not on file  Intimate Partner Violence: Not on file     Current Outpatient Medications:    buPROPion (WELLBUTRIN XL) 300 MG 24 hr tablet, TAKE 1 TABLET BY MOUTH  DAILY, Disp: 90 tablet, Rfl: 0   cephALEXin (KEFLEX) 500 MG capsule, Take 1 capsule (500 mg total) by mouth 2 (two) times daily., Disp: 20 capsule, Rfl: 0   acetaminophen (TYLENOL) 500 MG tablet, Take 1,000 mg by mouth every 6 (six) hours as needed for moderate pain or headache., Disp: , Rfl:    albuterol (VENTOLIN HFA) 108 (90 Base)  MCG/ACT inhaler, INHALE 2 PUFFS BY MOUTH EVERY 6 HOURS AS NEEDED FOR WHEEZING FOR SHORTNESS OF BREATH, Disp: 24 g, Rfl: 0   alendronate (FOSAMAX) 70 MG tablet, Take 1 tablet (70 mg total) by mouth every Tuesday. Take with a full glass of water on an empty stomach., Disp: 12 tablet, Rfl: 3   ezetimibe (ZETIA) 10 MG tablet, Take 10 mg by mouth daily., Disp: , Rfl:    fenofibrate 160 MG tablet, TAKE 1 TABLET BY MOUTH  DAILY, Disp: 90 tablet, Rfl: 0   folic acid (FOLVITE) 1 MG tablet, Take 1 mg by mouth daily., Disp: , Rfl:    HYDROcodone-acetaminophen (NORCO) 10-325 MG tablet, Take 1 tablet by mouth every 6 (six) hours as needed for moderate pain., Disp: , Rfl:    ibuprofen (ADVIL) 200 MG tablet, Take 200 mg by mouth every 6 (six) hours as needed., Disp: , Rfl:    levothyroxine (SYNTHROID) 50 MCG tablet, TAKE 1 TABLET BY MOUTH  DAILY BEFORE BREAKFAST, Disp: 90 tablet, Rfl: 3   lisinopril (ZESTRIL) 10 MG tablet, TAKE 1 TABLET BY MOUTH  DAILY, Disp: 90 tablet, Rfl: 0   methocarbamol (ROBAXIN) 750 MG tablet, Take 2 tablets (1,500 mg total) by mouth 3 (three) times daily., Disp: 90 tablet, Rfl: 1   Methotrexate 25 MG/ML SOSY, INJECT 0.5 ML  SUBCUTANEOUSLY EVERY 7 DAYS (DISCARD UNUSED AFTER FIRST USE), Disp: , Rfl:    ondansetron (ZOFRAN-ODT) 4 MG disintegrating tablet, Take 4 mg by mouth every 6 (six) hours as needed., Disp: , Rfl:    pantoprazole (PROTONIX) 40 MG tablet, TAKE 1 TABLET BY MOUTH IN  THE EVENING, Disp: 90 tablet, Rfl: 3   sertraline (ZOLOFT) 100 MG tablet, TAKE 1 TABLET BY MOUTH  DAILY, Disp: 90 tablet, Rfl: 0   zolpidem (AMBIEN) 10 MG tablet, TAKE 1 TABLET BY MOUTH AT  BEDTIME AS NEEDED, Disp: 30 tablet, Rfl: 0   Allergies  Allergen Reactions   Other Itching    blisters   Tape Other (See Comments) and Itching    blisters   Crestor [Rosuvastatin] Other (See Comments)    MYALGIAS   Lipitor [Atorvastatin] Other (See Comments)    MYALGIAS   Percocet [Oxycodone-Acetaminophen] Itching  and Nausea Only   CONSTITUTIONAL: see HPI E/N/T: see HPI CARDIOVASCULAR: Negative for chest pain, dizziness, palpitations and pedal edema.  RESPIRATORY: see HPI GASTROINTESTINAL: Negative for abdominal pain, acid reflux symptoms, constipation, diarrhea, nausea and vomiting.  MSK: Negative for arthralgias and myalgias.  INTEGUMENTARY: Negative for rash.  NEUROLOGICAL: Negative for dizziness and headaches.  PSYCHIATRIC: Negative for sleep disturbance and to question depression screen.  Negative for depression, negative for anhedonia.  Objective:    PHYSICAL EXAM:   VS: BP 136/72    Pulse 87    Temp 98.4 F (36.9 C)    Resp 18    Ht $R'5\' 2"'UX$  (1.575 m)    Wt 166 lb (75.3 kg)    LMP 10/09/1997 (Within Months) Comment: hysterectomy 1999   SpO2 99%    BMI 30.36 kg/m   GEN: Well nourished, well developed, in no acute distress  HEENT: normal external ears and nose - normal external auditory canals and TMS -  - Lips, Teeth and Gums - normal  Oropharynx - erythema/pnd Cardiac: RRR; no murmurs, rubs, or gallops,no edema - Respiratory:  faint exp rhonchi - clears with cough Skin: warm and dry, no rash  Psych: euthymic mood, appropriate affect and demeanor  Office Visit on 10/11/2021  Component Date Value Ref Range Status   SARS Coronavirus 2 Ag 10/11/2021 Negative  Negative Final    Health Maintenance Due  Topic Date Due   Zoster Vaccines- Shingrix (1 of 2) Never done   PAP SMEAR-Modifier  Never done   COLONOSCOPY (Pts 45-35yrs Insurance coverage will need to be confirmed)  Never done   COVID-19 Vaccine (4 - Booster for Moderna series) 07/22/2020   MAMMOGRAM  01/25/2021    There are no preventive care reminders to display for this patient.  Lab Results  Component Value Date   TSH 0.971 07/14/2020   Lab Results  Component Value Date   WBC 7.6 12/28/2020   HGB 12.4 12/28/2020   HCT 37.8 12/28/2020   MCV 92 12/28/2020   PLT 398 12/28/2020   Lab Results  Component  Value Date   NA 141 07/14/2020   K 4.2 07/14/2020   CO2 19 (L) 07/14/2020   GLUCOSE 103 (H) 07/14/2020   BUN 9 07/14/2020   CREATININE 1.05 (H) 12/28/2020   BILITOT 0.2 12/28/2020   ALKPHOS 54 12/28/2020   AST 14 12/28/2020   ALT 19 12/28/2020   PROT 7.4 12/28/2020   ALBUMIN 4.9 12/28/2020   CALCIUM 8.9 07/14/2020   ANIONGAP 7 03/11/2019   EGFR 64 12/28/2020   Lab Results  Component Value Date   CHOL 219 (H) 07/14/2020   Lab Results  Component Value Date   HDL 39 (L) 07/14/2020   Lab Results  Component Value Date   LDLCALC 128 (H) 07/14/2020   Lab Results  Component Value Date   TRIG 293 (H) 07/14/2020   Lab Results  Component Value Date   CHOLHDL 5.6 (H) 07/14/2020   No results found for: HGBA1C    Assessment & Plan:   Problem List Items Addressed This Visit       Cardiovascular and Mediastinum   Benign hypertension - Primary   Relevant Orders   CBC with Differential/Platelet   Comprehensive metabolic panel Continue current meds as directed     Respiratory   Acute laryngopharyngitis   Relevant Orders   POC COVID-19 (Completed) Rx for keflex     Endocrine   Other specified hypothyroidism   Relevant Orders   TSH     Musculoskeletal and Integument   Osteoporosis   Relevant Orders   VITAMIN D 25 Hydroxy (Vit-D Deficiency, Fractures)     Other   Dyslipidemia   Relevant Orders   Lipid panel  GERD Continue meds as directed    Meds ordered this encounter  Medications   cephALEXin (KEFLEX) 500 MG capsule    Sig: Take 1 capsule (500 mg total) by mouth 2 (two)  times daily.    Dispense:  20 capsule    Refill:  0    Order Specific Question:   Supervising Provider    Answer:   COX, Lynder Parents   albuterol (VENTOLIN HFA) 108 (90 Base) MCG/ACT inhaler    Sig: INHALE 2 PUFFS BY MOUTH EVERY 6 HOURS AS NEEDED FOR WHEEZING FOR SHORTNESS OF BREATH    Dispense:  24 g    Refill:  0    Order Specific Question:   Supervising Provider    AnswerShelton Silvas    Follow-up: Return in about 6 months (around 04/10/2022) for chronic fasting follow up.    SARA R Prisilla Kocsis, PA-C

## 2021-10-12 ENCOUNTER — Telehealth: Payer: Self-pay | Admitting: Physician Assistant

## 2021-10-12 LAB — CBC WITH DIFFERENTIAL/PLATELET
Basophils Absolute: 0.1 10*3/uL (ref 0.0–0.2)
Basos: 1 %
EOS (ABSOLUTE): 0.4 10*3/uL (ref 0.0–0.4)
Eos: 5 %
Hematocrit: 36.4 % (ref 34.0–46.6)
Hemoglobin: 12.1 g/dL (ref 11.1–15.9)
Immature Grans (Abs): 0 10*3/uL (ref 0.0–0.1)
Immature Granulocytes: 0 %
Lymphocytes Absolute: 1.9 10*3/uL (ref 0.7–3.1)
Lymphs: 24 %
MCH: 30.8 pg (ref 26.6–33.0)
MCHC: 33.2 g/dL (ref 31.5–35.7)
MCV: 93 fL (ref 79–97)
Monocytes Absolute: 0.7 10*3/uL (ref 0.1–0.9)
Monocytes: 9 %
Neutrophils Absolute: 4.7 10*3/uL (ref 1.4–7.0)
Neutrophils: 61 %
Platelets: 361 10*3/uL (ref 150–450)
RBC: 3.93 x10E6/uL (ref 3.77–5.28)
RDW: 15.8 % — ABNORMAL HIGH (ref 11.7–15.4)
WBC: 7.7 10*3/uL (ref 3.4–10.8)

## 2021-10-12 LAB — LIPID PANEL
Chol/HDL Ratio: 3.8 ratio (ref 0.0–4.4)
Cholesterol, Total: 160 mg/dL (ref 100–199)
HDL: 42 mg/dL (ref >39)
LDL Chol Calc (NIH): 90 mg/dL (ref 0–99)
Triglycerides: 159 mg/dL — ABNORMAL HIGH (ref 0–149)
VLDL Cholesterol Cal: 28 mg/dL (ref 5–40)

## 2021-10-12 LAB — COMPREHENSIVE METABOLIC PANEL
ALT: 18 IU/L (ref 0–32)
AST: 18 IU/L (ref 0–40)
Albumin/Globulin Ratio: 2 (ref 1.2–2.2)
Albumin: 4.7 g/dL (ref 3.8–4.9)
Alkaline Phosphatase: 65 IU/L (ref 44–121)
BUN/Creatinine Ratio: 14 (ref 9–23)
BUN: 15 mg/dL (ref 6–24)
Bilirubin Total: <0.2 mg/dL (ref 0.0–1.2)
CO2: 24 mmol/L (ref 20–29)
Calcium: 9.8 mg/dL (ref 8.7–10.2)
Chloride: 104 mmol/L (ref 96–106)
Creatinine, Ser: 1.1 mg/dL — ABNORMAL HIGH (ref 0.57–1.00)
Globulin, Total: 2.3 g/dL (ref 1.5–4.5)
Glucose: 87 mg/dL (ref 70–99)
Potassium: 4.5 mmol/L (ref 3.5–5.2)
Sodium: 143 mmol/L (ref 134–144)
Total Protein: 7 g/dL (ref 6.0–8.5)
eGFR: 61 mL/min/{1.73_m2} (ref >59)

## 2021-10-12 LAB — TSH: TSH: 0.847 u[IU]/mL (ref 0.450–4.500)

## 2021-10-12 LAB — VITAMIN D 25 HYDROXY (VIT D DEFICIENCY, FRACTURES): Vit D, 25-Hydroxy: 34.2 ng/mL (ref 30.0–100.0)

## 2021-10-12 LAB — CARDIOVASCULAR RISK ASSESSMENT

## 2021-10-12 NOTE — Telephone Encounter (Signed)
° °  Bonnie Parker has been scheduled for the following appointment:  WHAT: SCREENING MAMMOGRAM WHERE: [facility] DATE: 10/26/21 TIME: 5:45 pm ARRIVAL TIME   A message has been left for the patient.

## 2021-10-17 ENCOUNTER — Telehealth: Payer: Self-pay

## 2021-10-17 MED ORDER — BENZONATATE 200 MG PO CAPS: 200.0000 mg | ORAL_CAPSULE | Freq: Three times a day (TID) | ORAL | 0 refills | Status: AC | PRN

## 2021-10-17 NOTE — Telephone Encounter (Signed)
Bonnie Parker called to report that she was seen last week by S. Rosana Hoes, [place].  She is improving but she continues to have cough and some hoarseness.  She is finding it very hard to find Robitussin for her cough and she called requesting that a prescription for cough be sent to [facility] in [place].  Symptoms reviewed with Dr. Tobie Poet.  Lewayne Bunting sent to pharmacy.

## 2021-10-18 ENCOUNTER — Other Ambulatory Visit: Payer: Self-pay | Admitting: Physician Assistant

## 2021-10-18 MED ORDER — ALBUTEROL SULFATE HFA 108 (90 BASE) MCG/ACT IN AERS: 2.0000 | INHALATION_SPRAY | Freq: Four times a day (QID) | RESPIRATORY_TRACT | 0 refills | Status: DC | PRN

## 2021-10-27 ENCOUNTER — Telehealth: Payer: Self-pay

## 2021-10-27 NOTE — Telephone Encounter (Signed)
Patient's insurance will no longer cover the Ventolin HFA inhailer. So insurance was requesting if we could change the RX to regular ProAir HFA inhailer and per Marge Duncans it is ok, called pharmacy and made the switch.

## 2021-11-07 NOTE — Telephone Encounter (Signed)
Pt no showed mammogram on 10/26/21

## 2021-11-09 ENCOUNTER — Other Ambulatory Visit: Payer: Self-pay | Admitting: Physician Assistant

## 2021-11-09 DIAGNOSIS — M5136 Other intervertebral disc degeneration, lumbar region: Secondary | ICD-10-CM

## 2021-11-16 ENCOUNTER — Other Ambulatory Visit: Payer: Self-pay | Admitting: Physician Assistant

## 2021-11-16 DIAGNOSIS — I10 Essential (primary) hypertension: Secondary | ICD-10-CM

## 2021-11-21 DIAGNOSIS — Z1211 Encounter for screening for malignant neoplasm of colon: Secondary | ICD-10-CM | POA: Diagnosis not present

## 2021-11-22 ENCOUNTER — Other Ambulatory Visit: Payer: Self-pay | Admitting: Physician Assistant

## 2021-11-28 ENCOUNTER — Encounter: Payer: Self-pay | Admitting: Physician Assistant

## 2021-11-28 ENCOUNTER — Ambulatory Visit: Payer: BC Managed Care – PPO | Admitting: Physician Assistant

## 2021-11-28 ENCOUNTER — Other Ambulatory Visit: Payer: Self-pay

## 2021-11-28 VITALS — BP 130/74 | HR 91 | Temp 97.3°F | Ht 62.0 in | Wt 165.0 lb

## 2021-11-28 DIAGNOSIS — Z8709 Personal history of other diseases of the respiratory system: Secondary | ICD-10-CM | POA: Diagnosis not present

## 2021-11-28 DIAGNOSIS — R053 Chronic cough: Secondary | ICD-10-CM

## 2021-11-28 MED ORDER — BUDESONIDE-FORMOTEROL FUMARATE 160-4.5 MCG/ACT IN AERO: 2.0000 | INHALATION_SPRAY | Freq: Two times a day (BID) | RESPIRATORY_TRACT | 3 refills | Status: DC

## 2021-11-28 MED ORDER — MONTELUKAST SODIUM 10 MG PO TABS: 10.0000 mg | ORAL_TABLET | Freq: Every day | ORAL | 3 refills | Status: DC

## 2021-11-28 NOTE — Telephone Encounter (Signed)
Notify pt that the albuterol for the nebulizer is the same medication in the inhaler so it would not necessarily make a difference --- we have not seen her for these symptoms so recommend to make appt

## 2021-11-28 NOTE — Progress Notes (Signed)
Acute Office Visit  Subjective:    Patient ID: Bonnie Parker, female    DOB: 1970/10/19, 51 y.o.   MRN: [id number]  Chief Complaint  Patient presents with   Cough    X 1 month or a little more. Has been taking tessalon pearls and her inhaler. Sometimes coughs so much she start gaging. Cough is dry.    HPI: Patient is in today for complaints of chronic cough that has been going on intermittently several months.  She is wheezing at times as well.  Denies productive cough.  Does have mild allergy symptoms.  Uses her albuterol inhaler regularly which helps her cough.  She has had a normal chest xray within the year  Past Medical History:  Diagnosis Date   Age-related osteoporosis without current pathological fracture    Anxiety    Arthritis    Asthma    Atrophy of thyroid (acquired)    Depression    Drug-induced myopathy    Gastro-esophageal reflux disease without esophagitis    Hypothyroidism    Lumbar spinal stenosis    Mixed hyperlipidemia    Polyarthralgia    Sarcoidosis of other sites     Past Surgical History:  Procedure Laterality Date   ABDOMINAL HYSTERECTOMY  1999   BACK SURGERY  2005   Dr. Marcha Dutton EAR SURGERY     LUMBAR LAMINECTOMY/DECOMPRESSION MICRODISCECTOMY N/A 06/21/2017   Procedure: Lumbar decompression L3-4 ;  Surgeon: Melina Schools, MD;  Location: [facility];  Service: Orthopedics;  Laterality: N/A;  3 hrs   SPINAL CORD STIMULATOR INSERTION N/A 03/13/2019   Procedure: LUMBAR SPINAL CORD STIMULATOR INSERTION;  Surgeon: Melina Schools, MD;  Location: [facility];  Service: Orthopedics;  Laterality: N/A;   TONSILLECTOMY      Family History  Problem Relation Age of Onset   Hypertension Mother    Cancer - Lung Father     Social History   Socioeconomic History   Marital status: Married    Spouse name: Not on file   Number of children: 4   Years of education: Not on file   Highest education level: Not on file  Occupational History   Not on file   Tobacco Use   Smoking status: Never   Smokeless tobacco: Never  Vaping Use   Vaping Use: Never used  Substance and Sexual Activity   Alcohol use: No   Drug use: No   Sexual activity: Not on file  Other Topics Concern   Not on file  Social History Narrative   Not on file   Social Determinants of Health   Financial Resource Strain: Not on file  Food Insecurity: Not on file  Transportation Needs: Not on file  Physical Activity: Not on file  Stress: Not on file  Social Connections: Not on file  Intimate Partner Violence: Not on file    Outpatient Medications Prior to Visit  Medication Sig Dispense Refill   buPROPion (WELLBUTRIN XL) 300 MG 24 hr tablet TAKE 1 TABLET BY MOUTH  DAILY 90 tablet 0   acetaminophen (TYLENOL) 500 MG tablet Take 1,000 mg by mouth every 6 (six) hours as needed for moderate pain or headache.     Albuterol Sulfate (PROAIR HFA IN) Inhale 2 puffs into the lungs every 6 (six) hours as needed.     alendronate (FOSAMAX) 70 MG tablet TAKE 1 TABLET BY MOUTH  EVERY TUESDAY TAKE WITH A  FULL GLASS OF WATER ON AN  EMPTY STOMACH 12 tablet  3   ezetimibe (ZETIA) 10 MG tablet Take 10 mg by mouth daily.     fenofibrate 160 MG tablet TAKE 1 TABLET BY MOUTH DAILY 90 tablet 3   folic acid (FOLVITE) 1 MG tablet Take 1 mg by mouth daily.     HYDROcodone-acetaminophen (NORCO) 10-325 MG tablet Take 1 tablet by mouth every 6 (six) hours as needed for moderate pain.     ibuprofen (ADVIL) 200 MG tablet Take 200 mg by mouth every 6 (six) hours as needed.     levothyroxine (SYNTHROID) 50 MCG tablet TAKE 1 TABLET BY MOUTH  DAILY BEFORE BREAKFAST 90 tablet 3   lisinopril (ZESTRIL) 10 MG tablet TAKE 1 TABLET BY MOUTH DAILY 90 tablet 3   methocarbamol (ROBAXIN) 750 MG tablet Take 2 tablets (1,500 mg total) by mouth 3 (three) times daily. 90 tablet 1   Methotrexate 25 MG/ML SOSY INJECT 0.5 ML  SUBCUTANEOUSLY EVERY 7 DAYS (DISCARD UNUSED AFTER FIRST USE)     ondansetron (ZOFRAN-ODT) 4 MG  disintegrating tablet Take 4 mg by mouth every 6 (six) hours as needed.     pantoprazole (PROTONIX) 40 MG tablet TAKE 1 TABLET BY MOUTH IN  THE EVENING 90 tablet 3   sertraline (ZOLOFT) 100 MG tablet TAKE 1 TABLET BY MOUTH  DAILY 90 tablet 0   zolpidem (AMBIEN) 10 MG tablet TAKE 1 TABLET BY MOUTH AT  BEDTIME AS NEEDED 30 tablet 0   cephALEXin (KEFLEX) 500 MG capsule Take 1 capsule (500 mg total) by mouth 2 (two) times daily. 20 capsule 0   No facility-administered medications prior to visit.    Allergies  Allergen Reactions   Other Itching    blisters   Tape Other (See Comments) and Itching    blisters   Crestor [Rosuvastatin] Other (See Comments)    MYALGIAS   Lipitor [Atorvastatin] Other (See Comments)    MYALGIAS   Percocet [Oxycodone-Acetaminophen] Itching and Nausea Only    Review of Systems    CONSTITUTIONAL: Negative for chills, fatigue, fever, unintentional weight gain and unintentional weight loss.  E/N/T: Negative for ear pain, nasal congestion and sore throat.  CARDIOVASCULAR: Negative for chest pain, dizziness, palpitations and pedal edema.  RESPIRATORY: see HPI GASTROINTESTINAL: Negative for abdominal pain, acid reflux symptoms, constipation, diarrhea, nausea and vomiting.  INTEGUMENTARY: Negative for rash.    Objective:   PHYSICAL EXAM:   VS: BP 130/74    Pulse 91    Temp (!) 97.3 F (36.3 C)    Ht $R'5\' 2"'zh$  (1.575 m)    Wt 165 lb (74.8 kg)    LMP 10/09/1997 (Within Months) Comment: hysterectomy 1999   SpO2 98%    BMI 30.18 kg/m   GEN: Well nourished, well developed, in no acute distress  HEENT: normal external ears and nose - normal external auditory canals and TMS -  - Lips, Teeth and Gums - normal  Oropharynx - normal mucosa, palate, and posterior pharynx Cardiac: RRR; no murmurs, Respiratory:  normal respiratory rate and pattern with no distress - normal breath sounds with no rales, rhonchi, wheezes or rubs    Health Maintenance Due  Topic Date Due    MAMMOGRAM  01/25/2021    There are no preventive care reminders to display for this patient.   Lab Results  Component Value Date   TSH 0.847 10/11/2021   Lab Results  Component Value Date   WBC 7.7 10/11/2021   HGB 12.1 10/11/2021   HCT 36.4 10/11/2021   MCV 93  10/11/2021   PLT 361 10/11/2021   Lab Results  Component Value Date   NA 143 10/11/2021   K 4.5 10/11/2021   CO2 24 10/11/2021   GLUCOSE 87 10/11/2021   BUN 15 10/11/2021   CREATININE 1.10 (H) 10/11/2021   BILITOT <0.2 10/11/2021   ALKPHOS 65 10/11/2021   AST 18 10/11/2021   ALT 18 10/11/2021   PROT 7.0 10/11/2021   ALBUMIN 4.7 10/11/2021   CALCIUM 9.8 10/11/2021   ANIONGAP 7 03/11/2019   EGFR 61 10/11/2021   Lab Results  Component Value Date   CHOL 160 10/11/2021   Lab Results  Component Value Date   HDL 42 10/11/2021   Lab Results  Component Value Date   LDLCALC 90 10/11/2021   Lab Results  Component Value Date   TRIG 159 (H) 10/11/2021   Lab Results  Component Value Date   CHOLHDL 3.8 10/11/2021   No results found for: HGBA1C     Assessment & Plan:   Problem List Items Addressed This Visit   None Visit Diagnoses     Chronic cough    -  Primary   Relevant Medications   montelukast (SINGULAIR) 10 MG tablet   History of asthma       Relevant Medications   budesonide-formoterol (SYMBICORT) 160-4.5 MCG/ACT inhaler      Meds ordered this encounter  Medications   montelukast (SINGULAIR) 10 MG tablet    Sig: Take 1 tablet (10 mg total) by mouth at bedtime.    Dispense:  30 tablet    Refill:  3    Order Specific Question:   Supervising Provider    Answer:   Shelton Silvas   budesonide-formoterol (SYMBICORT) 160-4.5 MCG/ACT inhaler    Sig: Inhale 2 puffs into the lungs 2 (two) times daily.    Dispense:  1 each    Refill:  3    Order Specific Question:   Supervising Provider    Answer:   Shelton Silvas    No orders of the defined types were placed in this  encounter.    Follow-up: Return if symptoms worsen or fail to improve.  An After Visit Summary was printed and given to the patient.  Yetta Flock [facility] [phone]

## 2021-12-06 DIAGNOSIS — H30033 Focal chorioretinal inflammation, peripheral, bilateral: Secondary | ICD-10-CM | POA: Diagnosis not present

## 2021-12-06 DIAGNOSIS — H2513 Age-related nuclear cataract, bilateral: Secondary | ICD-10-CM | POA: Diagnosis not present

## 2021-12-13 ENCOUNTER — Other Ambulatory Visit: Payer: Self-pay | Admitting: Physician Assistant

## 2022-01-06 ENCOUNTER — Other Ambulatory Visit: Payer: Self-pay | Admitting: Physician Assistant

## 2022-01-06 DIAGNOSIS — J060 Acute laryngopharyngitis: Secondary | ICD-10-CM

## 2022-01-06 MED ORDER — ALBUTEROL SULFATE HFA 108 (90 BASE) MCG/ACT IN AERS: 1.0000 | INHALATION_SPRAY | Freq: Four times a day (QID) | RESPIRATORY_TRACT | 5 refills | Status: AC | PRN

## 2022-01-29 ENCOUNTER — Other Ambulatory Visit: Payer: Self-pay | Admitting: Physician Assistant

## 2022-03-01 DIAGNOSIS — H30033 Focal chorioretinal inflammation, peripheral, bilateral: Secondary | ICD-10-CM | POA: Diagnosis not present

## 2022-03-01 DIAGNOSIS — Z79899 Other long term (current) drug therapy: Secondary | ICD-10-CM | POA: Diagnosis not present

## 2022-03-01 DIAGNOSIS — M8000XA Age-related osteoporosis with current pathological fracture, unspecified site, initial encounter for fracture: Secondary | ICD-10-CM | POA: Diagnosis not present

## 2022-03-01 DIAGNOSIS — D869 Sarcoidosis, unspecified: Secondary | ICD-10-CM | POA: Diagnosis not present

## 2022-04-14 ENCOUNTER — Other Ambulatory Visit: Payer: Self-pay | Admitting: Physician Assistant

## 2022-04-17 ENCOUNTER — Encounter: Payer: BC Managed Care – PPO | Admitting: Physician Assistant

## 2022-05-31 ENCOUNTER — Other Ambulatory Visit: Payer: Self-pay | Admitting: Physician Assistant

## 2022-08-06 ENCOUNTER — Other Ambulatory Visit: Payer: Self-pay | Admitting: Physician Assistant

## 2022-08-13 ENCOUNTER — Other Ambulatory Visit: Payer: Self-pay | Admitting: Physician Assistant

## 2022-08-27 ENCOUNTER — Other Ambulatory Visit: Payer: Self-pay | Admitting: Physician Assistant

## 2022-08-27 DIAGNOSIS — I10 Essential (primary) hypertension: Secondary | ICD-10-CM

## 2022-08-30 ENCOUNTER — Encounter: Payer: Self-pay | Admitting: Physician Assistant

## 2022-08-30 ENCOUNTER — Encounter: Payer: BC Managed Care – PPO | Admitting: Physician Assistant

## 2022-09-01 ENCOUNTER — Other Ambulatory Visit: Payer: Self-pay | Admitting: Physician Assistant

## 2022-09-07 ENCOUNTER — Encounter: Payer: Self-pay | Admitting: Physician Assistant

## 2022-09-07 ENCOUNTER — Ambulatory Visit: Payer: BC Managed Care – PPO | Admitting: Physician Assistant

## 2022-09-07 VITALS — BP 132/86 | HR 84 | Temp 97.2°F | Ht 62.0 in | Wt 169.8 lb

## 2022-09-07 DIAGNOSIS — M818 Other osteoporosis without current pathological fracture: Secondary | ICD-10-CM

## 2022-09-07 DIAGNOSIS — E038 Other specified hypothyroidism: Secondary | ICD-10-CM | POA: Diagnosis not present

## 2022-09-07 DIAGNOSIS — I10 Essential (primary) hypertension: Secondary | ICD-10-CM | POA: Diagnosis not present

## 2022-09-07 DIAGNOSIS — E785 Hyperlipidemia, unspecified: Secondary | ICD-10-CM | POA: Diagnosis not present

## 2022-09-07 DIAGNOSIS — Z1231 Encounter for screening mammogram for malignant neoplasm of breast: Secondary | ICD-10-CM

## 2022-09-07 DIAGNOSIS — M25551 Pain in right hip: Secondary | ICD-10-CM

## 2022-09-07 DIAGNOSIS — E559 Vitamin D deficiency, unspecified: Secondary | ICD-10-CM

## 2022-09-07 DIAGNOSIS — F331 Major depressive disorder, recurrent, moderate: Secondary | ICD-10-CM

## 2022-09-07 DIAGNOSIS — Z23 Encounter for immunization: Secondary | ICD-10-CM

## 2022-09-07 DIAGNOSIS — M0579 Rheumatoid arthritis with rheumatoid factor of multiple sites without organ or systems involvement: Secondary | ICD-10-CM

## 2022-09-07 MED ORDER — SERTRALINE HCL 100 MG PO TABS: 100.0000 mg | ORAL_TABLET | Freq: Every day | ORAL | 1 refills | Status: DC

## 2022-09-07 MED ORDER — METHOCARBAMOL 750 MG PO TABS: 1500.0000 mg | ORAL_TABLET | Freq: Three times a day (TID) | ORAL | 1 refills | Status: DC

## 2022-09-07 NOTE — Progress Notes (Signed)
Established Patient Office Visit  Subjective:  Patient ID: Bonnie Parker, female    DOB: 1971-09-30  Age: 51 y.o. MRN: [id number]  CC:  Chief Complaint  Patient presents with   Hyperlipidemia   Hypertension   Cough    X2 weeks    HPI Bonnie Parker presents for hyperlipidemia  Mixed hyperlipidemia  Pt presents with hyperlipidemia. . Compliance with treatment has been good The patient is compliant with medications, maintains a low cholesterol diet , follows up as directed , and maintains an exercise regimen . The patient denies experiencing any hypercholesterolemia related symptoms. Pt currently on Zetia 41m qd and fenofibrate 1666m Pt with history of sarcoidosis - she sees specialist Dr GoFestus Holtsn [place] also eye specialist Dr ShManuella Ghazi currently on methotrexate weekly for management - states she is due for follow up appt in January  Pt with history of anxiety and depression - she is currently taking zoloft 10065md and wellburtin XL 300m50m - states symptoms are stable at this time  Pt with history of osteoporosis- currently taking fosamax 70mg67mkly - is due for dexa scan  Pt states she is no longer taking vit D supplements - she states rheumatologist told her to stop  Pt with history of hypothyroidism- is taking 50mcg66m- voices no problems or concerns  Pt is due to schedule mammogram - missed prior appt  Pt with history of GERD - stable on protonix 40mg q83mt presents for follow up of hypertension. The patient is tolerating the medication well without side effects. Compliance with treatment has been good; including taking medication as directed , maintains a healthy diet and regular exercise regimen , and following up as directed. Currently taking zestril 10mg qd4m would like COVID booster and flu shot  Past Medical History:  Diagnosis Date   Age-related osteoporosis without current pathological fracture    Anxiety    Arthritis    Asthma    Atrophy  of thyroid (acquired)    Depression    Drug-induced myopathy    Gastro-esophageal reflux disease without esophagitis    Hypothyroidism    Lumbar spinal stenosis    Mixed hyperlipidemia    Polyarthralgia    Sarcoidosis of other sites     Past Surgical History:  Procedure Laterality Date   ABDOMINAL HYSTERECTOMY  1999   BACK SURGERY  2005   Dr. Jones   Marcha DuttonGERY     LUMBAR LAMINECTOMY/DECOMPRESSION MICRODISCECTOMY N/A 06/21/2017   Procedure: Lumbar decompression L3-4 ;  Surgeon: Brooks, Melina Schoolsocation: [facility] OR;  [place]: Orthopedics;  Laterality: N/A;  3 hrs   SPINAL CORD STIMULATOR INSERTION N/A 03/13/2019   Procedure: LUMBAR SPINAL CORD STIMULATOR INSERTION;  Surgeon: Brooks, Melina Schoolsocation: [facility] OR;  [place]: Orthopedics;  Laterality: N/A;   TONSILLECTOMY      Family History  Problem Relation Age of Onset   Hypertension Mother    Cancer - Lung Father     Social History   Socioeconomic History   Marital status: Married    Spouse name: Not on file   Number of children: 4   Years of education: Not on file   Highest education level: Not on file  Occupational History   Not on file  Tobacco Use   Smoking status: Never   Smokeless tobacco: Never  Vaping Use   Vaping Use: Never used  Substance and Sexual Activity   Alcohol use: No  Drug use: No   Sexual activity: Not on file  Other Topics Concern   Not on file  Social History Narrative   Not on file   Social Determinants of Health   Financial Resource Strain: Not on file  Food Insecurity: Not on file  Transportation Needs: Not on file  Physical Activity: Not on file  Stress: Not on file  Social Connections: Not on file  Intimate Partner Violence: Not on file     Current Outpatient Medications:    acetaminophen (TYLENOL) 500 MG tablet, Take 1,000 mg by mouth every 6 (six) hours as needed for moderate pain or headache., Disp: , Rfl:    albuterol (PROAIR HFA) 108 (90 Base) MCG/ACT inhaler,  Inhale 1-2 puffs into the lungs every 6 (six) hours as needed., Disp: 1 each, Rfl: 5   alendronate (FOSAMAX) 70 MG tablet, TAKE 1 TABLET BY MOUTH  EVERY TUESDAY TAKE WITH A  FULL GLASS OF WATER ON AN  EMPTY STOMACH, Disp: 12 tablet, Rfl: 3   buPROPion (WELLBUTRIN XL) 300 MG 24 hr tablet, TAKE 1 TABLET BY MOUTH DAILY, Disp: 90 tablet, Rfl: 1   ezetimibe (ZETIA) 10 MG tablet, Take 10 mg by mouth daily., Disp: , Rfl:    fenofibrate 160 MG tablet, TAKE 1 TABLET BY MOUTH DAILY, Disp: 90 tablet, Rfl: 3   folic acid (FOLVITE) 1 MG tablet, Take 1 mg by mouth daily., Disp: , Rfl:    ibuprofen (ADVIL) 200 MG tablet, Take 200 mg by mouth every 6 (six) hours as needed., Disp: , Rfl:    levothyroxine (SYNTHROID) 50 MCG tablet, TAKE 1 TABLET BY MOUTH  DAILY BEFORE BREAKFAST, Disp: 90 tablet, Rfl: 3   lisinopril (ZESTRIL) 10 MG tablet, TAKE 1 TABLET BY MOUTH DAILY, Disp: 90 tablet, Rfl: 3   Methotrexate 25 MG/ML SOSY, INJECT 0.5 ML  SUBCUTANEOUSLY EVERY 7 DAYS (DISCARD UNUSED AFTER FIRST USE), Disp: , Rfl:    pantoprazole (PROTONIX) 40 MG tablet, TAKE 1 TABLET BY MOUTH IN  THE EVENING, Disp: 90 tablet, Rfl: 3   methocarbamol (ROBAXIN) 750 MG tablet, Take 2 tablets (1,500 mg total) by mouth 3 (three) times daily., Disp: 90 tablet, Rfl: 1   sertraline (ZOLOFT) 100 MG tablet, Take 1 tablet (100 mg total) by mouth daily., Disp: 30 tablet, Rfl: 1   Allergies  Allergen Reactions   Other Itching    blisters   Tape Other (See Comments) and Itching    blisters   Crestor [Rosuvastatin] Other (See Comments)    MYALGIAS   Lipitor [Atorvastatin] Other (See Comments)    MYALGIAS   Percocet [Oxycodone-Acetaminophen] Itching and Nausea Only   CONSTITUTIONAL: Negative for chills, fatigue, fever, unintentional weight gain and unintentional weight loss.  E/N/T: Negative for ear pain, nasal congestion and sore throat.  CARDIOVASCULAR: Negative for chest pain, dizziness, palpitations and pedal edema.  RESPIRATORY:  Negative for recent cough and dyspnea.  GASTROINTESTINAL: Negative for abdominal pain, acid reflux symptoms, constipation, diarrhea, nausea and vomiting.  MSK: see HPI INTEGUMENTARY: Negative for rash.  NEUROLOGICAL: Negative for dizziness and headaches.  PSYCHIATRIC: Negative for sleep disturbance and to question depression screen.  Negative for depression, negative for anhedonia.        Objective:  PHYSICAL EXAM:   VS: BP 132/86 (BP Location: Left Arm, Patient Position: Sitting, Cuff Size: Large)   Pulse 84   Temp (!) 97.2 F (36.2 C) (Temporal)   Ht _0  (1.575 m)   Wt 169 lb 12.8 oz (77  kg)   LMP 10/09/1997 (Within Months) Comment: hysterectomy 1999  SpO2 97%   BMI 31.06 kg/m     09/07/2022   10:53 AM 10/18/2020    9:07 AM 10/18/2020    9:04 AM 01/01/2020    8:30 AM  Depression screen PHQ 2/9  Decreased Interest 1 0 0 0  Down, Depressed, Hopeless 0 0 0 1  PHQ - 2 Score 1 0 0 1  Altered sleeping 0 0  2  Tired, decreased energy 3 0  2  Change in appetite 0 0  0  Feeling bad or failure about yourself  0 0  0  Trouble concentrating 0 0  0  Moving slowly or fidgety/restless 0 0  0  Suicidal thoughts 0 0  0  PHQ-9 Score 4 0  5  Difficult doing work/chores Not difficult at all       GEN: Well nourished, well developed, in no acute distress   Cardiac: RRR; no murmurs, rubs, or gallops,no edema -  Respiratory:  normal respiratory rate and pattern with no distress - normal breath sounds with no rales, rhonchi, wheezes or rubs MS: no deformity or atrophy  Skin: warm and dry, no rash  Neuro:  Alert and Oriented x 3,- CN II-Xii grossly intact Psych: euthymic mood, appropriate affect and demeanor   No visits with results within 1 Day(s) from this visit.  Latest known visit with results is:  Office Visit on 10/11/2021  Component Date Value Ref Range Status   SARS Coronavirus 2 Ag 10/11/2021 Negative  Negative Final   WBC 10/11/2021 7.7  3.4 - 10.8 x10E3/uL Final   RBC  10/11/2021 3.93  3.77 - 5.28 x10E6/uL Final   Hemoglobin 10/11/2021 12.1  11.1 - 15.9 g/dL Final   Hematocrit 10/11/2021 36.4  34.0 - 46.6 % Final   MCV 10/11/2021 93  79 - 97 fL Final   MCH 10/11/2021 30.8  26.6 - 33.0 pg Final   MCHC 10/11/2021 33.2  31.5 - 35.7 g/dL Final   RDW 10/11/2021 15.8 (H)  11.7 - 15.4 % Final   Platelets 10/11/2021 361  150 - 450 x10E3/uL Final   Neutrophils 10/11/2021 61  Not Estab. % Final   Lymphs 10/11/2021 24  Not Estab. % Final   Monocytes 10/11/2021 9  Not Estab. % Final   Eos 10/11/2021 5  Not Estab. % Final   Basos 10/11/2021 1  Not Estab. % Final   Neutrophils Absolute 10/11/2021 4.7  1.4 - 7.0 x10E3/uL Final   Lymphocytes Absolute 10/11/2021 1.9  0.7 - 3.1 x10E3/uL Final   Monocytes Absolute 10/11/2021 0.7  0.1 - 0.9 x10E3/uL Final   EOS (ABSOLUTE) 10/11/2021 0.4  0.0 - 0.4 x10E3/uL Final   Basophils Absolute 10/11/2021 0.1  0.0 - 0.2 x10E3/uL Final   Immature Granulocytes 10/11/2021 0  Not Estab. % Final   Immature Grans (Abs) 10/11/2021 0.0  0.0 - 0.1 x10E3/uL Final   Glucose 10/11/2021 87  70 - 99 mg/dL Final   BUN 10/11/2021 15  6 - 24 mg/dL Final   Creatinine, Ser 10/11/2021 1.10 (H)  0.57 - 1.00 mg/dL Final   eGFR 10/11/2021 61  >59 mL/min/1.73 Final   BUN/Creatinine Ratio 10/11/2021 14  9 - 23 Final   Sodium 10/11/2021 143  134 - 144 mmol/L Final   Potassium 10/11/2021 4.5  3.5 - 5.2 mmol/L Final   Chloride 10/11/2021 104  96 - 106 mmol/L Final   CO2 10/11/2021 24  20 - 29  mmol/L Final   Calcium 10/11/2021 9.8  8.7 - 10.2 mg/dL Final   Total Protein 10/11/2021 7.0  6.0 - 8.5 g/dL Final   Albumin 10/11/2021 4.7  3.8 - 4.9 g/dL Final   Globulin, Total 10/11/2021 2.3  1.5 - 4.5 g/dL Final   Albumin/Globulin Ratio 10/11/2021 2.0  1.2 - 2.2 Final   Bilirubin Total 10/11/2021 <0.2  0.0 - 1.2 mg/dL Final   Alkaline Phosphatase 10/11/2021 65  44 - 121 IU/L Final   AST 10/11/2021 18  0 - 40 IU/L Final   ALT 10/11/2021 18  0 - 32 IU/L Final    TSH 10/11/2021 0.847  0.450 - 4.500 uIU/mL Final   Cholesterol, Total 10/11/2021 160  100 - 199 mg/dL Final   Triglycerides 10/11/2021 159 (H)  0 - 149 mg/dL Final   HDL 10/11/2021 42  >39 mg/dL Final   VLDL Cholesterol Cal 10/11/2021 28  5 - 40 mg/dL Final   LDL Chol Calc (NIH) 10/11/2021 90  0 - 99 mg/dL Final   Chol/HDL Ratio 10/11/2021 3.8  0.0 - 4.4 ratio Final   Comment:                                   T. Chol/HDL Ratio                                             Men  Women                               1/2 Avg.Risk  3.4    3.3                                   Avg.Risk  5.0    4.4                                2X Avg.Risk  9.6    7.1                                3X Avg.Risk 23.4   11.0    Vit D, 25-Hydroxy 10/11/2021 34.2  30.0 - 100.0 ng/mL Final   Comment: Vitamin D deficiency has been defined by the [place] practice guideline as a level of serum 25-OH vitamin D less than 20 ng/mL (1,2). The Endocrine Society went on to further define vitamin D insufficiency as a level between 21 and 29 ng/mL (2). 1. IOM (Institute of Medicine). 2010. Dietary reference    intakes for calcium and D. [place]: The    [employer]. 2. Holick MF, Binkley [place], Bischoff-Ferrari HA, et al.    Evaluation, treatment, and prevention of vitamin D    deficiency: an Endocrine Society clinical practice    guideline. JCEM. 2011 Jul; 96(7):1911-30.    Interpretation 10/11/2021 Note   Final   Supplemental report is available.    Health Maintenance Due  Topic Date Due   Zoster Vaccines- Shingrix (1 of 2) Never done   PAP SMEAR-Modifier  Never  done   MAMMOGRAM  01/25/2021   DEXA SCAN  02/08/2022   COVID-19 Vaccine (4 - 2023-24 season) 06/09/2022    There are no preventive care reminders to display for this patient.  Lab Results  Component Value Date   TSH 0.847 10/11/2021   Lab Results  Component Value Date   WBC 7.7 10/11/2021   HGB  12.1 10/11/2021   HCT 36.4 10/11/2021   MCV 93 10/11/2021   PLT 361 10/11/2021   Lab Results  Component Value Date   NA 143 10/11/2021   K 4.5 10/11/2021   CO2 24 10/11/2021   GLUCOSE 87 10/11/2021   BUN 15 10/11/2021   CREATININE 1.10 (H) 10/11/2021   BILITOT <0.2 10/11/2021   ALKPHOS 65 10/11/2021   AST 18 10/11/2021   ALT 18 10/11/2021   PROT 7.0 10/11/2021   ALBUMIN 4.7 10/11/2021   CALCIUM 9.8 10/11/2021   ANIONGAP 7 03/11/2019   EGFR 61 10/11/2021   Lab Results  Component Value Date   CHOL 160 10/11/2021   Lab Results  Component Value Date   HDL 42 10/11/2021   Lab Results  Component Value Date   LDLCALC 90 10/11/2021   Lab Results  Component Value Date   TRIG 159 (H) 10/11/2021   Lab Results  Component Value Date   CHOLHDL 3.8 10/11/2021   No results found for: "HGBA1C"    Assessment & Plan:   Problem List Items Addressed This Visit       Cardiovascular and Mediastinum   Benign hypertension - Primary   Relevant Orders   CBC with Differential/Platelet   Comprehensive metabolic panel Continue current meds as directed                   Endocrine   Other specified hypothyroidism   Relevant Orders   TSH Continue med     Musculoskeletal and Integument   Osteoporosis   Relevant Orders   VITAMIN D 25 Hydroxy (Vit-D Deficiency, Fractures) Continue follow up with specialist Dexa scan ordered  Need screening breast cancer Mammogram ordered  Need flu vaccine Flucelvax given  Need covid vaccine Covid booster given     Other   Dyslipidemia   Relevant Orders   Lipid panel Continue meds  GERD Continue meds as directed    Meds ordered this encounter  Medications   methocarbamol (ROBAXIN) 750 MG tablet    Sig: Take 2 tablets (1,500 mg total) by mouth 3 (three) times daily.    Dispense:  90 tablet    Refill:  1    Order Specific Question:   Supervising Provider    Answer:   Shelton Silvas   DISCONTD: sertraline (ZOLOFT)  100 MG tablet    Sig: Take 1 tablet (100 mg total) by mouth daily.    Dispense:  90 tablet    Refill:  1    Requesting 1 year supply    Order Specific Question:   Supervising Provider    Answer:   Shelton Silvas   sertraline (ZOLOFT) 100 MG tablet    Sig: Take 1 tablet (100 mg total) by mouth daily.    Dispense:  30 tablet    Refill:  1    Requesting 1 year supply    Order Specific Question:   Supervising Provider    Answer:   Shelton Silvas    Follow-up: Return in about 6 months (around 03/08/2023) for chronic fasting follow up.    SARA R [place] Inthavong,  PA-C

## 2022-09-08 ENCOUNTER — Ambulatory Visit: Payer: BC Managed Care – PPO | Admitting: Physician Assistant

## 2022-09-08 LAB — COMPREHENSIVE METABOLIC PANEL
ALT: 20 IU/L (ref 0–32)
AST: 21 IU/L (ref 0–40)
Albumin/Globulin Ratio: 2.1 (ref 1.2–2.2)
Albumin: 4.8 g/dL (ref 3.8–4.9)
Alkaline Phosphatase: 54 IU/L (ref 44–121)
BUN/Creatinine Ratio: 21 (ref 9–23)
BUN: 19 mg/dL (ref 6–24)
Bilirubin Total: 0.3 mg/dL (ref 0.0–1.2)
CO2: 22 mmol/L (ref 20–29)
Calcium: 9.4 mg/dL (ref 8.7–10.2)
Chloride: 105 mmol/L (ref 96–106)
Creatinine, Ser: 0.89 mg/dL (ref 0.57–1.00)
Globulin, Total: 2.3 g/dL (ref 1.5–4.5)
Glucose: 95 mg/dL (ref 70–99)
Potassium: 5.1 mmol/L (ref 3.5–5.2)
Sodium: 143 mmol/L (ref 134–144)
Total Protein: 7.1 g/dL (ref 6.0–8.5)
eGFR: 78 mL/min/{1.73_m2} (ref >59)

## 2022-09-08 LAB — CBC WITH DIFFERENTIAL/PLATELET
Basophils Absolute: 0 10*3/uL (ref 0.0–0.2)
Basos: 1 %
EOS (ABSOLUTE): 0.3 10*3/uL (ref 0.0–0.4)
Eos: 5 %
Hematocrit: 34.4 % (ref 34.0–46.6)
Hemoglobin: 11.1 g/dL (ref 11.1–15.9)
Immature Grans (Abs): 0 10*3/uL (ref 0.0–0.1)
Immature Granulocytes: 0 %
Lymphocytes Absolute: 1.4 10*3/uL (ref 0.7–3.1)
Lymphs: 26 %
MCH: 29.6 pg (ref 26.6–33.0)
MCHC: 32.3 g/dL (ref 31.5–35.7)
MCV: 92 fL (ref 79–97)
Monocytes Absolute: 0.5 10*3/uL (ref 0.1–0.9)
Monocytes: 8 %
Neutrophils Absolute: 3.3 10*3/uL (ref 1.4–7.0)
Neutrophils: 60 %
Platelets: 386 10*3/uL (ref 150–450)
RBC: 3.75 x10E6/uL — ABNORMAL LOW (ref 3.77–5.28)
RDW: 15.1 % (ref 11.7–15.4)
WBC: 5.5 10*3/uL (ref 3.4–10.8)

## 2022-09-08 LAB — LIPID PANEL
Chol/HDL Ratio: 4.8 ratio — ABNORMAL HIGH (ref 0.0–4.4)
Cholesterol, Total: 174 mg/dL (ref 100–199)
HDL: 36 mg/dL — ABNORMAL LOW (ref >39)
LDL Chol Calc (NIH): 99 mg/dL (ref 0–99)
Triglycerides: 230 mg/dL — ABNORMAL HIGH (ref 0–149)
VLDL Cholesterol Cal: 39 mg/dL (ref 5–40)

## 2022-09-08 LAB — CARDIOVASCULAR RISK ASSESSMENT

## 2022-09-08 LAB — VITAMIN D 25 HYDROXY (VIT D DEFICIENCY, FRACTURES): Vit D, 25-Hydroxy: 23.2 ng/mL — ABNORMAL LOW (ref 30.0–100.0)

## 2022-09-08 LAB — TSH: TSH: 1.16 u[IU]/mL (ref 0.450–4.500)

## 2022-09-11 ENCOUNTER — Other Ambulatory Visit: Payer: Self-pay | Admitting: Physician Assistant

## 2022-09-11 DIAGNOSIS — F419 Anxiety disorder, unspecified: Secondary | ICD-10-CM

## 2022-09-11 MED ORDER — LORAZEPAM 0.5 MG PO TABS: 0.5000 mg | ORAL_TABLET | Freq: Every day | ORAL | 0 refills | Status: DC | PRN

## 2022-09-12 ENCOUNTER — Ambulatory Visit: Payer: BC Managed Care – PPO | Admitting: Physician Assistant

## 2022-09-29 ENCOUNTER — Other Ambulatory Visit: Payer: Self-pay | Admitting: Physician Assistant

## 2022-10-01 ENCOUNTER — Other Ambulatory Visit: Payer: Self-pay | Admitting: Physician Assistant

## 2022-10-16 ENCOUNTER — Other Ambulatory Visit: Payer: Self-pay

## 2022-10-16 ENCOUNTER — Encounter: Payer: Self-pay | Admitting: Physician Assistant

## 2022-10-16 MED ORDER — PANTOPRAZOLE SODIUM 40 MG PO TBEC: 40.0000 mg | DELAYED_RELEASE_TABLET | Freq: Every evening | ORAL | 3 refills | Status: DC

## 2022-10-20 ENCOUNTER — Other Ambulatory Visit: Payer: Self-pay | Admitting: Physician Assistant

## 2022-10-20 DIAGNOSIS — I10 Essential (primary) hypertension: Secondary | ICD-10-CM

## 2022-11-03 ENCOUNTER — Other Ambulatory Visit: Payer: Self-pay | Admitting: Physician Assistant

## 2022-11-03 DIAGNOSIS — F419 Anxiety disorder, unspecified: Secondary | ICD-10-CM

## 2022-11-08 DIAGNOSIS — Z1231 Encounter for screening mammogram for malignant neoplasm of breast: Secondary | ICD-10-CM | POA: Diagnosis not present

## 2022-11-08 DIAGNOSIS — N959 Unspecified menopausal and perimenopausal disorder: Secondary | ICD-10-CM | POA: Diagnosis not present

## 2022-11-08 DIAGNOSIS — M8589 Other specified disorders of bone density and structure, multiple sites: Secondary | ICD-10-CM | POA: Diagnosis not present

## 2022-11-08 LAB — MM DIGITAL SCREENING BILATERAL

## 2022-11-13 ENCOUNTER — Other Ambulatory Visit: Payer: Self-pay

## 2022-11-13 DIAGNOSIS — Z1231 Encounter for screening mammogram for malignant neoplasm of breast: Secondary | ICD-10-CM

## 2022-11-23 ENCOUNTER — Other Ambulatory Visit: Payer: Self-pay | Admitting: Physician Assistant

## 2022-11-27 ENCOUNTER — Encounter: Payer: Self-pay | Admitting: Physician Assistant

## 2022-11-28 ENCOUNTER — Ambulatory Visit: Payer: BC Managed Care – PPO | Admitting: Physician Assistant

## 2022-11-28 ENCOUNTER — Encounter: Payer: Self-pay | Admitting: Physician Assistant

## 2022-11-28 VITALS — BP 140/80 | HR 95 | Temp 97.5°F | Resp 18 | Ht 62.0 in | Wt 165.0 lb

## 2022-11-28 DIAGNOSIS — J069 Acute upper respiratory infection, unspecified: Secondary | ICD-10-CM | POA: Insufficient documentation

## 2022-11-28 LAB — POCT INFLUENZA A/B
Influenza A, POC: NEGATIVE
Influenza B, POC: NEGATIVE

## 2022-11-28 LAB — POC COVID19 BINAXNOW: SARS Coronavirus 2 Ag: NEGATIVE

## 2022-11-28 MED ORDER — BENZONATATE 100 MG PO CAPS: 100.0000 mg | ORAL_CAPSULE | Freq: Two times a day (BID) | ORAL | 0 refills | Status: DC | PRN

## 2022-11-28 MED ORDER — AZITHROMYCIN 250 MG PO TABS: ORAL_TABLET | ORAL | 0 refills | Status: AC

## 2022-11-28 NOTE — Progress Notes (Signed)
Acute Office Visit  Subjective:    Patient ID: Bonnie Parker, female    DOB: 1971/05/06, 52 y.o.   MRN: [id number]  Chief Complaint  Patient presents with   Cough   Nasal Congestion    HPI Patient is in today for complaints of cough, cold and congestion for the past week - cough has been productive.  States she has not had a fever.  Has been taking otc alkaseltzer and robitussin. Husband with similar symptoms last week  Past Medical History:  Diagnosis Date   Age-related osteoporosis without current pathological fracture    Anxiety    Arthritis    Asthma    Atrophy of thyroid (acquired)    Depression    Drug-induced myopathy    Gastro-esophageal reflux disease without esophagitis    Hypothyroidism    Lumbar spinal stenosis    Mixed hyperlipidemia    Polyarthralgia    Sarcoidosis of other sites     Past Surgical History:  Procedure Laterality Date   ABDOMINAL HYSTERECTOMY  1999   Total   BACK SURGERY  2005   Dr. Marcha Dutton EAR SURGERY     LUMBAR LAMINECTOMY/DECOMPRESSION MICRODISCECTOMY N/A 06/21/2017   Procedure: Lumbar decompression L3-4 ;  Surgeon: Melina Schools, MD;  Location: [facility];  Service: Orthopedics;  Laterality: N/A;  3 hrs   SPINAL CORD STIMULATOR INSERTION N/A 03/13/2019   Procedure: LUMBAR SPINAL CORD STIMULATOR INSERTION;  Surgeon: Melina Schools, MD;  Location: [facility];  Service: Orthopedics;  Laterality: N/A;   TONSILLECTOMY      Family History  Problem Relation Age of Onset   Hypertension Mother    Cancer - Lung Father     Social History   Socioeconomic History   Marital status: Married    Spouse name: Not on file   Number of children: 4   Years of education: Not on file   Highest education level: Not on file  Occupational History   Not on file  Tobacco Use   Smoking status: Never   Smokeless tobacco: Never  Vaping Use   Vaping Use: Never used  Substance and Sexual Activity   Alcohol use: No   Drug use: No   Sexual  activity: Not on file  Other Topics Concern   Not on file  Social History Narrative   Not on file   Social Determinants of Health   Financial Resource Strain: Not on file  Food Insecurity: Not on file  Transportation Needs: Not on file  Physical Activity: Not on file  Stress: Not on file  Social Connections: Not on file  Intimate Partner Violence: Not on file     Current Outpatient Medications:    acetaminophen (TYLENOL) 500 MG tablet, Take 1,000 mg by mouth every 6 (six) hours as needed for moderate pain or headache., Disp: , Rfl:    albuterol (PROAIR HFA) 108 (90 Base) MCG/ACT inhaler, Inhale 1-2 puffs into the lungs every 6 (six) hours as needed., Disp: 1 each, Rfl: 5   alendronate (FOSAMAX) 70 MG tablet, TAKE 1 TABLET BY MOUTH  EVERY TUESDAY TAKE WITH A  FULL GLASS OF WATER ON AN  EMPTY STOMACH, Disp: 12 tablet, Rfl: 3   azithromycin (ZITHROMAX) 250 MG tablet, Take 2 tablets on day 1, then 1 tablet daily on days 2 through 5, Disp: 6 tablet, Rfl: 0   benzonatate (TESSALON) 100 MG capsule, Take 1 capsule (100 mg total) by mouth 2 (two) times daily as needed for  cough., Disp: 20 capsule, Rfl: 0   buPROPion (WELLBUTRIN XL) 300 MG 24 hr tablet, TAKE 1 TABLET BY MOUTH DAILY, Disp: 90 tablet, Rfl: 0   ezetimibe (ZETIA) 10 MG tablet, Take 10 mg by mouth daily., Disp: , Rfl:    fenofibrate 160 MG tablet, TAKE 1 TABLET BY MOUTH DAILY, Disp: 90 tablet, Rfl: 3   folic acid (FOLVITE) 1 MG tablet, Take 1 mg by mouth daily., Disp: , Rfl:    ibuprofen (ADVIL) 200 MG tablet, Take 200 mg by mouth every 6 (six) hours as needed., Disp: , Rfl:    levothyroxine (SYNTHROID) 50 MCG tablet, TAKE 1 TABLET BY MOUTH  DAILY BEFORE BREAKFAST, Disp: 90 tablet, Rfl: 3   lisinopril (ZESTRIL) 10 MG tablet, TAKE 1 TABLET BY MOUTH DAILY, Disp: 90 tablet, Rfl: 0   LORazepam (ATIVAN) 0.5 MG tablet, TAKE 1 TABLET BY MOUTH ONCE DAILY AS NEEDED FOR ANXIETY, Disp: 30 tablet, Rfl: 0   methocarbamol (ROBAXIN) 750 MG tablet,  Take 2 tablets (1,500 mg total) by mouth 3 (three) times daily., Disp: 90 tablet, Rfl: 1   Methotrexate 25 MG/ML SOSY, INJECT 0.5 ML  SUBCUTANEOUSLY EVERY 7 DAYS (DISCARD UNUSED AFTER FIRST USE), Disp: , Rfl:    pantoprazole (PROTONIX) 40 MG tablet, Take 1 tablet (40 mg total) by mouth every evening., Disp: 90 tablet, Rfl: 3   sertraline (ZOLOFT) 100 MG tablet, Take 1 tablet (100 mg total) by mouth daily., Disp: 30 tablet, Rfl: 1   Allergies  Allergen Reactions   Other Itching    blisters   Tape Other (See Comments) and Itching    blisters   Crestor [Rosuvastatin] Other (See Comments)    MYALGIAS   Lipitor [Atorvastatin] Other (See Comments)    MYALGIAS   Percocet [Oxycodone-Acetaminophen] Itching and Nausea Only    CONSTITUTIONAL: Negative for chills, fatigue, fever,  E/N/T: see HPI CARDIOVASCULAR: Negative for chest pain, dizziness, RESPIRATORY: see HPI     Objective:    PHYSICAL EXAM:   VS: BP (!) 140/80   Pulse 95   Temp (!) 97.5 F (36.4 C)   Resp 18   Ht 5' 2"$  (1.575 m)   Wt 165 lb (74.8 kg)   LMP 10/09/1997 (Within Months) Comment: hysterectomy 1999  SpO2 96%   BMI 30.18 kg/m   GEN: Well nourished, well developed, in no acute distress  HEENT: normal external ears and nose - normal external auditory canals and TMS - - Lips, Teeth and Gums - normal  Oropharynx mild erythema/pnd Cardiac: RRR; no murmurs,  Respiratory:  scattered rhonchi - clears with cough  Office Visit on 11/28/2022  Component Date Value Ref Range Status   SARS Coronavirus 2 Ag 11/28/2022 Negative  Negative Final   Influenza A, POC 11/28/2022 Negative  Negative Final   Influenza B, POC 11/28/2022 Negative  Negative Final      Wt Readings from Last 3 Encounters:  11/28/22 165 lb (74.8 kg)  09/07/22 169 lb 12.8 oz (77 kg)  11/28/21 165 lb (74.8 kg)    Health Maintenance Due  Topic Date Due   Zoster Vaccines- Shingrix (1 of 2) Never done   COVID-19 Vaccine (5 - 2023-24 season)  11/02/2022    There are no preventive care reminders to display for this patient.        Assessment & Plan:   Problem List Items Addressed This Visit       Respiratory   Acute URI - Primary   Relevant Medications  benzonatate (TESSALON) 100 MG capsule   azithromycin (ZITHROMAX) 250 MG tablet   Other Relevant Orders   POC COVID-19 BinaxNow (Completed)   POCT Influenza A/B (Completed)     Meds ordered this encounter  Medications   benzonatate (TESSALON) 100 MG capsule    Sig: Take 1 capsule (100 mg total) by mouth 2 (two) times daily as needed for cough.    Dispense:  20 capsule    Refill:  0    Order Specific Question:   Supervising Provider    Answer:   Shelton Silvas   azithromycin (ZITHROMAX) 250 MG tablet    Sig: Take 2 tablets on day 1, then 1 tablet daily on days 2 through 5    Dispense:  6 tablet    Refill:  0    Order Specific Question:   Supervising Provider    Answer:   Rochel Brome [id number]     Mayes, PA-C

## 2022-12-01 ENCOUNTER — Encounter: Payer: Self-pay | Admitting: Physician Assistant

## 2022-12-04 NOTE — Telephone Encounter (Signed)
No - zpack you take for 5 days - stays in system longer --- continue decongestants and cough meds Follow up if symptoms worsen or persist until next week

## 2022-12-07 ENCOUNTER — Other Ambulatory Visit: Payer: Self-pay | Admitting: Physician Assistant

## 2022-12-08 ENCOUNTER — Other Ambulatory Visit: Payer: Self-pay | Admitting: Physician Assistant

## 2022-12-08 DIAGNOSIS — J069 Acute upper respiratory infection, unspecified: Secondary | ICD-10-CM

## 2022-12-12 MED ORDER — LEVOTHYROXINE SODIUM 50 MCG PO TABS: 50.0000 ug | ORAL_TABLET | Freq: Every day | ORAL | 0 refills | Status: DC

## 2022-12-12 MED ORDER — BENZONATATE 100 MG PO CAPS: 100.0000 mg | ORAL_CAPSULE | Freq: Two times a day (BID) | ORAL | 0 refills | Status: DC | PRN

## 2022-12-13 ENCOUNTER — Other Ambulatory Visit: Payer: Self-pay | Admitting: Physician Assistant

## 2022-12-13 ENCOUNTER — Encounter: Payer: Self-pay | Admitting: Physician Assistant

## 2022-12-13 ENCOUNTER — Other Ambulatory Visit: Payer: Self-pay

## 2022-12-13 DIAGNOSIS — E782 Mixed hyperlipidemia: Secondary | ICD-10-CM

## 2022-12-13 DIAGNOSIS — F331 Major depressive disorder, recurrent, moderate: Secondary | ICD-10-CM

## 2022-12-13 NOTE — Telephone Encounter (Signed)
Bonnie Parker  P [facility] (supporting Marge Duncans, PA-C)34 minutes ago (3:51 PM)    [facility] in [place] please    Arsenio Katz, CMA  Zoeie J Wallace35 minutes ago (3:50 PM)    Is there another pharmacy you would like for her to send the prescription to?    Bonnie Parker  P [facility] (supporting Marge Duncans, PA-C)1 hour ago (3:23 PM)    Lennette Bihari is saying this medicine is out of stock,  and I  either need a new medicine or call Into a different pharmacy til they get it back in.

## 2022-12-14 MED ORDER — EZETIMIBE 10 MG PO TABS: 10.0000 mg | ORAL_TABLET | Freq: Every day | ORAL | 1 refills | Status: AC

## 2023-01-04 ENCOUNTER — Other Ambulatory Visit: Payer: Self-pay | Admitting: Physician Assistant

## 2023-01-04 DIAGNOSIS — F419 Anxiety disorder, unspecified: Secondary | ICD-10-CM

## 2023-01-04 MED ORDER — LORAZEPAM 0.5 MG PO TABS: 0.5000 mg | ORAL_TABLET | Freq: Every day | ORAL | 0 refills | Status: DC | PRN

## 2023-01-17 DIAGNOSIS — Z79899 Other long term (current) drug therapy: Secondary | ICD-10-CM | POA: Diagnosis not present

## 2023-01-31 ENCOUNTER — Other Ambulatory Visit: Payer: Self-pay | Admitting: Physician Assistant

## 2023-01-31 DIAGNOSIS — I10 Essential (primary) hypertension: Secondary | ICD-10-CM

## 2023-02-01 DIAGNOSIS — M810 Age-related osteoporosis without current pathological fracture: Secondary | ICD-10-CM | POA: Diagnosis not present

## 2023-02-01 DIAGNOSIS — Z79899 Other long term (current) drug therapy: Secondary | ICD-10-CM | POA: Diagnosis not present

## 2023-02-01 DIAGNOSIS — D869 Sarcoidosis, unspecified: Secondary | ICD-10-CM | POA: Diagnosis not present

## 2023-02-07 NOTE — Progress Notes (Signed)
This encounter was created in error - please disregard.

## 2023-02-08 NOTE — Progress Notes (Signed)
This encounter was created in error - please disregard.

## 2023-02-26 DIAGNOSIS — D869 Sarcoidosis, unspecified: Secondary | ICD-10-CM | POA: Diagnosis not present

## 2023-02-26 DIAGNOSIS — N132 Hydronephrosis with renal and ureteral calculous obstruction: Secondary | ICD-10-CM | POA: Diagnosis not present

## 2023-02-27 ENCOUNTER — Other Ambulatory Visit: Payer: Self-pay | Admitting: Physician Assistant

## 2023-02-27 DIAGNOSIS — M25551 Pain in right hip: Secondary | ICD-10-CM

## 2023-03-06 DIAGNOSIS — N200 Calculus of kidney: Secondary | ICD-10-CM | POA: Diagnosis not present

## 2023-03-09 ENCOUNTER — Ambulatory Visit: Payer: BC Managed Care – PPO | Admitting: Physician Assistant

## 2023-03-09 DIAGNOSIS — N200 Calculus of kidney: Secondary | ICD-10-CM | POA: Diagnosis not present

## 2023-03-09 DIAGNOSIS — E785 Hyperlipidemia, unspecified: Secondary | ICD-10-CM | POA: Diagnosis not present

## 2023-03-09 DIAGNOSIS — E039 Hypothyroidism, unspecified: Secondary | ICD-10-CM | POA: Diagnosis not present

## 2023-03-09 DIAGNOSIS — Z79899 Other long term (current) drug therapy: Secondary | ICD-10-CM | POA: Diagnosis not present

## 2023-03-09 DIAGNOSIS — J45909 Unspecified asthma, uncomplicated: Secondary | ICD-10-CM | POA: Diagnosis not present

## 2023-03-09 DIAGNOSIS — N132 Hydronephrosis with renal and ureteral calculous obstruction: Secondary | ICD-10-CM | POA: Diagnosis not present

## 2023-03-09 DIAGNOSIS — I10 Essential (primary) hypertension: Secondary | ICD-10-CM | POA: Diagnosis not present

## 2023-03-20 ENCOUNTER — Ambulatory Visit: Payer: BC Managed Care – PPO | Admitting: Physician Assistant

## 2023-03-22 ENCOUNTER — Encounter: Payer: Self-pay | Admitting: Otolaryngology

## 2023-03-22 DIAGNOSIS — N200 Calculus of kidney: Secondary | ICD-10-CM | POA: Diagnosis not present

## 2023-04-16 ENCOUNTER — Other Ambulatory Visit: Payer: Self-pay | Admitting: Physician Assistant

## 2023-04-16 DIAGNOSIS — F331 Major depressive disorder, recurrent, moderate: Secondary | ICD-10-CM

## 2023-04-24 ENCOUNTER — Encounter: Payer: Self-pay | Admitting: Physician Assistant

## 2023-04-24 ENCOUNTER — Ambulatory Visit: Payer: BC Managed Care – PPO | Admitting: Physician Assistant

## 2023-04-24 VITALS — BP 120/70 | HR 89 | Temp 97.5°F | Ht 62.0 in | Wt 162.0 lb

## 2023-04-24 DIAGNOSIS — E559 Vitamin D deficiency, unspecified: Secondary | ICD-10-CM | POA: Diagnosis not present

## 2023-04-24 DIAGNOSIS — M0579 Rheumatoid arthritis with rheumatoid factor of multiple sites without organ or systems involvement: Secondary | ICD-10-CM

## 2023-04-24 DIAGNOSIS — E785 Hyperlipidemia, unspecified: Secondary | ICD-10-CM

## 2023-04-24 DIAGNOSIS — D642 Secondary sideroblastic anemia due to drugs and toxins: Secondary | ICD-10-CM | POA: Diagnosis not present

## 2023-04-24 DIAGNOSIS — F419 Anxiety disorder, unspecified: Secondary | ICD-10-CM

## 2023-04-24 DIAGNOSIS — I10 Essential (primary) hypertension: Secondary | ICD-10-CM

## 2023-04-24 DIAGNOSIS — E038 Other specified hypothyroidism: Secondary | ICD-10-CM | POA: Diagnosis not present

## 2023-04-24 DIAGNOSIS — Z1211 Encounter for screening for malignant neoplasm of colon: Secondary | ICD-10-CM

## 2023-04-24 DIAGNOSIS — K219 Gastro-esophageal reflux disease without esophagitis: Secondary | ICD-10-CM

## 2023-04-24 MED ORDER — LORAZEPAM 0.5 MG PO TABS
0.5000 mg | ORAL_TABLET | Freq: Every day | ORAL | 1 refills | Status: DC | PRN
Start: 2023-04-24 — End: 2023-11-02

## 2023-04-24 NOTE — Progress Notes (Signed)
Established Patient Office Visit  Subjective:  Patient ID: Bonnie Parker, female    DOB: 1971-07-15  Age: 52 y.o. MRN: [id number]  CC:  Chief Complaint  Patient presents with   Hyperlipidemia   Hypertension           HPI Bonnie Parker presents for hyperlipidemia  Mixed hyperlipidemia  Pt presents with hyperlipidemia. . Compliance with treatment has been good The patient is compliant with medications, maintains a low cholesterol diet , follows up as directed , and maintains an exercise regimen . The patient denies experiencing any hypercholesterolemia related symptoms. Pt currently on Zetia 10mg  qd and fenofibrate 160mg   Pt with history of sarcoidosis - she sees specialist Dr Jannet Mantis in [place] and also eye specialist Dr Sherryll Burger - currently on methotrexate weekly for management - her last appt with Dr Reece Agar was in April  Pt with history of anxiety and depression - she is currently taking zoloft 100mg  qd and wellburtin XL 300mg  qd - states symptoms are stable at this time.  She uses ativan as needed and requests refill today  Pt with history of osteoporosis- currently taking fosamax 70mg  weekly - is up to date on dexa scan  Pt states she is no longer taking vit D supplements - she states rheumatologist told her to stop  Pt with history of hypothyroidism- is taking qd - voices no problems or concerns Is overdue to check TSH  Pt with history of GERD - she is currently on protonix 40mg  every day - she states for over the past year she has had breakthrough symptoms almost daily - pt is also due for screening colonoscopy - recommend GI referral for further evaluation  Pt presents for follow up of hypertension. The patient is tolerating the medication well without side effects. Compliance with treatment has been good; including taking medication as directed , maintains a healthy diet and regular exercise regimen , and following up as directed. Currently taking zestril 10mg   qd    Past Medical History:  Diagnosis Date   Age-related osteoporosis without current pathological fracture    Anxiety    Arthritis    Asthma    Atrophy of thyroid (acquired)    Depression    Drug-induced myopathy    Gastro-esophageal reflux disease without esophagitis    Hypothyroidism    Lumbar spinal stenosis    Mixed hyperlipidemia    Polyarthralgia    Sarcoidosis of other sites     Past Surgical History:  Procedure Laterality Date   ABDOMINAL HYSTERECTOMY  1999   Total   BACK SURGERY  2005   Dr. Ronald Lobo EAR SURGERY     LUMBAR LAMINECTOMY/DECOMPRESSION MICRODISCECTOMY N/A 06/21/2017   Procedure: Lumbar decompression L3-4 ;  Surgeon: Venita Lick, MD;  Location: [facility] OR;  Service: Orthopedics;  Laterality: N/A;  3 hrs   SPINAL CORD STIMULATOR INSERTION N/A 03/13/2019   Procedure: LUMBAR SPINAL CORD STIMULATOR INSERTION;  Surgeon: Venita Lick, MD;  Location: [facility] OR;  Service: Orthopedics;  Laterality: N/A;   TONSILLECTOMY      Family History  Problem Relation Age of Onset   Hypertension Mother    Cancer - Lung Father     Social History   Socioeconomic History   Marital status: Married    Spouse name: Not on file   Number of children: 4   Years of education: Not on file   Highest education level: Not on file  Occupational History   Not on  file  Tobacco Use   Smoking status: Never   Smokeless tobacco: Never  Vaping Use   Vaping status: Never Used  Substance and Sexual Activity   Alcohol use: No   Drug use: No   Sexual activity: Not on file  Other Topics Concern   Not on file  Social History Narrative   Not on file   Social Determinants of Health   Financial Resource Strain: Low Risk  (04/24/2023)   Overall Financial Resource Strain (CARDIA)    Difficulty of Paying Living Expenses: Not hard at all  Food Insecurity: No Food Insecurity (04/24/2023)   Hunger Vital Sign    Worried About Running Out of Food in the Last Year: Never true    Ran  Out of Food in the Last Year: Never true  Transportation Needs: No Transportation Needs (04/24/2023)   PRAPARE - Administrator, Civil Service (Medical): No    Lack of Transportation (Non-Medical): No  Physical Activity: Inactive (04/24/2023)   Exercise Vital Sign    Days of Exercise per Week: 0 days    Minutes of Exercise per Session: 0 min  Stress: No Stress Concern Present (04/24/2023)   [employer] of Occupational Health - Occupational Stress Questionnaire    Feeling of Stress : Not at all  Social Connections: Moderately Isolated (04/24/2023)   Social Connection and Isolation Panel [NHANES]    Frequency of Communication with Friends and Family: More than three times a week    Frequency of Social Gatherings with Friends and Family: More than three times a week    Attends Religious Services: Never    Database administrator or Organizations: No    Attends Banker Meetings: Never    Marital Status: Married  Catering manager Violence: Not At Risk (04/24/2023)   Humiliation, Afraid, Rape, and Kick questionnaire    Fear of Current or Ex-Partner: No    Emotionally Abused: No    Physically Abused: No    Sexually Abused: No     Current Outpatient Medications:    acetaminophen (TYLENOL) 500 MG tablet, Take 1,000 mg by mouth every 6 (six) hours as needed for moderate pain or headache., Disp: , Rfl:    albuterol (PROAIR HFA) 108 (90 Base) MCG/ACT inhaler, Inhale 1-2 puffs into the lungs every 6 (six) hours as needed., Disp: 1 each, Rfl: 5   alendronate (FOSAMAX) 70 MG tablet, TAKE 1 TABLET BY MOUTH  EVERY TUESDAY TAKE WITH A  FULL GLASS OF WATER ON AN  EMPTY STOMACH, Disp: 12 tablet, Rfl: 3   benzonatate (TESSALON) 100 MG capsule, Take 1 capsule (100 mg total) by mouth 2 (two) times daily as needed for cough., Disp: 20 capsule, Rfl: 0   buPROPion (WELLBUTRIN XL) 300 MG 24 hr tablet, TAKE 1 TABLET BY MOUTH DAILY, Disp: 90 tablet, Rfl: 0   ezetimibe (ZETIA) 10 MG  tablet, Take 1 tablet (10 mg total) by mouth daily., Disp: 30 tablet, Rfl: 1   fenofibrate 160 MG tablet, TAKE 1 TABLET BY MOUTH DAILY, Disp: 90 tablet, Rfl: 3   folic acid (FOLVITE) 1 MG tablet, Take 1 mg by mouth daily., Disp: , Rfl:    ibuprofen (ADVIL) 200 MG tablet, Take 200 mg by mouth every 6 (six) hours as needed., Disp: , Rfl:    levothyroxine (SYNTHROID) 50 MCG tablet, Take 1 tablet (50 mcg total) by mouth daily before breakfast., Disp: 90 tablet, Rfl: 0   lisinopril (ZESTRIL) 10 MG tablet, TAKE  1 TABLET BY MOUTH DAILY, Disp: 90 tablet, Rfl: 0   methocarbamol (ROBAXIN) 750 MG tablet, TAKE 2 TABLETS BY MOUTH 3 TIMES  DAILY, Disp: 90 tablet, Rfl: 1   Methotrexate Sodium (METHOTREXATE, PF,) 50 MG/2ML injection, , Disp: , Rfl:    ondansetron (ZOFRAN) 4 MG tablet, Take by mouth., Disp: , Rfl:    oxybutynin (DITROPAN-XL) 10 MG 24 hr tablet, Take 1 tablet by mouth daily., Disp: , Rfl:    pantoprazole (PROTONIX) 40 MG tablet, Take 1 tablet (40 mg total) by mouth every evening., Disp: 90 tablet, Rfl: 3   sertraline (ZOLOFT) 100 MG tablet, TAKE 1 TABLET BY MOUTH DAILY, Disp: 90 tablet, Rfl: 0   tamsulosin (FLOMAX) 0.4 MG CAPS capsule, Take 1 capsule by mouth daily., Disp: , Rfl:    TUBERCULIN SYR 1CC/27GX1/2" (B-D TB SYRINGE 1CC/27GX1/2") 27G X 1/2" 1 ML MISC, USE AS DIRECTED WITH  METHOTREXATE EVERY 7 DAYS, Disp: , Rfl:    LORazepam (ATIVAN) 0.5 MG tablet, Take 1 tablet (0.5 mg total) by mouth daily as needed for anxiety., Disp: 30 tablet, Rfl: 1   Allergies  Allergen Reactions   Other Itching    blisters   Tape Other (See Comments) and Itching    blisters   Crestor [Rosuvastatin] Other (See Comments)    MYALGIAS   Lipitor [Atorvastatin] Other (See Comments)    MYALGIAS   Percocet [Oxycodone-Acetaminophen] Itching and Nausea Only   CONSTITUTIONAL: Negative for chills, fatigue, fever, unintentional weight gain and unintentional weight loss.  E/N/T: Negative for ear pain, nasal  congestion and sore throat.  CARDIOVASCULAR: Negative for chest pain, dizziness, palpitations and pedal edema.  RESPIRATORY: Negative for recent cough and dyspnea.  GASTROINTESTINAL: see HPI MSK: Negative for arthralgias and myalgias.  INTEGUMENTARY: Negative for rash.  NEUROLOGICAL: Negative for dizziness and headaches.  PSYCHIATRIC: Negative for sleep disturbance and to question depression screen.  Negative for depression, negative for anhedonia.      Objective:  PHYSICAL EXAM:   VS: BP 120/70 (BP Location: Left Arm, Patient Position: Sitting, Cuff Size: Large)   Pulse 89   Temp (!) 97.5 F (36.4 C) (Temporal)   Ht 5\' 2"  (1.575 m)   Wt 162 lb (73.5 kg)   LMP 10/09/1997 (Within Months) Comment: hysterectomy 1999  SpO2 98%   BMI 29.63 kg/m   GEN: Well nourished, well developed, in no acute distress  Cardiac: RRR; no murmurs, rubs, or gallops,no edema -  Respiratory:  normal respiratory rate and pattern with no distress - normal breath sounds with no rales, rhonchi, wheezes or rubs GI: normal bowel sounds, no masses or tenderness MS: no deformity or atrophy  Skin: warm and dry, no rash  Psych: euthymic mood, appropriate affect and demeanor    04/24/2023   10:35 AM 09/07/2022   10:53 AM 10/18/2020    9:07 AM 10/18/2020    9:04 AM 01/01/2020    8:30 AM  Depression screen PHQ 2/9  Decreased Interest 0 1 0 0 0  Down, Depressed, Hopeless 1 0 0 0 1  PHQ - 2 Score 1 1 0 0 1  Altered sleeping 1 0 0  2  Tired, decreased energy 1 3 0  2  Change in appetite 0 0 0  0  Feeling bad or failure about yourself  0 0 0  0  Trouble concentrating 0 0 0  0  Moving slowly or fidgety/restless 0 0 0  0  Suicidal thoughts 0 0 0  0  PHQ-9  Score 3 4 0  5  Difficult doing work/chores Somewhat difficult Not difficult at all         No visits with results within 1 Day(s) from this visit.  Latest known visit with results is:  Office Visit on 11/28/2022  Component Date Value Ref Range Status    SARS Coronavirus 2 Ag 11/28/2022 Negative  Negative Final   Influenza A, POC 11/28/2022 Negative  Negative Final   Influenza B, POC 11/28/2022 Negative  Negative Final    Health Maintenance Due  Topic Date Due   Zoster Vaccines- Shingrix (1 of 2) Never done   Colonoscopy  Never done    There are no preventive care reminders to display for this patient.  Lab Results  Component Value Date   TSH 1.160 09/07/2022   Lab Results  Component Value Date   WBC 5.5 09/07/2022   HGB 11.1 09/07/2022   HCT 34.4 09/07/2022   MCV 92 09/07/2022   PLT 386 09/07/2022   Lab Results  Component Value Date   NA 143 09/07/2022   K 5.1 09/07/2022   CO2 22 09/07/2022   GLUCOSE 95 09/07/2022   BUN 19 09/07/2022   CREATININE 0.89 09/07/2022   BILITOT 0.3 09/07/2022   ALKPHOS 54 09/07/2022   AST 21 09/07/2022   ALT 20 09/07/2022   PROT 7.1 09/07/2022   ALBUMIN 4.8 09/07/2022   CALCIUM 9.4 09/07/2022   ANIONGAP 7 03/11/2019   EGFR 78 09/07/2022   Lab Results  Component Value Date   CHOL 174 09/07/2022   Lab Results  Component Value Date   HDL 36 (L) 09/07/2022   Lab Results  Component Value Date   LDLCALC 99 09/07/2022   Lab Results  Component Value Date   TRIG 230 (H) 09/07/2022   Lab Results  Component Value Date   CHOLHDL 4.8 (H) 09/07/2022   No results found for: "HGBA1C"    Assessment & Plan:   Problem List Items Addressed This Visit       Cardiovascular and Mediastinum   Benign hypertension - Primary   Relevant Orders   CBC with Differential/Platelet   Comprehensive metabolic panel Continue current meds as directed     GERD - uncontrolled   Continue protonix   Referral to GI for further evaluation and treatment        Endocrine   Other specified hypothyroidism   Relevant Orders   TSH Continue med     Musculoskeletal and Integument   Osteoporosis   Relevant Orders   VITAMIN D 25 Hydroxy (Vit-D Deficiency, Fractures) Continue fosamax      Other    Dyslipidemia   Relevant Orders   Lipid panel Continue meds  Anxiety Continue current meds  Sarcoidosis Continue meds and follow up with specialist as directed    Meds ordered this encounter  Medications   LORazepam (ATIVAN) 0.5 MG tablet    Sig: Take 1 tablet (0.5 mg total) by mouth daily as needed for anxiety.    Dispense:  30 tablet    Refill:  1    Order Specific Question:   Supervising Provider    AnswerCorey Harold    Follow-up: Return in about 6 months (around 10/25/2023) for chronic fasting follow-up.    SARA R Garron Eline, PA-C

## 2023-04-25 ENCOUNTER — Other Ambulatory Visit: Payer: Self-pay | Admitting: Physician Assistant

## 2023-04-25 DIAGNOSIS — R899 Unspecified abnormal finding in specimens from other organs, systems and tissues: Secondary | ICD-10-CM

## 2023-04-25 LAB — CBC WITH DIFFERENTIAL/PLATELET
Basophils Absolute: 0.1 10*3/uL (ref 0.0–0.2)
Basos: 1 %
EOS (ABSOLUTE): 0.3 10*3/uL (ref 0.0–0.4)
Eos: 4 %
Hematocrit: 32.9 % — ABNORMAL LOW (ref 34.0–46.6)
Hemoglobin: 10.7 g/dL — ABNORMAL LOW (ref 11.1–15.9)
Immature Grans (Abs): 0 10*3/uL (ref 0.0–0.1)
Immature Granulocytes: 0 %
Lymphocytes Absolute: 1.2 10*3/uL (ref 0.7–3.1)
Lymphs: 20 %
MCH: 29.1 pg (ref 26.6–33.0)
MCHC: 32.5 g/dL (ref 31.5–35.7)
MCV: 89 fL (ref 79–97)
Monocytes Absolute: 0.6 10*3/uL (ref 0.1–0.9)
Monocytes: 9 %
Neutrophils Absolute: 4.1 10*3/uL (ref 1.4–7.0)
Neutrophils: 66 %
Platelets: 480 10*3/uL — ABNORMAL HIGH (ref 150–450)
RBC: 3.68 x10E6/uL — ABNORMAL LOW (ref 3.77–5.28)
RDW: 14.5 % (ref 11.7–15.4)
WBC: 6.2 10*3/uL (ref 3.4–10.8)

## 2023-04-25 LAB — LIPID PANEL
Chol/HDL Ratio: 4.1 ratio (ref 0.0–4.4)
Cholesterol, Total: 186 mg/dL (ref 100–199)
HDL: 45 mg/dL (ref >39)
LDL Chol Calc (NIH): 116 mg/dL — ABNORMAL HIGH (ref 0–99)
Triglycerides: 139 mg/dL (ref 0–149)
VLDL Cholesterol Cal: 25 mg/dL (ref 5–40)

## 2023-04-25 LAB — COMPREHENSIVE METABOLIC PANEL
ALT: 20 IU/L (ref 0–32)
AST: 23 IU/L (ref 0–40)
Albumin: 4.6 g/dL (ref 3.8–4.9)
Alkaline Phosphatase: 55 IU/L (ref 44–121)
BUN/Creatinine Ratio: 15 (ref 9–23)
BUN: 17 mg/dL (ref 6–24)
Bilirubin Total: 0.3 mg/dL (ref 0.0–1.2)
CO2: 21 mmol/L (ref 20–29)
Calcium: 10 mg/dL (ref 8.7–10.2)
Chloride: 104 mmol/L (ref 96–106)
Creatinine, Ser: 1.15 mg/dL — ABNORMAL HIGH (ref 0.57–1.00)
Globulin, Total: 2.4 g/dL (ref 1.5–4.5)
Glucose: 92 mg/dL (ref 70–99)
Potassium: 4.8 mmol/L (ref 3.5–5.2)
Sodium: 140 mmol/L (ref 134–144)
Total Protein: 7 g/dL (ref 6.0–8.5)
eGFR: 57 mL/min/{1.73_m2} — ABNORMAL LOW (ref >59)

## 2023-04-25 LAB — TSH: TSH: 1.36 u[IU]/mL (ref 0.450–4.500)

## 2023-04-25 LAB — LITHOLINK CKD PROGRAM

## 2023-04-25 LAB — VITAMIN D 25 HYDROXY (VIT D DEFICIENCY, FRACTURES): Vit D, 25-Hydroxy: 54.8 ng/mL (ref 30.0–100.0)

## 2023-04-28 ENCOUNTER — Other Ambulatory Visit: Payer: Self-pay | Admitting: Physician Assistant

## 2023-04-28 DIAGNOSIS — I10 Essential (primary) hypertension: Secondary | ICD-10-CM

## 2023-04-30 LAB — IRON,TIBC AND FERRITIN PANEL
Ferritin: 22 ng/mL (ref 15–150)
Iron Saturation: 11 % — ABNORMAL LOW (ref 15–55)
Iron: 61 ug/dL (ref 27–159)
Total Iron Binding Capacity: 537 ug/dL — ABNORMAL HIGH (ref 250–450)
UIBC: 476 ug/dL — ABNORMAL HIGH (ref 131–425)

## 2023-04-30 LAB — SPECIMEN STATUS REPORT

## 2023-05-01 ENCOUNTER — Encounter: Payer: Self-pay | Admitting: Physician Assistant

## 2023-05-02 ENCOUNTER — Encounter: Payer: Self-pay | Admitting: Gastroenterology

## 2023-05-02 ENCOUNTER — Other Ambulatory Visit: Payer: Self-pay

## 2023-05-07 DIAGNOSIS — N132 Hydronephrosis with renal and ureteral calculous obstruction: Secondary | ICD-10-CM | POA: Diagnosis not present

## 2023-05-07 DIAGNOSIS — Z87442 Personal history of urinary calculi: Secondary | ICD-10-CM | POA: Diagnosis not present

## 2023-05-07 DIAGNOSIS — N2889 Other specified disorders of kidney and ureter: Secondary | ICD-10-CM | POA: Diagnosis not present

## 2023-05-07 DIAGNOSIS — N1339 Other hydronephrosis: Secondary | ICD-10-CM | POA: Diagnosis not present

## 2023-05-07 DIAGNOSIS — N200 Calculus of kidney: Secondary | ICD-10-CM | POA: Diagnosis not present

## 2023-05-07 DIAGNOSIS — Z09 Encounter for follow-up examination after completed treatment for conditions other than malignant neoplasm: Secondary | ICD-10-CM | POA: Diagnosis not present

## 2023-05-22 ENCOUNTER — Other Ambulatory Visit: Payer: Self-pay

## 2023-05-22 MED ORDER — LEVOTHYROXINE SODIUM 50 MCG PO TABS: 50.0000 ug | ORAL_TABLET | Freq: Every day | ORAL | 0 refills | Status: DC

## 2023-05-23 ENCOUNTER — Other Ambulatory Visit: Payer: Self-pay | Admitting: Family Medicine

## 2023-05-28 DIAGNOSIS — N200 Calculus of kidney: Secondary | ICD-10-CM | POA: Diagnosis not present

## 2023-06-13 DIAGNOSIS — J45909 Unspecified asthma, uncomplicated: Secondary | ICD-10-CM | POA: Diagnosis not present

## 2023-06-13 DIAGNOSIS — N202 Calculus of kidney with calculus of ureter: Secondary | ICD-10-CM | POA: Diagnosis not present

## 2023-06-13 DIAGNOSIS — N2889 Other specified disorders of kidney and ureter: Secondary | ICD-10-CM | POA: Diagnosis not present

## 2023-06-13 DIAGNOSIS — E039 Hypothyroidism, unspecified: Secondary | ICD-10-CM | POA: Diagnosis not present

## 2023-06-13 DIAGNOSIS — Z79899 Other long term (current) drug therapy: Secondary | ICD-10-CM | POA: Diagnosis not present

## 2023-06-13 DIAGNOSIS — N200 Calculus of kidney: Secondary | ICD-10-CM | POA: Diagnosis not present

## 2023-06-13 DIAGNOSIS — I10 Essential (primary) hypertension: Secondary | ICD-10-CM | POA: Diagnosis not present

## 2023-06-13 DIAGNOSIS — D649 Anemia, unspecified: Secondary | ICD-10-CM | POA: Diagnosis not present

## 2023-06-16 ENCOUNTER — Other Ambulatory Visit: Payer: Self-pay | Admitting: Physician Assistant

## 2023-06-16 DIAGNOSIS — I10 Essential (primary) hypertension: Secondary | ICD-10-CM

## 2023-06-27 ENCOUNTER — Other Ambulatory Visit: Payer: Self-pay | Admitting: Physician Assistant

## 2023-06-27 DIAGNOSIS — F331 Major depressive disorder, recurrent, moderate: Secondary | ICD-10-CM

## 2023-07-18 ENCOUNTER — Ambulatory Visit (INDEPENDENT_AMBULATORY_CARE_PROVIDER_SITE_OTHER): Payer: BC Managed Care – PPO | Admitting: Gastroenterology

## 2023-07-18 ENCOUNTER — Other Ambulatory Visit: Payer: BC Managed Care – PPO

## 2023-07-18 ENCOUNTER — Encounter: Payer: Self-pay | Admitting: Gastroenterology

## 2023-07-18 VITALS — BP 146/82 | HR 89 | Ht 62.0 in | Wt 161.0 lb

## 2023-07-18 DIAGNOSIS — D509 Iron deficiency anemia, unspecified: Secondary | ICD-10-CM | POA: Diagnosis not present

## 2023-07-18 DIAGNOSIS — K219 Gastro-esophageal reflux disease without esophagitis: Secondary | ICD-10-CM | POA: Diagnosis not present

## 2023-07-18 DIAGNOSIS — R1013 Epigastric pain: Secondary | ICD-10-CM

## 2023-07-18 DIAGNOSIS — Z8719 Personal history of other diseases of the digestive system: Secondary | ICD-10-CM

## 2023-07-18 DIAGNOSIS — Z1211 Encounter for screening for malignant neoplasm of colon: Secondary | ICD-10-CM

## 2023-07-18 LAB — CBC WITH DIFFERENTIAL/PLATELET
Basophils Absolute: 0.1 10*3/uL (ref 0.0–0.1)
Basophils Relative: 1 % (ref 0.0–3.0)
Eosinophils Absolute: 0.4 10*3/uL (ref 0.0–0.7)
Eosinophils Relative: 5.9 % — ABNORMAL HIGH (ref 0.0–5.0)
HCT: 32.3 % — ABNORMAL LOW (ref 36.0–46.0)
Hemoglobin: 10.5 g/dL — ABNORMAL LOW (ref 12.0–15.0)
Lymphocytes Relative: 22.9 % (ref 12.0–46.0)
Lymphs Abs: 1.4 10*3/uL (ref 0.7–4.0)
MCHC: 32.4 g/dL (ref 30.0–36.0)
MCV: 92 fL (ref 78.0–100.0)
Monocytes Absolute: 0.5 10*3/uL (ref 0.1–1.0)
Monocytes Relative: 7.6 % (ref 3.0–12.0)
Neutro Abs: 3.8 10*3/uL (ref 1.4–7.7)
Neutrophils Relative %: 62.6 % (ref 43.0–77.0)
Platelets: 423 10*3/uL — ABNORMAL HIGH (ref 150.0–400.0)
RBC: 3.51 Mil/uL — ABNORMAL LOW (ref 3.87–5.11)
RDW: 17.6 % — ABNORMAL HIGH (ref 11.5–15.5)
WBC: 6.1 10*3/uL (ref 4.0–10.5)

## 2023-07-18 LAB — COMPREHENSIVE METABOLIC PANEL
ALT: 15 U/L (ref 0–35)
AST: 15 U/L (ref 0–37)
Albumin: 4.6 g/dL (ref 3.5–5.2)
Alkaline Phosphatase: 50 U/L (ref 39–117)
BUN: 21 mg/dL (ref 6–23)
CO2: 23 meq/L (ref 19–32)
Calcium: 9.7 mg/dL (ref 8.4–10.5)
Chloride: 106 meq/L (ref 96–112)
Creatinine, Ser: 0.88 mg/dL (ref 0.40–1.20)
GFR: 74.95 mL/min (ref >60.00)
Glucose, Bld: 94 mg/dL (ref 70–99)
Potassium: 4.2 meq/L (ref 3.5–5.1)
Sodium: 139 meq/L (ref 135–145)
Total Bilirubin: 0.3 mg/dL (ref 0.2–1.2)
Total Protein: 6.9 g/dL (ref 6.0–8.3)

## 2023-07-18 LAB — IBC + FERRITIN
Ferritin: 41.3 ng/mL (ref 10.0–291.0)
Iron: 121 ug/dL (ref 42–145)
Saturation Ratios: 21.8 % (ref 20.0–50.0)
TIBC: 555.8 ug/dL — ABNORMAL HIGH (ref 250.0–450.0)
Transferrin: 397 mg/dL — ABNORMAL HIGH (ref 212.0–360.0)

## 2023-07-18 MED ORDER — NA SULFATE-K SULFATE-MG SULF 17.5-3.13-1.6 GM/177ML PO SOLN: ORAL | 0 refills | Status: DC

## 2023-07-18 NOTE — Progress Notes (Addendum)
Chief Complaint: GERD, epigastric pain Primary GI MD: Gentry Fitz  HPI: 52 year old female history of sarcoidosis (on methotrexate), spinal stenosis s/p spinal stimulator and postlaminectomy syndrome, presents for evaluation of GERD and epigastric pain  LABS Hb: 10.8 g/dL (05/6577) Ferritin: 22 ng/mL (04/2023) Iron: 61 g/dL (46/9629) Iron saturation: 11% (04/2023)  Discussed the use of AI scribe software for clinical note transcription with the patient, who gave verbal consent to proceed.  History of Present Illness   The patient, with a history of sarcoidosis, kidney stones, appendectomy, presents with new onset anemia and abdominal discomfort. The anemia was discovered on routine labs in July, with a hemoglobin of 10.8, ferritin of 22, and iron of 61 with a saturation of 11%. The patient denies a history of anemia and is unsure if it is related to their recent kidney issues, including two kidney stone surgeries in the last seven months.  The abdominal discomfort is described as a feeling of fullness and belching air, particularly after eating or drinking. Occasionally, they experience a burning sensation, but deny reflux. The discomfort is described as a hard knot in the chest that comes and goes, not necessarily related to eating. They report feeling hungry, but not having an appetite. Eating temporarily relieves the discomfort, but it returns about 30 minutes later. On pantoprazole 40mg  once daily with some relief.  The patient is currently on methotrexate for sarcoidosis, which has caused enlarged lymph nodes in the retroperitoneum, spleen, and head. They have been on this treatment for almost five years.   They also report taking ibuprofen and goody powders for pain management related to a history of back problems and a spinal stimulator implant. They estimate taking four tablets of ibuprofen three times a day (800mg  TID) and two goody powders twice a day for several months. They deny  any blood in their stool, but note that their stools have been dark since starting iron supplementation. They have never had a colonoscopy and deny any family history of colon cancer.      Past Medical History:  Diagnosis Date   Age-related osteoporosis without current pathological fracture    Anxiety    Arthritis    Asthma    Atrophy of thyroid (acquired)    Depression    Drug-induced myopathy    Gastro-esophageal reflux disease without esophagitis    Hypothyroidism    Lumbar spinal stenosis    Mixed hyperlipidemia    Polyarthralgia    Sarcoidosis of other sites     Past Surgical History:  Procedure Laterality Date   ABDOMINAL HYSTERECTOMY  1999   Total   BACK SURGERY  2005   Dr. Ronald Lobo EAR SURGERY     LUMBAR LAMINECTOMY/DECOMPRESSION MICRODISCECTOMY N/A 06/21/2017   Procedure: Lumbar decompression L3-4 ;  Surgeon: Venita Lick, MD;  Location: [facility] OR;  Service: Orthopedics;  Laterality: N/A;  3 hrs   SPINAL CORD STIMULATOR INSERTION N/A 03/13/2019   Procedure: LUMBAR SPINAL CORD STIMULATOR INSERTION;  Surgeon: Venita Lick, MD;  Location: [facility] OR;  Service: Orthopedics;  Laterality: N/A;   TONSILLECTOMY      Current Outpatient Medications  Medication Sig Dispense Refill   acetaminophen (TYLENOL) 500 MG tablet Take 1,000 mg by mouth every 6 (six) hours as needed for moderate pain or headache.     albuterol (PROAIR HFA) 108 (90 Base) MCG/ACT inhaler Inhale 1-2 puffs into the lungs every 6 (six) hours as needed. 1 each 5   alendronate (FOSAMAX) 70 MG tablet TAKE 1  TABLET BY MOUTH  EVERY TUESDAY TAKE WITH A  FULL GLASS OF WATER ON AN  EMPTY STOMACH 12 tablet 3   buPROPion (WELLBUTRIN XL) 300 MG 24 hr tablet TAKE 1 TABLET BY MOUTH DAILY 90 tablet 1   ezetimibe (ZETIA) 10 MG tablet Take 1 tablet (10 mg total) by mouth daily. 30 tablet 1   fenofibrate 160 MG tablet TAKE 1 TABLET BY MOUTH DAILY 90 tablet 3   folic acid (FOLVITE) 1 MG tablet Take 1 mg by mouth daily.      ibuprofen (ADVIL) 200 MG tablet Take 200 mg by mouth every 6 (six) hours as needed.     levothyroxine (SYNTHROID) 50 MCG tablet Take 1 tablet (50 mcg total) by mouth daily before breakfast. 90 tablet 0   lisinopril (ZESTRIL) 10 MG tablet TAKE 1 TABLET BY MOUTH DAILY 90 tablet 1   LORazepam (ATIVAN) 0.5 MG tablet Take 1 tablet (0.5 mg total) by mouth daily as needed for anxiety. 30 tablet 1   methocarbamol (ROBAXIN) 750 MG tablet TAKE 2 TABLETS BY MOUTH 3 TIMES  DAILY 90 tablet 1   Methotrexate Sodium (METHOTREXATE, PF,) 50 MG/2ML injection      ondansetron (ZOFRAN) 4 MG tablet Take by mouth.     pantoprazole (PROTONIX) 40 MG tablet Take 1 tablet (40 mg total) by mouth every evening. 90 tablet 3   sertraline (ZOLOFT) 100 MG tablet TAKE 1 TABLET BY MOUTH DAILY 90 tablet 1   TUBERCULIN SYR 1CC/27GX1/2" (B-D TB SYRINGE 1CC/27GX1/2") 27G X 1/2" 1 ML MISC USE AS DIRECTED WITH  METHOTREXATE EVERY 7 DAYS     No current facility-administered medications for this visit.    Allergies as of 07/18/2023 - Review Complete 07/18/2023  Allergen Reaction Noted   Other Itching 05/11/2017   Tape Other (See Comments) and Itching 05/11/2017   Crestor [rosuvastatin] Other (See Comments) 12/30/2019   Lipitor [atorvastatin] Other (See Comments) 12/30/2019   Percocet [oxycodone-acetaminophen] Itching and Nausea Only 05/11/2017    Family History  Problem Relation Age of Onset   Hypertension Mother    Cancer - Lung Father     Social History   Socioeconomic History   Marital status: Married    Spouse name: Not on file   Number of children: 4   Years of education: Not on file   Highest education level: Not on file  Occupational History   Not on file  Tobacco Use   Smoking status: Never   Smokeless tobacco: Never  Vaping Use   Vaping status: Never Used  Substance and Sexual Activity   Alcohol use: No   Drug use: No   Sexual activity: Not on file  Other Topics Concern   Not on file  Social History  Narrative   Not on file   Social Determinants of Health   Financial Resource Strain: Low Risk  (04/24/2023)   Overall Financial Resource Strain (CARDIA)    Difficulty of Paying Living Expenses: Not hard at all  Food Insecurity: No Food Insecurity (04/24/2023)   Hunger Vital Sign    Worried About Running Out of Food in the Last Year: Never true    Ran Out of Food in the Last Year: Never true  Transportation Needs: No Transportation Needs (04/24/2023)   PRAPARE - Administrator, Civil Service (Medical): No    Lack of Transportation (Non-Medical): No  Physical Activity: Inactive (04/24/2023)   Exercise Vital Sign    Days of Exercise per Week: 0 days  Minutes of Exercise per Session: 0 min  Stress: No Stress Concern Present (04/24/2023)   [employer] of Occupational Health - Occupational Stress Questionnaire    Feeling of Stress : Not at all  Social Connections: Moderately Isolated (04/24/2023)   Social Connection and Isolation Panel [NHANES]    Frequency of Communication with Friends and Family: More than three times a week    Frequency of Social Gatherings with Friends and Family: More than three times a week    Attends Religious Services: Never    Database administrator or Organizations: No    Attends Banker Meetings: Never    Marital Status: Married  Catering manager Violence: Not At Risk (04/24/2023)   Humiliation, Afraid, Rape, and Kick questionnaire    Fear of Current or Ex-Partner: No    Emotionally Abused: No    Physically Abused: No    Sexually Abused: No    Review of Systems:    Constitutional: No weight loss, fever, chills, weakness or fatigue HEENT: Eyes: No change in vision               Ears, Nose, Throat:  No change in hearing or congestion Skin: No rash or itching Cardiovascular: No chest pain, chest pressure or palpitations   Respiratory: No SOB or cough Gastrointestinal: See HPI and otherwise negative Genitourinary: No dysuria or  change in urinary frequency Neurological: No headache, dizziness or syncope Musculoskeletal: No new muscle or joint pain Hematologic: No bleeding or bruising Psychiatric: No history of depression or anxiety    Physical Exam:  Vital signs: BP (!) 146/82   Pulse 89   Ht 5\' 2"  (1.575 m)   Wt 73 kg   LMP 10/09/1997 (Within Months) Comment: hysterectomy 1999  BMI 29.45 kg/m   Constitutional: NAD, Well developed, Well nourished, alert and cooperative Head:  Normocephalic and atraumatic. Eyes:   PEERL, EOMI. No icterus. Conjunctiva pink. Respiratory: Respirations even and unlabored. Lungs clear to auscultation bilaterally.   No wheezes, crackles, or rhonchi.  Cardiovascular:  Regular rate and rhythm. No peripheral edema, cyanosis or pallor.  Gastrointestinal:  Soft, nondistended, mild tenderness. No rebound or guarding. Normal bowel sounds. No appreciable masses or hepatomegaly. Rectal:  Not performed.  Msk:  Symmetrical without gross deformities. Without edema, no deformity or joint abnormality.  Neurologic:  Alert and  oriented x4;  grossly normal neurologically.  Skin:   Dry and intact without significant lesions or rashes. Psychiatric: Oriented to person, place and time. Demonstrates good judgement and reason without abnormal affect or behaviors.   RELEVANT LABS AND IMAGING: CBC    Component Value Date/Time   WBC 6.2 04/24/2023 1105   WBC 7.5 04/23/2019 0557   RBC 3.68 (L) 04/24/2023 1105   RBC 4.44 04/23/2019 0557   HGB 10.7 (L) 04/24/2023 1105   HCT 32.9 (L) 04/24/2023 1105   PLT 480 (H) 04/24/2023 1105   MCV 89 04/24/2023 1105   MCH 29.1 04/24/2023 1105   MCH 28.8 04/23/2019 0557   MCHC 32.5 04/24/2023 1105   MCHC 32.5 04/23/2019 0557   RDW 14.5 04/24/2023 1105   LYMPHSABS 1.2 04/24/2023 1105   MONOABS 0.4 01/07/2019 1402   EOSABS 0.3 04/24/2023 1105   BASOSABS 0.1 04/24/2023 1105    CMP     Component Value Date/Time   NA 140 04/24/2023 1105   K 4.8 04/24/2023  1105   CL 104 04/24/2023 1105   CO2 21 04/24/2023 1105   GLUCOSE 92 04/24/2023 1105  GLUCOSE 96 03/11/2019 1455   BUN 17 04/24/2023 1105   CREATININE 1.15 (H) 04/24/2023 1105   CREATININE 0.93 01/07/2019 1402   CALCIUM 10.0 04/24/2023 1105   CALCIUM 9.7 01/07/2019 1459   PROT 7.0 04/24/2023 1105   ALBUMIN 4.6 04/24/2023 1105   AST 23 04/24/2023 1105   AST 21 01/07/2019 1402   ALT 20 04/24/2023 1105   ALT 20 01/07/2019 1402   ALKPHOS 55 04/24/2023 1105   BILITOT 0.3 04/24/2023 1105   BILITOT 0.2 (L) 01/07/2019 1402   GFRNONAA 76 07/14/2020 0846   GFRNONAA >60 01/07/2019 1402   GFRAA 87 07/14/2020 0846   GFRAA >60 01/07/2019 1402     Assessment/Plan:      Upper GI symptoms Epigastric discomfort, fullness, and belching, particularly after eating. No reflux symptoms. Symptoms have been worsening over the past year. Patient has a history of sarcoidosis and is on methotrexate. Also, significant NSAID use (Goody powders two packets twice daily and ibuprofen 800mg  4 times daily).  -Schedule for endoscopy to evaluate for possible peptic ulcer disease, gastritis, esophagitis. -Continue Protonix 40mg  daily until endoscopy, pending results will increase to BID. -Discontinue Goody powders and replace with Tylenol as needed for pain. - avoid NSAIDs - I thoroughly discussed the procedure with the patient (at bedside) to include nature of the procedure, alternatives, benefits, and risks (including but not limited to bleeding, infection, perforation, anesthesia/cardiac pulmonary complications).  Patient verbalized understanding and gave verbal consent to proceed with procedure.   Anemia Hemoglobin 10.8, ferritin 22, iron 61, saturation 11%. New onset, possibly related to upper GI symptoms and NSAID use. - CBC, CMP, iron studies -Endoscopy will also evaluate for possible upper GI bleeding source. No overt bleeding at this time  Chronic NSAID use Patient taking significant amounts of Goody  powders and ibuprofen, which can cause gastric ulcers and kidney damage. -Discontinue Goody powders and replace with Tylenol as needed for pain. -Reduce ibuprofen use as much as possible.  Colon cancer screening Patient has not had a colonoscopy. -Schedule for colonoscopy.  Sarcoidosis Patient has a history of sarcoidosis with involvement of retroperitoneal lymph nodes, spleen, eye, and head. On methotrexate injections once a week. -Continue current management.  Chronic pain Patient has a history of back surgery and spinal stimulator placement. Currently using ibuprofen and Goody powders for pain management. -Consider referral back to pain clinic to avoid chronic NSAIDs     Lara Mulch [facility] 07/18/2023, 2:51 PM  Cc: Marianne Sofia, PA-C

## 2023-07-18 NOTE — Patient Instructions (Addendum)
You have been scheduled for an endoscopy and colonoscopy. Please follow the written instructions given to you at your visit today.  Please pick up your prep supplies at the pharmacy within the next 1-3 days.  If you use inhalers (even only as needed), please bring them with you on the day of your procedure.  DO NOT TAKE 7 DAYS PRIOR TO TEST- Trulicity (dulaglutide) Ozempic, Wegovy (semaglutide) Mounjaro (tirzepatide) Bydureon Bcise (exanatide extended release)  DO NOT TAKE 1 DAY PRIOR TO YOUR TEST Rybelsus (semaglutide) Adlyxin (lixisenatide) Victoza (liraglutide) Byetta (exanatide) ___________________________________________________________________________   We have sent the following medications to your pharmacy for you to pick up at your convenience: Suprep  _______________________________________________________  If your blood pressure at your visit was 140/90 or greater, please contact your primary care physician to follow up on this.  _______________________________________________________  If you are age 83 or older, your body mass index should be between 23-30. Your Body mass index is 29.45 kg/m. If this is out of the aforementioned range listed, please consider follow up with your Primary Care Provider.  If you are age 75 or younger, your body mass index should be between 19-25. Your Body mass index is 29.45 kg/m. If this is out of the aformentioned range listed, please consider follow up with your Primary Care Provider.   ________________________________________________________  The [facility] providers would like to encourage you to use [facility] to communicate with providers for non-urgent requests or questions.  Due to long hold times on the telephone, sending your provider a message by [facility] may be a faster and more efficient way to get a response.  Please allow 48 business hours for a response.  Please remember that this is for non-urgent requests.   _______________________________________________________  Due to recent changes in healthcare laws, you may see the results of your imaging and laboratory studies on MyChart before your provider has had a chance to review them.  We understand that in some cases there may be results that are confusing or concerning to you. Not all laboratory results come back in the same time frame and the provider may be waiting for multiple results in order to interpret others.  Please give [place] 48 hours in order for your provider to thoroughly review all the results before contacting the office for clarification of your results.   Thank you for entrusting me with your care and choosing [facility].  Bayley Leanna Sato, PA-C

## 2023-07-19 NOTE — Progress Notes (Signed)
Attending Physician's Attestation   I have reviewed the chart.   I agree with the Advanced Practitioner's note, impression, and recommendations with any updates as below.    Emersyn Wyss Mansouraty, MD [facility] Advanced Endoscopy Office # [phone]  

## 2023-08-01 DIAGNOSIS — D8689 Sarcoidosis of other sites: Secondary | ICD-10-CM | POA: Diagnosis not present

## 2023-08-01 DIAGNOSIS — D869 Sarcoidosis, unspecified: Secondary | ICD-10-CM | POA: Diagnosis not present

## 2023-08-01 DIAGNOSIS — Z79899 Other long term (current) drug therapy: Secondary | ICD-10-CM | POA: Diagnosis not present

## 2023-08-01 DIAGNOSIS — M810 Age-related osteoporosis without current pathological fracture: Secondary | ICD-10-CM | POA: Diagnosis not present

## 2023-08-20 ENCOUNTER — Other Ambulatory Visit: Payer: Self-pay | Admitting: Physician Assistant

## 2023-08-26 ENCOUNTER — Other Ambulatory Visit: Payer: Self-pay | Admitting: Physician Assistant

## 2023-09-04 ENCOUNTER — Ambulatory Visit (INDEPENDENT_AMBULATORY_CARE_PROVIDER_SITE_OTHER): Payer: BC Managed Care – PPO

## 2023-09-04 DIAGNOSIS — Z23 Encounter for immunization: Secondary | ICD-10-CM | POA: Diagnosis not present

## 2023-09-05 ENCOUNTER — Ambulatory Visit: Payer: BC Managed Care – PPO

## 2023-10-02 ENCOUNTER — Ambulatory Visit: Payer: BC Managed Care – PPO | Admitting: Gastroenterology

## 2023-10-02 ENCOUNTER — Encounter: Payer: Self-pay | Admitting: Gastroenterology

## 2023-10-02 VITALS — BP 107/66 | HR 68 | Temp 98.3°F | Resp 13 | Ht 62.0 in | Wt 161.0 lb

## 2023-10-02 DIAGNOSIS — K2289 Other specified disease of esophagus: Secondary | ICD-10-CM | POA: Diagnosis not present

## 2023-10-02 DIAGNOSIS — K2980 Duodenitis without bleeding: Secondary | ICD-10-CM | POA: Diagnosis not present

## 2023-10-02 DIAGNOSIS — K259 Gastric ulcer, unspecified as acute or chronic, without hemorrhage or perforation: Secondary | ICD-10-CM | POA: Diagnosis not present

## 2023-10-02 DIAGNOSIS — K641 Second degree hemorrhoids: Secondary | ICD-10-CM

## 2023-10-02 DIAGNOSIS — Z1211 Encounter for screening for malignant neoplasm of colon: Secondary | ICD-10-CM

## 2023-10-02 DIAGNOSIS — K2990 Gastroduodenitis, unspecified, without bleeding: Secondary | ICD-10-CM

## 2023-10-02 DIAGNOSIS — R1013 Epigastric pain: Secondary | ICD-10-CM

## 2023-10-02 DIAGNOSIS — D509 Iron deficiency anemia, unspecified: Secondary | ICD-10-CM

## 2023-10-02 DIAGNOSIS — K209 Esophagitis, unspecified without bleeding: Secondary | ICD-10-CM

## 2023-10-02 MED ORDER — PANTOPRAZOLE SODIUM 40 MG PO TBEC: 40.0000 mg | DELAYED_RELEASE_TABLET | Freq: Two times a day (BID) | ORAL | 4 refills | Status: DC

## 2023-10-02 MED ORDER — SODIUM CHLORIDE 0.9 % IV SOLN: 500.0000 mL | Freq: Once | INTRAVENOUS | Status: DC

## 2023-10-02 NOTE — Progress Notes (Unsigned)
Sedate, gd SR, tolerated procedure well, VSS, report to RN 

## 2023-10-02 NOTE — Progress Notes (Unsigned)
Reviewed Medical/Surgical history with patient. VS assessed by D.T

## 2023-10-02 NOTE — Op Note (Addendum)
[facility] Patient Name: Bonnie Parker Procedure Date: 10/02/2023 7:19 AM MRN: [id number] Endoscopist: Corliss Parish , MD, [id number] Age: 52 Referring MD:  Date of Birth: 07-13-1971 Gender: Female Account #: [id number] Procedure:                Colonoscopy Indications:              Screening for colorectal malignant neoplasm Medicines:                Monitored Anesthesia Care Procedure:                Pre-Anesthesia Assessment:                           - Prior to the procedure, a History and Physical                            was performed, and patient medications and                            allergies were reviewed. The patient's tolerance of                            previous anesthesia was also reviewed. The risks                            and benefits of the procedure and the sedation                            options and risks were discussed with the patient.                            All questions were answered, and informed consent                            was obtained. Prior Anticoagulants: The patient has                            taken no anticoagulant or antiplatelet agents                            except for NSAID medication. ASA Grade Assessment:                            II - A patient with mild systemic disease. After                            reviewing the risks and benefits, the patient was                            deemed in satisfactory condition to undergo the                            procedure.  After obtaining informed consent, the colonoscope                            was passed under direct vision. Throughout the                            procedure, the patient's blood pressure, pulse, and                            oxygen saturations were monitored continuously. The                            Olympus Scope SN [id number] was introduced through the                            anus and advanced to the 3  cm into the ileum. The                            colonoscopy was performed without difficulty. The                            patient tolerated the procedure. The quality of the                            bowel preparation was good. The terminal ileum,                            ileocecal valve, appendiceal orifice, and rectum                            were photographed. Scope In: 8:11:14 AM Scope Out: 8:19:29 AM Scope Withdrawal Time: 0 hours 5 minutes 46 seconds  Total Procedure Duration: 0 hours 8 minutes 15 seconds  Findings:                 The digital rectal exam findings include                            hemorrhoids. Pertinent negatives include no                            palpable rectal lesions.                           Normal mucosa was found in the entire colon.                           Non-bleeding non-thrombosed internal hemorrhoids                            were found during retroflexion, during perianal                            exam and during digital exam. The hemorrhoids were  Grade II (internal hemorrhoids that prolapse but                            reduce spontaneously). Complications:            No immediate complications. Estimated Blood Loss:     Estimated blood loss: none. Impression:               - Hemorrhoids found on digital rectal exam.                           - Normal mucosa in the entire examined colon.                           - Non-bleeding non-thrombosed internal hemorrhoids. Recommendation:           - The patient will be observed post-procedure,                            until all discharge criteria are met.                           - Discharge patient to home.                           - Patient has a contact number available for                            emergencies. The signs and symptoms of potential                            delayed complications were discussed with the                            patient.  Return to normal activities tomorrow.                            Written discharge instructions were provided to the                            patient.                           - High fiber diet.                           - Use FiberCon 1-2 tablets PO daily.                           - Continue present medications.                           - Repeat colonoscopy in 10 years for screening                            purposes.                           -  The findings and recommendations were discussed                            with the patient.                           - The findings and recommendations were discussed                            with the patient's family. Corliss Parish, MD 10/02/2023 8:30:36 AM

## 2023-10-02 NOTE — Progress Notes (Signed)
Unable to schedule endoscopy follow up with Dr. Meridee Score.  Note placed on pts AVS with call back information to schedule endoscopy follow up.  Pt and family member at bedside aware and verbalized understanding.  3-4 month EGD recall placed by Durene Romans

## 2023-10-02 NOTE — Patient Instructions (Addendum)
**Patient needs a repeat EGD in 3-4 months per MD. His schedule was not out this far so a recall was made instead.**  Protonix increases to 40 mg twice a day for 3-4 months  Await biopsy results from upper Endoscopy  High fiber diet   Use FiberCon 1-2 tabs daily       YOU HAD AN ENDOSCOPIC PROCEDURE TODAY AT THE [facility]:   Refer to the procedure report that was given to you for any specific questions about what was found during the examination.  If the procedure report does not answer your questions, please call your gastroenterologist to clarify.  If you requested that your care partner not be given the details of your procedure findings, then the procedure report has been included in a sealed envelope for you to review at your convenience later.  YOU SHOULD EXPECT: Some feelings of bloating in the abdomen. Passage of more gas than usual.  Walking can help get rid of the air that was put into your GI tract during the procedure and reduce the bloating. If you had a lower endoscopy (such as a colonoscopy or flexible sigmoidoscopy) you may notice spotting of blood in your stool or on the toilet paper. If you underwent a bowel prep for your procedure, you may not have a normal bowel movement for a few days.  Please Note:  You might notice some irritation and congestion in your nose or some drainage.  This is from the oxygen used during your procedure.  There is no need for concern and it should clear up in a day or so.  SYMPTOMS TO REPORT IMMEDIATELY:  Following lower endoscopy (colonoscopy or flexible sigmoidoscopy):  Excessive amounts of blood in the stool  Significant tenderness or worsening of abdominal pains  Swelling of the abdomen that is new, acute  Fever of 100F or higher  Following upper endoscopy (EGD)  Vomiting of blood or coffee ground material  New chest pain or pain under the shoulder blades  Painful or persistently difficult swallowing  New  shortness of breath  Fever of 100F or higher  Black, tarry-looking stools  For urgent or emergent issues, a gastroenterologist can be reached at any hour by calling [phone]. Do not use MyChart messaging for urgent concerns.    DIET:  We do recommend a small meal at first, but then you may proceed to your regular diet.  Drink plenty of fluids but you should avoid alcoholic beverages for 24 hours.  ACTIVITY:  You should plan to take it easy for the rest of today and you should NOT DRIVE or use heavy machinery until tomorrow (because of the sedation medicines used during the test).    FOLLOW UP: Our staff will call the number listed on your records the next business day following your procedure.  We will call around 7:15- 8:00 am to check on you and address any questions or concerns that you may have regarding the information given to you following your procedure. If we do not reach you, we will leave a message.     If any biopsies were taken you will be contacted by phone or by letter within the next 1-3 weeks.  Please call us at [phone] if you have not heard about the biopsies in 3 weeks.    SIGNATURES/CONFIDENTIALITY: You and/or your care partner have signed paperwork which will be entered into your electronic medical record.  These signatures attest to the fact that that the  information above on your After Visit Summary has been reviewed and is understood.  Full responsibility of the confidentiality of this discharge information lies with you and/or your care-partner.

## 2023-10-02 NOTE — Progress Notes (Signed)
Called to room to assist during endoscopic procedure.  Patient ID and intended procedure confirmed with present staff. Received instructions for my participation in the procedure from the performing physician.  

## 2023-10-02 NOTE — Op Note (Signed)
[facility] Patient Name: Bonnie Parker Procedure Date: 10/02/2023 7:20 AM MRN: [id number] Endoscopist: Corliss Parish , MD, [id number] Age: 52 Referring MD:  Date of Birth: 26-May-1971 Gender: Female Account #: [id number] Procedure:                Upper GI endoscopy Indications:              Epigastric abdominal pain, Periumbilical abdominal                            pain, Iron deficiency, Dyspepsia Medicines:                Monitored Anesthesia Care Procedure:                Pre-Anesthesia Assessment:                           - Prior to the procedure, a History and Physical                            was performed, and patient medications and                            allergies were reviewed. The patient's tolerance of                            previous anesthesia was also reviewed. The risks                            and benefits of the procedure and the sedation                            options and risks were discussed with the patient.                            All questions were answered, and informed consent                            was obtained. Prior Anticoagulants: The patient has                            taken no anticoagulant or antiplatelet agents                            except for NSAID medication. ASA Grade Assessment:                            II - A patient with mild systemic disease. After                            reviewing the risks and benefits, the patient was                            deemed in satisfactory condition to undergo the  procedure.                           After obtaining informed consent, the endoscope was                            passed under direct vision. Throughout the                            procedure, the patient's blood pressure, pulse, and                            oxygen saturations were monitored continuously. The                            Olympus scope [id number] was  introduced through the                            mouth, and advanced to the second part of duodenum.                            The upper GI endoscopy was accomplished without                            difficulty. The patient tolerated the procedure. Scope In: Scope Out: Findings:                 No gross lesions were noted in the proximal                            esophagus and in the mid esophagus.                           LA Grade A (one or more mucosal breaks less than 5                            mm, not extending between tops of 2 mucosal folds)                            esophagitis with no bleeding was found in the                            distal esophagus.                           The Z-line was irregular and was found 38 cm from                            the incisors.                           One non-bleeding superficial gastric ulcer with a                            clean ulcer base (Forrest  Class III) was found in                            the gastric antrum. The lesion was 12 mm in largest                            dimension.                           Patchy moderate inflammation characterized by                            erosions and erythema was found in the entire                            examined stomach. Biopsies were taken with a cold                            forceps for histology and Helicobacter pylori                            testing.                           No gross lesions were noted in the duodenal bulb,                            in the first portion of the duodenum and in the                            second portion of the duodenum. Biopsies for                            histology were taken with a cold forceps for                            evaluation of celiac disease. Complications:            No immediate complications. Estimated Blood Loss:     Estimated blood loss was minimal. Impression:               - No gross lesions in the  proximal esophagus and in                            the mid esophagus. LA Grade A esophagitis with no                            bleeding found distally.                           - Z-line irregular, 38 cm from the incisors.                           - Non-bleeding gastric ulcer with a clean ulcer  base (Forrest Class III) in the antrum.                           - Gastritis. Biopsied.                           - No gross lesions in the duodenal bulb, in the                            first portion of the duodenum and in the second                            portion of the duodenum. Biopsied. Recommendation:           - Proceed to scheduled colonoscopy.                           - Increase PPI to 40 mg twice daily for next 3 to 4                            months.                           - Await pathology results.                           - Observe patient's clinical course.                           - Repeat upper endoscopy in 3 to 4 months to ensure                            healing of gastric ulcer.                           - The findings and recommendations were discussed                            with the patient.                           - The findings and recommendations were discussed                            with the patient's family. Corliss Parish, MD 10/02/2023 8:28:31 AM

## 2023-10-02 NOTE — Progress Notes (Unsigned)
GASTROENTEROLOGY PROCEDURE H&P NOTE   Primary Care Physician: Marianne Sofia, PA-C  HPI: Bonnie Parker is a 52 y.o. female who presents for EGD/Colonoscopy for evaluation of abdominal pain and IDA and colon cancer screening.  Past Medical History:  Diagnosis Date   Age-related osteoporosis without current pathological fracture    Allergy    Anxiety    Arthritis    Asthma    Atrophy of thyroid (acquired)    Chronic kidney disease    Depression    Drug-induced myopathy    Gastro-esophageal reflux disease without esophagitis    Hypertension    Hypothyroidism    Lumbar spinal stenosis    Mixed hyperlipidemia    Polyarthralgia    Sarcoidosis of other sites    Past Surgical History:  Procedure Laterality Date   ABDOMINAL HYSTERECTOMY  1999   Total   APPENDECTOMY     BACK SURGERY  2005   Dr. Ronald Lobo EAR SURGERY     LUMBAR LAMINECTOMY/DECOMPRESSION MICRODISCECTOMY N/A 06/21/2017   Procedure: Lumbar decompression L3-4 ;  Surgeon: Venita Lick, MD;  Location: [facility] OR;  Service: Orthopedics;  Laterality: N/A;  3 hrs   SPINAL CORD STIMULATOR INSERTION N/A 03/13/2019   Procedure: LUMBAR SPINAL CORD STIMULATOR INSERTION;  Surgeon: Venita Lick, MD;  Location: [facility] OR;  Service: Orthopedics;  Laterality: N/A;   TONSILLECTOMY     Current Outpatient Medications  Medication Sig Dispense Refill   acetaminophen (TYLENOL) 500 MG tablet Take 1,000 mg by mouth every 6 (six) hours as needed for moderate pain or headache.     albuterol (PROAIR HFA) 108 (90 Base) MCG/ACT inhaler Inhale 1-2 puffs into the lungs every 6 (six) hours as needed. 1 each 5   buPROPion (WELLBUTRIN XL) 300 MG 24 hr tablet TAKE 1 TABLET BY MOUTH DAILY 90 tablet 1   ezetimibe (ZETIA) 10 MG tablet Take 1 tablet (10 mg total) by mouth daily. 30 tablet 1   fenofibrate 160 MG tablet TAKE 1 TABLET BY MOUTH DAILY 90 tablet 0   folic acid (FOLVITE) 1 MG tablet Take 1 mg by mouth daily.     levothyroxine (SYNTHROID) 50  MCG tablet TAKE 1 TABLET BY MOUTH DAILY  BEFORE BREAKFAST 90 tablet 0   lisinopril (ZESTRIL) 10 MG tablet TAKE 1 TABLET BY MOUTH DAILY 90 tablet 1   methocarbamol (ROBAXIN) 750 MG tablet TAKE 2 TABLETS BY MOUTH 3 TIMES  DAILY 90 tablet 1   Methotrexate Sodium (METHOTREXATE, PF,) 50 MG/2ML injection      pantoprazole (PROTONIX) 40 MG tablet TAKE 1 TABLET BY MOUTH IN THE  EVENING 90 tablet 0   sertraline (ZOLOFT) 100 MG tablet TAKE 1 TABLET BY MOUTH DAILY 90 tablet 1   alendronate (FOSAMAX) 70 MG tablet TAKE 1 TABLET BY MOUTH  EVERY TUESDAY TAKE WITH A  FULL GLASS OF WATER ON AN  EMPTY STOMACH (Patient not taking: Reported on 10/02/2023) 12 tablet 3   ibuprofen (ADVIL) 200 MG tablet Take 200 mg by mouth every 6 (six) hours as needed. (Patient not taking: Reported on 10/02/2023)     LORazepam (ATIVAN) 0.5 MG tablet Take 1 tablet (0.5 mg total) by mouth daily as needed for anxiety. (Patient not taking: Reported on 10/02/2023) 30 tablet 1   ondansetron (ZOFRAN) 4 MG tablet Take by mouth. (Patient not taking: Reported on 10/02/2023)     Current Facility-Administered Medications  Medication Dose Route Frequency Provider Last Rate Last Admin   0.9 %  sodium  chloride infusion  500 mL Intravenous Once Mansouraty, Netty Starring., MD        Current Outpatient Medications:    acetaminophen (TYLENOL) 500 MG tablet, Take 1,000 mg by mouth every 6 (six) hours as needed for moderate pain or headache., Disp: , Rfl:    albuterol (PROAIR HFA) 108 (90 Base) MCG/ACT inhaler, Inhale 1-2 puffs into the lungs every 6 (six) hours as needed., Disp: 1 each, Rfl: 5   buPROPion (WELLBUTRIN XL) 300 MG 24 hr tablet, TAKE 1 TABLET BY MOUTH DAILY, Disp: 90 tablet, Rfl: 1   ezetimibe (ZETIA) 10 MG tablet, Take 1 tablet (10 mg total) by mouth daily., Disp: 30 tablet, Rfl: 1   fenofibrate 160 MG tablet, TAKE 1 TABLET BY MOUTH DAILY, Disp: 90 tablet, Rfl: 0   folic acid (FOLVITE) 1 MG tablet, Take 1 mg by mouth daily., Disp: , Rfl:     levothyroxine (SYNTHROID) 50 MCG tablet, TAKE 1 TABLET BY MOUTH DAILY  BEFORE BREAKFAST, Disp: 90 tablet, Rfl: 0   lisinopril (ZESTRIL) 10 MG tablet, TAKE 1 TABLET BY MOUTH DAILY, Disp: 90 tablet, Rfl: 1   methocarbamol (ROBAXIN) 750 MG tablet, TAKE 2 TABLETS BY MOUTH 3 TIMES  DAILY, Disp: 90 tablet, Rfl: 1   Methotrexate Sodium (METHOTREXATE, PF,) 50 MG/2ML injection, , Disp: , Rfl:    pantoprazole (PROTONIX) 40 MG tablet, TAKE 1 TABLET BY MOUTH IN THE  EVENING, Disp: 90 tablet, Rfl: 0   sertraline (ZOLOFT) 100 MG tablet, TAKE 1 TABLET BY MOUTH DAILY, Disp: 90 tablet, Rfl: 1   alendronate (FOSAMAX) 70 MG tablet, TAKE 1 TABLET BY MOUTH  EVERY TUESDAY TAKE WITH A  FULL GLASS OF WATER ON AN  EMPTY STOMACH (Patient not taking: Reported on 10/02/2023), Disp: 12 tablet, Rfl: 3   ibuprofen (ADVIL) 200 MG tablet, Take 200 mg by mouth every 6 (six) hours as needed. (Patient not taking: Reported on 10/02/2023), Disp: , Rfl:    LORazepam (ATIVAN) 0.5 MG tablet, Take 1 tablet (0.5 mg total) by mouth daily as needed for anxiety. (Patient not taking: Reported on 10/02/2023), Disp: 30 tablet, Rfl: 1   ondansetron (ZOFRAN) 4 MG tablet, Take by mouth. (Patient not taking: Reported on 10/02/2023), Disp: , Rfl:   Current Facility-Administered Medications:    0.9 %  sodium chloride infusion, 500 mL, Intravenous, Once, Mansouraty, Netty Starring., MD Allergies  Allergen Reactions   Other Itching    blisters   Tape Other (See Comments) and Itching    blisters   Crestor [Rosuvastatin] Other (See Comments)    MYALGIAS, Muscle Aches   Lipitor [Atorvastatin] Other (See Comments)    MYALGIAS, Muscle Aches   Percocet [Oxycodone-Acetaminophen] Itching and Nausea Only   Family History  Problem Relation Age of Onset   Hypertension Mother    Cancer - Lung Father    Colon cancer Neg Hx    Esophageal cancer Neg Hx    Rectal cancer Neg Hx    Stomach cancer Neg Hx    Social History   Socioeconomic History   Marital  status: Married    Spouse name: Not on file   Number of children: 4   Years of education: Not on file   Highest education level: Not on file  Occupational History   Not on file  Tobacco Use   Smoking status: Never   Smokeless tobacco: Never  Vaping Use   Vaping status: Never Used  Substance and Sexual Activity   Alcohol use: No  Drug use: No   Sexual activity: Not on file  Other Topics Concern   Not on file  Social History Narrative   Not on file   Social Drivers of Health   Financial Resource Strain: Low Risk  (04/24/2023)   Overall Financial Resource Strain (CARDIA)    Difficulty of Paying Living Expenses: Not hard at all  Food Insecurity: No Food Insecurity (04/24/2023)   Hunger Vital Sign    Worried About Running Out of Food in the Last Year: Never true    Ran Out of Food in the Last Year: Never true  Transportation Needs: No Transportation Needs (04/24/2023)   PRAPARE - Administrator, Civil Service (Medical): No    Lack of Transportation (Non-Medical): No  Physical Activity: Inactive (04/24/2023)   Exercise Vital Sign    Days of Exercise per Week: 0 days    Minutes of Exercise per Session: 0 min  Stress: No Stress Concern Present (04/24/2023)   [employer] of Occupational Health - Occupational Stress Questionnaire    Feeling of Stress : Not at all  Social Connections: Moderately Isolated (04/24/2023)   Social Connection and Isolation Panel [NHANES]    Frequency of Communication with Friends and Family: More than three times a week    Frequency of Social Gatherings with Friends and Family: More than three times a week    Attends Religious Services: Never    Database administrator or Organizations: No    Attends Banker Meetings: Never    Marital Status: Married  Catering manager Violence: Not At Risk (04/24/2023)   Humiliation, Afraid, Rape, and Kick questionnaire    Fear of Current or Ex-Partner: No    Emotionally Abused: No     Physically Abused: No    Sexually Abused: No    Physical Exam: Today's Vitals   10/02/23 0720 10/02/23 0754 10/02/23 0755  BP: 124/76    Pulse: 82    Resp:  15 14  Temp: 98.3 F (36.8 C)    SpO2: 95% 100% 100%  Weight: 161 lb (73 kg)    Height: 5\' 2"  (1.575 m)     Body mass index is 29.45 kg/m. GEN: NAD EYE: Sclerae anicteric ENT: MMM CV: Non-tachycardic GI: Soft, NT/ND NEURO:  Alert & Oriented x 3  Lab Results: No results for input(s): "WBC", "HGB", "HCT", "PLT" in the last 72 hours. BMET No results for input(s): "NA", "K", "CL", "CO2", "GLUCOSE", "BUN", "CREATININE", "CALCIUM" in the last 72 hours. LFT No results for input(s): "PROT", "ALBUMIN", "AST", "ALT", "ALKPHOS", "BILITOT", "BILIDIR", "IBILI" in the last 72 hours. PT/INR No results for input(s): "LABPROT", "INR" in the last 72 hours.   Impression / Plan: This is a 52 y.o.female who presents for EGD/Colonoscopy for evaluation of abdominal pain and IDA and colon cancer screening.   The risks and benefits of endoscopic evaluation/treatment were discussed with the patient and/or family; these include but are not limited to the risk of perforation, infection, bleeding, missed lesions, lack of diagnosis, severe illness requiring hospitalization, as well as anesthesia and sedation related illnesses.  The patient's history has been reviewed, patient examined, no change in status, and deemed stable for procedure.  The patient and/or family is agreeable to proceed.    Corliss Parish, MD [facility] Advanced [facility]oscopy Office # [phone]

## 2023-10-05 ENCOUNTER — Telehealth: Payer: Self-pay

## 2023-10-05 NOTE — Telephone Encounter (Signed)
LMOM

## 2023-10-08 ENCOUNTER — Encounter: Payer: Self-pay | Admitting: Gastroenterology

## 2023-10-08 LAB — SURGICAL PATHOLOGY

## 2023-10-26 ENCOUNTER — Ambulatory Visit: Payer: BC Managed Care – PPO | Admitting: Physician Assistant

## 2023-10-26 ENCOUNTER — Encounter: Payer: Self-pay | Admitting: Physician Assistant

## 2023-10-26 VITALS — BP 128/74 | HR 79 | Temp 97.9°F | Resp 16 | Ht 62.0 in | Wt 165.0 lb

## 2023-10-26 DIAGNOSIS — F419 Anxiety disorder, unspecified: Secondary | ICD-10-CM | POA: Diagnosis not present

## 2023-10-26 DIAGNOSIS — Z23 Encounter for immunization: Secondary | ICD-10-CM

## 2023-10-26 DIAGNOSIS — E559 Vitamin D deficiency, unspecified: Secondary | ICD-10-CM | POA: Diagnosis not present

## 2023-10-26 DIAGNOSIS — E038 Other specified hypothyroidism: Secondary | ICD-10-CM | POA: Diagnosis not present

## 2023-10-26 DIAGNOSIS — M51362 Other intervertebral disc degeneration, lumbar region with discogenic back pain and lower extremity pain: Secondary | ICD-10-CM

## 2023-10-26 DIAGNOSIS — M0579 Rheumatoid arthritis with rheumatoid factor of multiple sites without organ or systems involvement: Secondary | ICD-10-CM

## 2023-10-26 DIAGNOSIS — I10 Essential (primary) hypertension: Secondary | ICD-10-CM

## 2023-10-26 DIAGNOSIS — K219 Gastro-esophageal reflux disease without esophagitis: Secondary | ICD-10-CM

## 2023-10-26 DIAGNOSIS — R899 Unspecified abnormal finding in specimens from other organs, systems and tissues: Secondary | ICD-10-CM

## 2023-10-26 DIAGNOSIS — E785 Hyperlipidemia, unspecified: Secondary | ICD-10-CM

## 2023-10-26 NOTE — Progress Notes (Signed)
Established Patient Office Visit  Subjective:  Patient ID: Bonnie Parker, female    DOB: 04-17-1971  Age: 53 y.o. MRN: [id number]  CC:  Chief Complaint  Patient presents with   Hyperlipidemia   Hypertension           HPI Bonnie Parker presents for hyperlipidemia  Mixed hyperlipidemia  Pt presents with hyperlipidemia. . Compliance with treatment has been good The patient is compliant with medications, maintains a low cholesterol diet , follows up as directed , and maintains an exercise regimen . The patient denies experiencing any hypercholesterolemia related symptoms. Pt currently on Zetia 10mg  qd and fenofibrate 160mg   Pt with history of sarcoidosis - she sees specialist Dr Jannet Mantis in [place] and also eye specialist Dr Sherryll Burger - currently on methotrexate weekly for management - states he advised her to come off fosamax until her next bone density test  Pt with history of anxiety and depression - she is currently taking zoloft 100mg  qd and wellburtin XL 300mg  qd - states symptoms are stable at this time.  She uses ativan as needed   Pt states she is no longer taking vit D supplements - she states rheumatologist told her to stop  Pt with history of hypothyroidism- is taking qd - voices no problems or concerns Is due to check TSH  Pt with history of GERD - she is currently on protonix 40mg  bid - had normal colonoscopy and did have endoscopy that showed nonbleeding ulcer - has been advised to stop any NSAIDs-   Pt presents for follow up of hypertension. The patient is tolerating the medication well without side effects. Compliance with treatment has been good; including taking medication as directed , maintains a healthy diet and regular exercise regimen , and following up as directed. Currently taking zestril 10mg  qd  Pt with chronic low back pain and diagnosis of DJD - has implanted pain stimulator She is stating she is now having pain again and would like referral  back to her orthopedist Dr Shon Baton at [facility]  Pt would like COVID vaccine Past Medical History:  Diagnosis Date   Age-related osteoporosis without current pathological fracture    Allergy    Anxiety    Arthritis    Asthma    Atrophy of thyroid (acquired)    Chronic kidney disease    Depression    Drug-induced myopathy    Gastro-esophageal reflux disease without esophagitis    Hypertension    Hypothyroidism    Lumbar spinal stenosis    Mixed hyperlipidemia    Polyarthralgia    Sarcoidosis of other sites     Past Surgical History:  Procedure Laterality Date   ABDOMINAL HYSTERECTOMY  1999   Total   APPENDECTOMY     BACK SURGERY  2005   Dr. Ronald Lobo EAR SURGERY     LUMBAR LAMINECTOMY/DECOMPRESSION MICRODISCECTOMY N/A 06/21/2017   Procedure: Lumbar decompression L3-4 ;  Surgeon: Venita Lick, MD;  Location: [facility] OR;  Service: Orthopedics;  Laterality: N/A;  3 hrs   SPINAL CORD STIMULATOR INSERTION N/A 03/13/2019   Procedure: LUMBAR SPINAL CORD STIMULATOR INSERTION;  Surgeon: Venita Lick, MD;  Location: [facility] OR;  Service: Orthopedics;  Laterality: N/A;   TONSILLECTOMY      Family History  Problem Relation Age of Onset   Hypertension Mother    Cancer - Lung Father    Colon cancer Neg Hx    Esophageal cancer Neg Hx    Rectal cancer  Neg Hx    Stomach cancer Neg Hx    Inflammatory bowel disease Neg Hx    Liver disease Neg Hx    Pancreatic cancer Neg Hx     Social History   Socioeconomic History   Marital status: Married    Spouse name: Not on file   Number of children: 4   Years of education: Not on file   Highest education level: Not on file  Occupational History   Not on file  Tobacco Use   Smoking status: Never   Smokeless tobacco: Never  Vaping Use   Vaping status: Never Used  Substance and Sexual Activity   Alcohol use: No   Drug use: No   Sexual activity: Not on file  Other Topics Concern   Not on file  Social History Narrative   Not on  file   Social Drivers of Health   Financial Resource Strain: Low Risk  (04/24/2023)   Overall Financial Resource Strain (CARDIA)    Difficulty of Paying Living Expenses: Not hard at all  Food Insecurity: No Food Insecurity (04/24/2023)   Hunger Vital Sign    Worried About Running Out of Food in the Last Year: Never true    Ran Out of Food in the Last Year: Never true  Transportation Needs: No Transportation Needs (04/24/2023)   PRAPARE - Administrator, Civil Service (Medical): No    Lack of Transportation (Non-Medical): No  Physical Activity: Inactive (04/24/2023)   Exercise Vital Sign    Days of Exercise per Week: 0 days    Minutes of Exercise per Session: 0 min  Stress: No Stress Concern Present (04/24/2023)   [employer] of Occupational Health - Occupational Stress Questionnaire    Feeling of Stress : Not at all  Social Connections: Moderately Isolated (04/24/2023)   Social Connection and Isolation Panel [NHANES]    Frequency of Communication with Friends and Family: More than three times a week    Frequency of Social Gatherings with Friends and Family: More than three times a week    Attends Religious Services: Never    Database administrator or Organizations: No    Attends Banker Meetings: Never    Marital Status: Married  Catering manager Violence: Not At Risk (04/24/2023)   Humiliation, Afraid, Rape, and Kick questionnaire    Fear of Current or Ex-Partner: No    Emotionally Abused: No    Physically Abused: No    Sexually Abused: No     Current Outpatient Medications:    acetaminophen (TYLENOL) 500 MG tablet, Take 1,000 mg by mouth every 6 (six) hours as needed for moderate pain or headache., Disp: , Rfl:    albuterol (PROAIR HFA) 108 (90 Base) MCG/ACT inhaler, Inhale 1-2 puffs into the lungs every 6 (six) hours as needed., Disp: 1 each, Rfl: 5   buPROPion (WELLBUTRIN XL) 300 MG 24 hr tablet, TAKE 1 TABLET BY MOUTH DAILY, Disp: 90 tablet, Rfl:  1   ezetimibe (ZETIA) 10 MG tablet, Take 1 tablet (10 mg total) by mouth daily., Disp: 30 tablet, Rfl: 1   fenofibrate 160 MG tablet, TAKE 1 TABLET BY MOUTH DAILY, Disp: 90 tablet, Rfl: 0   folic acid (FOLVITE) 1 MG tablet, Take 1 mg by mouth daily., Disp: , Rfl:    levothyroxine (SYNTHROID) 50 MCG tablet, TAKE 1 TABLET BY MOUTH DAILY  BEFORE BREAKFAST, Disp: 90 tablet, Rfl: 0   lisinopril (ZESTRIL) 10 MG tablet, TAKE 1 TABLET  BY MOUTH DAILY, Disp: 90 tablet, Rfl: 1   LORazepam (ATIVAN) 0.5 MG tablet, Take 1 tablet (0.5 mg total) by mouth daily as needed for anxiety., Disp: 30 tablet, Rfl: 1   methocarbamol (ROBAXIN) 750 MG tablet, TAKE 2 TABLETS BY MOUTH 3 TIMES  DAILY, Disp: 90 tablet, Rfl: 1   Methotrexate Sodium (METHOTREXATE, PF,) 50 MG/2ML injection, , Disp: , Rfl:    pantoprazole (PROTONIX) 40 MG tablet, Take 1 tablet (40 mg total) by mouth 2 (two) times daily., Disp: 60 tablet, Rfl: 4   sertraline (ZOLOFT) 100 MG tablet, TAKE 1 TABLET BY MOUTH DAILY, Disp: 90 tablet, Rfl: 1   Allergies  Allergen Reactions   Other Itching    blisters   Tape Other (See Comments) and Itching    blisters   Crestor [Rosuvastatin] Other (See Comments)    MYALGIAS, Muscle Aches   Lipitor [Atorvastatin] Other (See Comments)    MYALGIAS, Muscle Aches   Percocet [Oxycodone-Acetaminophen] Itching and Nausea Only   CONSTITUTIONAL: Negative for chills, fatigue, fever, unintentional weight gain and unintentional weight loss.  E/N/T: Negative for ear pain, nasal congestion and sore throat.  CARDIOVASCULAR: Negative for chest pain, dizziness, palpitations and pedal edema.  RESPIRATORY: Negative for recent cough and dyspnea.  GASTROINTESTINAL: Negative for abdominal pain, acid reflux symptoms, constipation, diarrhea, nausea and vomiting.  MSK: Negative for arthralgias and myalgias.  INTEGUMENTARY: Negative for rash.  NEUROLOGICAL: Negative for dizziness and headaches.  PSYCHIATRIC: Negative for sleep  disturbance and to question depression screen.  Negative for depression, negative for anhedonia.          Objective:  PHYSICAL EXAM:   VS: BP 128/74 (BP Location: Left Arm, Patient Position: Sitting, Cuff Size: Large)   Pulse 79   Temp 97.9 F (36.6 C) (Temporal)   Resp 16   Ht 5\' 2"  (1.575 m)   Wt 165 lb (74.8 kg)   LMP 10/09/1997 (Within Months) Comment: hysterectomy 1999  SpO2 99%   BMI 30.18 kg/m   GEN: Well nourished, well developed, in no acute distress   Cardiac: RRR; no murmurs, rubs, or gallops,no edema - Respiratory:  normal respiratory rate and pattern with no distress - normal breath sounds with no rales, rhonchi, wheezes or rubs  MS: no deformity or atrophy  Skin: warm and dry, no rash  Neuro:  Alert and Oriented x 3,  - CN II-Xii grossly intact Psych: euthymic mood, appropriate affect and demeanor     10/26/2023    9:59 AM 04/24/2023   10:35 AM 09/07/2022   10:53 AM 10/18/2020    9:07 AM 10/18/2020    9:04 AM  Depression screen PHQ 2/9  Decreased Interest 0 0 1 0 0  Down, Depressed, Hopeless 0 1 0 0 0  PHQ - 2 Score 0 1 1 0 0  Altered sleeping 0 1 0 0   Tired, decreased energy 0 1 3 0   Change in appetite 0 0 0 0   Feeling bad or failure about yourself  0 0 0 0   Trouble concentrating 0 0 0 0   Moving slowly or fidgety/restless 0 0 0 0   Suicidal thoughts 0 0 0 0   PHQ-9 Score 0 3 4 0   Difficult doing work/chores Not difficult at all Somewhat difficult Not difficult at all        No visits with results within 1 Day(s) from this visit.  Latest known visit with results is:  Procedure visit on 10/02/2023  Component Date Value Ref Range Status   SURGICAL PATHOLOGY 10/02/2023    Final-Edited                   Value:SURGICAL PATHOLOGY [facility] [address] [place], [place] [address] Telephone [phone] or [phone] Fax [phone]  REPORT OF SURGICAL PATHOLOGY   Accession #: [id number] Patient  Name: EMOJEAN, WINEBERG Visit # : [id number]  MRN: [id number] Physician: Corliss Parish DOB/Age [birth year] (Age: 27) Gender: F Collected Date: 10/02/2023 Received Date: 10/04/2023  FINAL DIAGNOSIS       1. Surgical [P], random gastric bx :       -  BENIGN GASTRIC MUCOSA WITH FEATURES CONSISTENT WITH PROTON PUMP INHIBITOR      EFFECT      -  NO H. PYLORI, INTESTINAL METAPLASIA OR MALIGNANCY IDENTIFIED       2. Surgical [P], duodenal bx :       -  PEPTIC DUODENITIS      -  NO DYSPLASIA OR MALIGNANCY IDENTIFIED       ELECTRONIC SIGNATURE : Charm Barges Md, Dawn, Sports administrator, International aid/development worker  MICROSCOPIC DESCRIPTION  CASE COMMENTS STAINS USED IN DIAGNOSIS: H&E H&E-2 H&E    CLINICAL HISTORY  SPECIMEN(S) OBTAINED 1. Sur                         gical [P], Random Gastric Bx 2. Surgical [P], Duodenal Bx  SPECIMEN COMMENTS: 1. Epigastric pain SPECIMEN CLINICAL INFORMATION: 1. R/O H.pylori 2. R/O celiac sprue    Gross Description 1. Received in formalin are tan, soft tissue fragments that are submitted in toto.Number: 5, Size: 0.3 cm smallest to 0.4 cm largest, (1B) ( TA ) 2. Received in formalin are tan, soft tissue fragments that are submitted in toto.Number: 3, Size: 0.3 cm smallest to 0.5 cm largest, (1B) ( TA )        Report signed out from the following location(s) [place] [address], [place] [address] CLIA #: [id number]  [facility] [address] [place], [place] [address] CLIA #: [id number]     There are no preventive care reminders to display for this patient.   There are no preventive care reminders to display for this patient.  Lab Results  Component Value Date   TSH 1.360 04/24/2023   Lab Results  Component Value Date   WBC 6.1 07/18/2023   HGB 10.5 (L) 07/18/2023   HCT 32.3 (L) 07/18/2023   MCV 92.0 07/18/2023   PLT 423.0 (H) 07/18/2023   Lab Results  Component Value Date    NA 139 07/18/2023   K 4.2 07/18/2023   CO2 23 07/18/2023   GLUCOSE 94 07/18/2023   BUN 21 07/18/2023   CREATININE 0.88 07/18/2023   BILITOT 0.3 07/18/2023   ALKPHOS 50 07/18/2023   AST 15 07/18/2023   ALT 15 07/18/2023   PROT 6.9 07/18/2023   ALBUMIN 4.6 07/18/2023   CALCIUM 9.7 07/18/2023   ANIONGAP 7 03/11/2019   EGFR 57 (L) 04/24/2023   GFR 74.95 07/18/2023   Lab Results  Component Value Date   CHOL 186 04/24/2023   Lab Results  Component Value Date   HDL 45 04/24/2023   Lab Results  Component Value Date   LDLCALC 116 (H) 04/24/2023   Lab Results  Component Value Date   TRIG 139 04/24/2023   Lab Results  Component Value Date  CHOLHDL 4.1 04/24/2023   No results found for: "HGBA1C"    Assessment & Plan:   Problem List Items Addressed This Visit       Cardiovascular and Mediastinum   Benign hypertension - Primary   Relevant Orders   CBC with Differential/Platelet   Comprehensive metabolic panel Continue current meds as directed     GERD - uncontrolled   Continue protonix 40mg  bid           Endocrine   Other specified hypothyroidism   Relevant Orders   TSH Continue med     Musculoskeletal and Integument   Osteoporosis   Relevant Orders   VITAMIN D 25 Hydroxy (Vit-D Deficiency, Fractures) Continue fosamax      Other   Dyslipidemia   Relevant Orders   Lipid panel Continue meds  Anxiety Continue current meds  Sarcoidosis Continue meds and follow up with specialist as directed  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   Follow-up: Return in about 6 months (around 04/24/2024) for chronic fasting follow-up.    SARA R Leticia Coletta, PA-C

## 2023-10-27 LAB — COMPREHENSIVE METABOLIC PANEL
ALT: 20 [IU]/L (ref 0–32)
AST: 18 [IU]/L (ref 0–40)
Albumin: 4.5 g/dL (ref 3.8–4.9)
Alkaline Phosphatase: 63 [IU]/L (ref 44–121)
BUN/Creatinine Ratio: 24 — ABNORMAL HIGH (ref 9–23)
BUN: 23 mg/dL (ref 6–24)
Bilirubin Total: 0.3 mg/dL (ref 0.0–1.2)
CO2: 21 mmol/L (ref 20–29)
Calcium: 9.2 mg/dL (ref 8.7–10.2)
Chloride: 103 mmol/L (ref 96–106)
Creatinine, Ser: 0.94 mg/dL (ref 0.57–1.00)
Globulin, Total: 2.4 g/dL (ref 1.5–4.5)
Glucose: 102 mg/dL — ABNORMAL HIGH (ref 70–99)
Potassium: 4.8 mmol/L (ref 3.5–5.2)
Sodium: 140 mmol/L (ref 134–144)
Total Protein: 6.9 g/dL (ref 6.0–8.5)
eGFR: 73 mL/min/{1.73_m2} (ref >59)

## 2023-10-27 LAB — LIPID PANEL
Chol/HDL Ratio: 3.7 {ratio} (ref 0.0–4.4)
Cholesterol, Total: 160 mg/dL (ref 100–199)
HDL: 43 mg/dL (ref >39)
LDL Chol Calc (NIH): 95 mg/dL (ref 0–99)
Triglycerides: 125 mg/dL (ref 0–149)
VLDL Cholesterol Cal: 22 mg/dL (ref 5–40)

## 2023-10-27 LAB — IRON,TIBC AND FERRITIN PANEL
Ferritin: 90 ng/mL (ref 15–150)
Iron Saturation: 13 % — ABNORMAL LOW (ref 15–55)
Iron: 62 ug/dL (ref 27–159)
Total Iron Binding Capacity: 472 ug/dL — ABNORMAL HIGH (ref 250–450)
UIBC: 410 ug/dL (ref 131–425)

## 2023-10-27 LAB — CBC WITH DIFFERENTIAL/PLATELET
Basophils Absolute: 0 10*3/uL (ref 0.0–0.2)
Basos: 1 %
EOS (ABSOLUTE): 0.3 10*3/uL (ref 0.0–0.4)
Eos: 6 %
Hematocrit: 34.4 % (ref 34.0–46.6)
Hemoglobin: 10.9 g/dL — ABNORMAL LOW (ref 11.1–15.9)
Immature Grans (Abs): 0 10*3/uL (ref 0.0–0.1)
Immature Granulocytes: 0 %
Lymphocytes Absolute: 1.4 10*3/uL (ref 0.7–3.1)
Lymphs: 31 %
MCH: 29.4 pg (ref 26.6–33.0)
MCHC: 31.7 g/dL (ref 31.5–35.7)
MCV: 93 fL (ref 79–97)
Monocytes Absolute: 0.4 10*3/uL (ref 0.1–0.9)
Monocytes: 9 %
Neutrophils Absolute: 2.4 10*3/uL (ref 1.4–7.0)
Neutrophils: 53 %
Platelets: 350 10*3/uL (ref 150–450)
RBC: 3.71 x10E6/uL — ABNORMAL LOW (ref 3.77–5.28)
RDW: 14.1 % (ref 11.7–15.4)
WBC: 4.5 10*3/uL (ref 3.4–10.8)

## 2023-10-27 LAB — VITAMIN D 25 HYDROXY (VIT D DEFICIENCY, FRACTURES): Vit D, 25-Hydroxy: 42.9 ng/mL (ref 30.0–100.0)

## 2023-10-27 LAB — TSH: TSH: 1.23 u[IU]/mL (ref 0.450–4.500)

## 2023-10-29 ENCOUNTER — Encounter: Payer: Self-pay | Admitting: Family Medicine

## 2023-11-01 ENCOUNTER — Other Ambulatory Visit: Payer: Self-pay | Admitting: Family Medicine

## 2023-11-01 ENCOUNTER — Other Ambulatory Visit: Payer: Self-pay | Admitting: Physician Assistant

## 2023-11-01 DIAGNOSIS — F419 Anxiety disorder, unspecified: Secondary | ICD-10-CM

## 2023-11-01 DIAGNOSIS — M25551 Pain in right hip: Secondary | ICD-10-CM

## 2023-11-12 ENCOUNTER — Encounter: Payer: Self-pay | Admitting: Physician Assistant

## 2023-11-25 ENCOUNTER — Other Ambulatory Visit: Payer: Self-pay | Admitting: Physician Assistant

## 2023-12-01 ENCOUNTER — Encounter: Payer: Self-pay | Admitting: Gastroenterology

## 2023-12-06 ENCOUNTER — Encounter: Payer: Self-pay | Admitting: Gastroenterology

## 2023-12-07 ENCOUNTER — Other Ambulatory Visit: Payer: Self-pay | Admitting: Physician Assistant

## 2023-12-07 ENCOUNTER — Telehealth: Payer: Self-pay | Admitting: Pharmacy Technician

## 2023-12-07 ENCOUNTER — Other Ambulatory Visit (HOSPITAL_COMMUNITY): Payer: Self-pay

## 2023-12-07 DIAGNOSIS — I10 Essential (primary) hypertension: Secondary | ICD-10-CM

## 2023-12-07 NOTE — Telephone Encounter (Signed)
 Pharmacy Patient Advocate Encounter   Received notification from Physician's Office that prior authorization for PANTOPRAZOLE 40MG  is required/requested.   Insurance verification completed.   The patient is insured through [facility] .   Per test claim: PA required; PA submitted to above mentioned insurance via Prompt PA Key/confirmation #/EOC [id number] Status is pending

## 2023-12-13 ENCOUNTER — Other Ambulatory Visit (HOSPITAL_COMMUNITY): Payer: Self-pay

## 2023-12-13 NOTE — Telephone Encounter (Signed)
 PA Team-  Can you please give any updates on pantoprazole approval for this patient?

## 2023-12-13 NOTE — Telephone Encounter (Signed)
 Pharmacy Patient Advocate Encounter  Received notification from [employer] that Prior Authorization for PANTOPRAZOLE 40MG  has been APPROVED from 02.28.25 to 2.28.26. Ran test claim, Copay is $12.16. This test claim was processed through [facility]- copay amounts may vary at other pharmacies due to pharmacy/plan contracts, or as the patient moves through the different stages of their insurance plan.   PA #/Case ID/Reference #:

## 2023-12-15 ENCOUNTER — Other Ambulatory Visit: Payer: Self-pay | Admitting: Physician Assistant

## 2023-12-15 DIAGNOSIS — F331 Major depressive disorder, recurrent, moderate: Secondary | ICD-10-CM

## 2024-01-22 ENCOUNTER — Ambulatory Visit (HOSPITAL_BASED_OUTPATIENT_CLINIC_OR_DEPARTMENT_OTHER)
Admission: RE | Admit: 2024-01-22 | Discharge: 2024-01-22 | Disposition: A | Discharge status: Home / Self Care | Source: Ambulatory Visit | Attending: Physician Assistant | Admitting: Physician Assistant

## 2024-01-22 ENCOUNTER — Ambulatory Visit: Admitting: Physician Assistant

## 2024-01-22 ENCOUNTER — Encounter: Payer: Self-pay | Admitting: Physician Assistant

## 2024-01-22 VITALS — BP 132/78 | HR 80 | Temp 97.8°F | Ht 62.0 in | Wt 162.0 lb

## 2024-01-22 DIAGNOSIS — J4540 Moderate persistent asthma, uncomplicated: Secondary | ICD-10-CM | POA: Diagnosis not present

## 2024-01-22 DIAGNOSIS — J029 Acute pharyngitis, unspecified: Secondary | ICD-10-CM

## 2024-01-22 DIAGNOSIS — D869 Sarcoidosis, unspecified: Secondary | ICD-10-CM | POA: Diagnosis not present

## 2024-01-22 DIAGNOSIS — R053 Chronic cough: Secondary | ICD-10-CM | POA: Diagnosis not present

## 2024-01-22 DIAGNOSIS — J45909 Unspecified asthma, uncomplicated: Secondary | ICD-10-CM | POA: Insufficient documentation

## 2024-01-22 MED ORDER — PREDNISONE 20 MG PO TABS: ORAL_TABLET | ORAL | 0 refills | Status: AC

## 2024-01-22 NOTE — Progress Notes (Signed)
 Acute Office Visit  Subjective:    Patient ID: Bonnie Parker, female    DOB: April 18, 1971, 52 y.o.   MRN: [id number]  Chief Complaint  Patient presents with   Cough     HPI: Patient is in today for chronic cough.  Discussed the use of AI scribe software for clinical note transcription with the patient, who gave verbal consent to proceed.  History of Present Illness   Bonnie Parker, a patient with a history of asthma and sarcoidosis, presents with a persistent cough that has lasted for approximately three weeks. The cough began with a scratchy throat and progressed to a loss of voice, which has persisted for two and a half weeks. The patient has tried various over-the-counter medications, including Delsym, Tessalon Perles, and Robitussin, without relief. The patient has had to use her asthma inhaler to manage the cough. The patient denies any vomiting associated with the cough but reports instances of gagging due to the severity of the cough. The patient also reports a history of bronchitis or RSV last year, which was managed with an inhaler and possibly an antibiotic. The patient has been on pantoprazole for several years, with a dose increase two months ago. The patient also reports taking Benadryl, Claritin, and Zyrtec, and has tried nasal washing.       Past Medical History:  Diagnosis Date   Age-related osteoporosis without current pathological fracture    Allergy    Anxiety    Arthritis    Asthma    Atrophy of thyroid (acquired)    Chronic kidney disease    Depression    Drug-induced myopathy    Gastro-esophageal reflux disease without esophagitis    Hypertension    Hypothyroidism    Lumbar spinal stenosis    Mixed hyperlipidemia    Polyarthralgia    Sarcoidosis of other sites     Past Surgical History:  Procedure Laterality Date   ABDOMINAL HYSTERECTOMY  1999   Total   APPENDECTOMY     BACK SURGERY  2005   Dr. Ronald Lobo EAR SURGERY     LUMBAR  LAMINECTOMY/DECOMPRESSION MICRODISCECTOMY N/A 06/21/2017   Procedure: Lumbar decompression L3-4 ;  Surgeon: Venita Lick, MD;  Location: [facility] OR;  Service: Orthopedics;  Laterality: N/A;  3 hrs   SPINAL CORD STIMULATOR INSERTION N/A 03/13/2019   Procedure: LUMBAR SPINAL CORD STIMULATOR INSERTION;  Surgeon: Venita Lick, MD;  Location: [facility] OR;  Service: Orthopedics;  Laterality: N/A;   TONSILLECTOMY      Family History  Problem Relation Age of Onset   Hypertension Mother    Cancer - Lung Father    Colon cancer Neg Hx    Esophageal cancer Neg Hx    Rectal cancer Neg Hx    Stomach cancer Neg Hx    Inflammatory bowel disease Neg Hx    Liver disease Neg Hx    Pancreatic cancer Neg Hx     Social History   Socioeconomic History   Marital status: Married    Spouse name: Not on file   Number of children: 4   Years of education: Not on file   Highest education level: Not on file  Occupational History   Not on file  Tobacco Use   Smoking status: Never   Smokeless tobacco: Never  Vaping Use   Vaping status: Never Used  Substance and Sexual Activity   Alcohol use: No   Drug use: No   Sexual activity: Not on file  Other  Topics Concern   Not on file  Social History Narrative   Not on file   Social Drivers of Health   Financial Resource Strain: Low Risk  (04/24/2023)   Overall Financial Resource Strain (CARDIA)    Difficulty of Paying Living Expenses: Not hard at all  Food Insecurity: No Food Insecurity (04/24/2023)   Hunger Vital Sign    Worried About Running Out of Food in the Last Year: Never true    Ran Out of Food in the Last Year: Never true  Transportation Needs: No Transportation Needs (04/24/2023)   PRAPARE - Administrator, Civil Service (Medical): No    Lack of Transportation (Non-Medical): No  Physical Activity: Inactive (04/24/2023)   Exercise Vital Sign    Days of Exercise per Week: 0 days    Minutes of Exercise per Session: 0 min  Stress: No Stress  Concern Present (04/24/2023)   [employer] of Occupational Health - Occupational Stress Questionnaire    Feeling of Stress : Not at all  Social Connections: Moderately Isolated (04/24/2023)   Social Connection and Isolation Panel [NHANES]    Frequency of Communication with Friends and Family: More than three times a week    Frequency of Social Gatherings with Friends and Family: More than three times a week    Attends Religious Services: Never    Database administrator or Organizations: No    Attends Banker Meetings: Never    Marital Status: Married  Catering manager Violence: Not At Risk (04/24/2023)   Humiliation, Afraid, Rape, and Kick questionnaire    Fear of Current or Ex-Partner: No    Emotionally Abused: No    Physically Abused: No    Sexually Abused: No    Outpatient Medications Prior to Visit  Medication Sig Dispense Refill   acetaminophen (TYLENOL) 500 MG tablet Take 1,000 mg by mouth every 6 (six) hours as needed for moderate pain or headache.     albuterol (PROAIR HFA) 108 (90 Base) MCG/ACT inhaler Inhale 1-2 puffs into the lungs every 6 (six) hours as needed. 1 each 5   buPROPion (WELLBUTRIN XL) 300 MG 24 hr tablet TAKE 1 TABLET BY MOUTH DAILY 90 tablet 1   ezetimibe (ZETIA) 10 MG tablet Take 1 tablet (10 mg total) by mouth daily. 30 tablet 1   fenofibrate 160 MG tablet TAKE 1 TABLET BY MOUTH DAILY 90 tablet 1   folic acid (FOLVITE) 1 MG tablet Take 1 mg by mouth daily.     levothyroxine (SYNTHROID) 50 MCG tablet TAKE 1 TABLET BY MOUTH DAILY  BEFORE BREAKFAST 90 tablet 1   lisinopril (ZESTRIL) 10 MG tablet TAKE 1 TABLET BY MOUTH DAILY 90 tablet 1   LORazepam (ATIVAN) 0.5 MG tablet TAKE 1 TABLET BY MOUTH ONCE DAILY AS NEEDED FOR ANXIETY 30 tablet 1   methocarbamol (ROBAXIN) 750 MG tablet TAKE 2 TABLETS BY MOUTH 3 TIMES  DAILY 90 tablet 1   Methotrexate Sodium (METHOTREXATE, PF,) 50 MG/2ML injection      pantoprazole (PROTONIX) 40 MG tablet Take 1 tablet  (40 mg total) by mouth 2 (two) times daily. 60 tablet 4   sertraline (ZOLOFT) 100 MG tablet TAKE 1 TABLET BY MOUTH DAILY 90 tablet 1   No facility-administered medications prior to visit.    Allergies  Allergen Reactions   Other Itching    blisters   Tape Other (See Comments) and Itching    blisters   Crestor [Rosuvastatin] Other (See Comments)  MYALGIAS, Muscle Aches   Lipitor [Atorvastatin] Other (See Comments)    MYALGIAS, Muscle Aches   Percocet [Oxycodone-Acetaminophen] Itching and Nausea Only    Review of Systems  Constitutional:  Negative for appetite change, fatigue and fever.  HENT:  Negative for congestion, ear pain, sinus pressure and sore throat.   Respiratory:  Positive for cough (Dry cough x3 weeks). Negative for chest tightness, shortness of breath and wheezing.   Cardiovascular:  Negative for chest pain and palpitations.  Gastrointestinal:  Negative for abdominal pain, constipation, diarrhea, nausea and vomiting.  Genitourinary:  Negative for dysuria and hematuria.  Musculoskeletal:  Negative for arthralgias, back pain, joint swelling and myalgias.  Skin:  Negative for rash.  Neurological:  Negative for dizziness, weakness and headaches.  Psychiatric/Behavioral:  Negative for dysphoric mood. The patient is not nervous/anxious.        Objective:        01/22/2024    2:08 PM 10/26/2023    9:58 AM 10/02/2023    8:44 AM  Vitals with BMI  Height 5\' 2"  5\' 2"    Weight 162 lbs 165 lbs   BMI 29.62 30.17   Systolic 132 128 621  Diastolic 78 74 66  Pulse 80 79 68    Orthostatic VS for the past 72 hrs (Last 3 readings):  Patient Position BP Location  01/22/24 1408 Sitting Left Arm     Physical Exam Vitals reviewed.  Constitutional:      Appearance: Normal appearance.  Cardiovascular:     Rate and Rhythm: Normal rate and regular rhythm.     Heart sounds: Normal heart sounds.  Pulmonary:     Effort: Pulmonary effort is normal.     Breath sounds:  Normal breath sounds.  Abdominal:     General: Bowel sounds are normal.     Palpations: Abdomen is soft.     Tenderness: There is no abdominal tenderness.  Neurological:     Mental Status: She is alert and oriented to person, place, and time.  Psychiatric:        Mood and Affect: Mood normal.        Behavior: Behavior normal.     Health Maintenance Due  Topic Date Due   MAMMOGRAM  11/09/2023    There are no preventive care reminders to display for this patient.   Lab Results  Component Value Date   TSH 1.230 10/26/2023   Lab Results  Component Value Date   WBC 4.5 10/26/2023   HGB 10.9 (L) 10/26/2023   HCT 34.4 10/26/2023   MCV 93 10/26/2023   PLT 350 10/26/2023   Lab Results  Component Value Date   NA 140 10/26/2023   K 4.8 10/26/2023   CO2 21 10/26/2023   GLUCOSE 102 (H) 10/26/2023   BUN 23 10/26/2023   CREATININE 0.94 10/26/2023   BILITOT 0.3 10/26/2023   ALKPHOS 63 10/26/2023   AST 18 10/26/2023   ALT 20 10/26/2023   PROT 6.9 10/26/2023   ALBUMIN 4.5 10/26/2023   CALCIUM 9.2 10/26/2023   ANIONGAP 7 03/11/2019   EGFR 73 10/26/2023   GFR 74.95 07/18/2023   Lab Results  Component Value Date   CHOL 160 10/26/2023   Lab Results  Component Value Date   HDL 43 10/26/2023   Lab Results  Component Value Date   LDLCALC 95 10/26/2023   Lab Results  Component Value Date   TRIG 125 10/26/2023   Lab Results  Component Value Date   CHOLHDL 3.7  10/26/2023   No results found for: "HGBA1C"     Assessment & Plan:  Chronic cough Assessment & Plan: Persistent cough for three weeks, unresponsive to Delsym, Tessalon Perles, and Robitussin. Differential includes bronchitis, asthma flare, or upper respiratory infection. No infection signs on auscultation. Chest x-ray warranted. Oral prednisone considered. Discussed prednisone side effects and interaction monitoring with methotrexate. - Order chest x-ray to evaluate for bronchitis or other infections. -  Prescribe oral prednisone to reduce inflammation and manage cough. - Consider antibiotics if chest x-ray indicates infection. - Advise continued use of albuterol inhaler as needed for severe coughing episodes. - Provide sample of Airsupra inhaler for asthma maintenance and evaluate its effectiveness. - Instruct to report any changes in cough, such as increased production or deep lung involvement.  Orders: -     DG Chest 2 View; Future -     predniSONE; Take 3 tablets (60 mg total) by mouth daily with breakfast for 3 days, THEN 2 tablets (40 mg total) daily with breakfast for 3 days, THEN 1 tablet (20 mg total) daily with breakfast for 3 days.  Dispense: 18 tablet; Refill: 0  Sarcoidosis Assessment & Plan: Sarcoidosis affecting spleen, retroperitoneal lobes, back, and part of the head, but not the lungs. Managed with methotrexate injections weekly. Previous long-term prednisone use for ocular involvement. No current lung involvement, but monitoring is necessary. - Monitor for any signs of lung involvement with sarcoidosis. - Ensure no interaction between prednisone and methotrexate.   Moderate persistent asthma, unspecified whether complicated Assessment & Plan: Asthma with previous use of Advair and Symbicort. Current use of albuterol inhaler as needed. Possible asthma flare contributing to chronic cough. Consideration of maintenance therapy with Airsupra inhaler for better asthma control. - Provide sample of Airsupra inhaler for asthma maintenance. - Instruct to use Airsupra inhaler two puffs in the morning. - Advise to rinse mouth after using inhaler to prevent thrush. - Evaluate effectiveness of Airsupra inhaler and consider prescription if beneficial.       Meds ordered this encounter  Medications   predniSONE (DELTASONE) 20 MG tablet    Sig: Take 3 tablets (60 mg total) by mouth daily with breakfast for 3 days, THEN 2 tablets (40 mg total) daily with breakfast for 3 days, THEN 1  tablet (20 mg total) daily with breakfast for 3 days.    Dispense:  18 tablet    Refill:  0    Orders Placed This Encounter  Procedures   DG Chest 2 View    Follow-up: Return if symptoms worsen or fail to improve.  An After Visit Summary was printed and given to the patient.  I,Bonnie Parker,acting as a Neurosurgeon for [employer], [employer].,have documented all relevant documentation on the behalf of Bonnie Bennett, PA,as directed by  Bonnie Bennett, PA while in the presence of Bonnie Parker, Bonnie.    Bonnie Parker, Bonnie Parker [phone]

## 2024-01-22 NOTE — Assessment & Plan Note (Signed)
 Sarcoidosis affecting spleen, retroperitoneal lobes, back, and part of the head, but not the lungs. Managed with methotrexate injections weekly. Previous long-term prednisone use for ocular involvement. No current lung involvement, but monitoring is necessary. - Monitor for any signs of lung involvement with sarcoidosis. - Ensure no interaction between prednisone and methotrexate.

## 2024-01-22 NOTE — Patient Instructions (Signed)
 VISIT SUMMARY:  During your visit, we discussed your persistent cough that has lasted for three weeks and reviewed your history of asthma and sarcoidosis. We explored potential causes for your cough and outlined a plan to manage your symptoms and monitor your condition.  YOUR PLAN:  -CHRONIC COUGH: A chronic cough is a cough that lasts for an extended period, in this case, three weeks. We will order a chest x-ray to check for bronchitis or other infections. You will start taking oral prednisone to reduce inflammation and manage your cough. Continue using your albuterol inhaler as needed for severe coughing episodes. We provided a sample of the Airsupra inhaler for asthma maintenance and will evaluate its effectiveness. Please report any changes in your cough, such as increased production or deep lung involvement.  -ASTHMA: Asthma is a condition where your airways narrow and swell, making it difficult to breathe. We provided a sample of the Airsupra inhaler for better asthma control. Use two puffs of the Airsupra inhaler in the morning and rinse your mouth afterward to prevent thrush. We will evaluate its effectiveness and consider a prescription if it is beneficial.  -SARCOIDOSIS: Sarcoidosis is a disease that causes inflammation in various parts of the body. Your sarcoidosis affects your spleen, retroperitoneal lobes, back, and part of your head, but not your lungs. It is managed with weekly methotrexate injections. We will monitor for any signs of lung involvement and ensure there are no interactions between prednisone and methotrexate.  INSTRUCTIONS:  Please get a chest x-ray at [facility] in [place]. Follow up with the results of the chest x-ray to determine if antibiotics are needed. Schedule a follow-up appointment with your rheumatologist on April 25. Report any significant changes in your symptoms or if your cough worsens.

## 2024-01-22 NOTE — Assessment & Plan Note (Addendum)
 Persistent cough for three weeks, unresponsive to Delsym, Tessalon Perles, and Robitussin. Differential includes bronchitis, asthma flare, or upper respiratory infection. No infection signs on auscultation. Chest x-ray warranted. Oral prednisone considered. Discussed prednisone side effects and interaction monitoring with methotrexate. - Order chest x-ray to evaluate for bronchitis or other infections. - Prescribe oral prednisone to reduce inflammation and manage cough. - Consider antibiotics if chest x-ray indicates infection. - Advise continued use of albuterol inhaler as needed for severe coughing episodes. - Provide sample of Airsupra inhaler for asthma maintenance and evaluate its effectiveness. - Instruct to report any changes in cough, such as increased production or deep lung involvement.

## 2024-01-22 NOTE — Assessment & Plan Note (Signed)
 Asthma with previous use of Advair and Symbicort. Current use of albuterol inhaler as needed. Possible asthma flare contributing to chronic cough. Consideration of maintenance therapy with Airsupra inhaler for better asthma control. - Provide sample of Airsupra inhaler for asthma maintenance. - Instruct to use Airsupra inhaler two puffs in the morning. - Advise to rinse mouth after using inhaler to prevent thrush. - Evaluate effectiveness of Airsupra inhaler and consider prescription if beneficial.

## 2024-01-28 ENCOUNTER — Encounter: Payer: Self-pay | Admitting: Physician Assistant

## 2024-01-29 ENCOUNTER — Other Ambulatory Visit: Payer: Self-pay | Admitting: Physician Assistant

## 2024-01-29 ENCOUNTER — Encounter: Payer: Self-pay | Admitting: Physician Assistant

## 2024-01-29 ENCOUNTER — Ambulatory Visit: Admitting: Physician Assistant

## 2024-01-29 DIAGNOSIS — R053 Chronic cough: Secondary | ICD-10-CM

## 2024-01-30 ENCOUNTER — Other Ambulatory Visit: Payer: Self-pay | Admitting: Physician Assistant

## 2024-01-30 DIAGNOSIS — R053 Chronic cough: Secondary | ICD-10-CM

## 2024-01-30 MED ORDER — AZITHROMYCIN 250 MG PO TABS: ORAL_TABLET | ORAL | 0 refills | Status: AC

## 2024-02-05 ENCOUNTER — Other Ambulatory Visit: Payer: Self-pay | Admitting: Physician Assistant

## 2024-02-05 DIAGNOSIS — M5451 Vertebrogenic low back pain: Secondary | ICD-10-CM

## 2024-02-08 NOTE — Discharge Instructions (Signed)

## 2024-02-11 ENCOUNTER — Ambulatory Visit
Admission: RE | Admit: 2024-02-11 | Discharge: 2024-02-11 | Disposition: A | Discharge status: Home / Self Care | Source: Ambulatory Visit | Attending: Physician Assistant | Admitting: Physician Assistant

## 2024-02-11 ENCOUNTER — Other Ambulatory Visit: Payer: Self-pay | Admitting: Physician Assistant

## 2024-02-11 DIAGNOSIS — M5451 Vertebrogenic low back pain: Secondary | ICD-10-CM

## 2024-02-11 MED ORDER — MEPERIDINE HCL 50 MG/ML IJ SOLN: 50.0000 mg | Freq: Once | INTRAMUSCULAR | Status: DC | PRN

## 2024-02-11 MED ORDER — IOPAMIDOL (ISOVUE-M 300) INJECTION 61%
10.0000 mL | Freq: Once | INTRAMUSCULAR | Status: AC | PRN
  Administered 2024-02-11: 10 mL via INTRATHECAL

## 2024-02-11 MED ORDER — ONDANSETRON HCL 4 MG/2ML IJ SOLN: 4.0000 mg | Freq: Once | INTRAMUSCULAR | Status: DC | PRN

## 2024-02-11 MED ORDER — DIAZEPAM 5 MG PO TABS
10.0000 mg | ORAL_TABLET | Freq: Once | ORAL | Status: AC
  Administered 2024-02-11: 10 mg via ORAL

## 2024-02-12 ENCOUNTER — Encounter: Payer: Self-pay | Admitting: Physician Assistant

## 2024-04-24 ENCOUNTER — Other Ambulatory Visit: Payer: Self-pay | Admitting: Gastroenterology

## 2024-04-25 ENCOUNTER — Encounter: Payer: Self-pay | Admitting: Physician Assistant

## 2024-04-25 ENCOUNTER — Ambulatory Visit: Payer: BC Managed Care – PPO | Admitting: Physician Assistant

## 2024-04-25 VITALS — BP 142/88 | HR 83 | Temp 97.5°F | Ht 62.0 in | Wt 168.0 lb

## 2024-04-25 DIAGNOSIS — D508 Other iron deficiency anemias: Secondary | ICD-10-CM

## 2024-04-25 DIAGNOSIS — K219 Gastro-esophageal reflux disease without esophagitis: Secondary | ICD-10-CM

## 2024-04-25 DIAGNOSIS — D869 Sarcoidosis, unspecified: Secondary | ICD-10-CM | POA: Diagnosis not present

## 2024-04-25 DIAGNOSIS — E785 Hyperlipidemia, unspecified: Secondary | ICD-10-CM

## 2024-04-25 DIAGNOSIS — I10 Essential (primary) hypertension: Secondary | ICD-10-CM | POA: Diagnosis not present

## 2024-04-25 DIAGNOSIS — J4540 Moderate persistent asthma, uncomplicated: Secondary | ICD-10-CM

## 2024-04-25 DIAGNOSIS — M0579 Rheumatoid arthritis with rheumatoid factor of multiple sites without organ or systems involvement: Secondary | ICD-10-CM

## 2024-04-25 DIAGNOSIS — F419 Anxiety disorder, unspecified: Secondary | ICD-10-CM

## 2024-04-25 DIAGNOSIS — Z1231 Encounter for screening mammogram for malignant neoplasm of breast: Secondary | ICD-10-CM

## 2024-04-25 DIAGNOSIS — E038 Other specified hypothyroidism: Secondary | ICD-10-CM

## 2024-04-25 DIAGNOSIS — E559 Vitamin D deficiency, unspecified: Secondary | ICD-10-CM | POA: Diagnosis not present

## 2024-04-25 NOTE — Progress Notes (Signed)
 Established Patient Office Visit  Subjective:  Patient ID: Bonnie Parker, female    DOB: 01-08-1971  Age: 53 y.o. MRN: [id number]  CC:  Chief Complaint  Patient presents with   Hyperlipidemia   Hypertension           HPI Bonnie Parker presents for hyperlipidemia  Mixed hyperlipidemia  Pt presents with hyperlipidemia. . Compliance with treatment has been good The patient is compliant with medications, maintains a low cholesterol diet , follows up as directed , and maintains an exercise regimen . The patient denies experiencing any hypercholesterolemia related symptoms. Pt currently on Zetia  10mg  qd and fenofibrate  160mg   Pt with history of sarcoidosis - she sees specialist Dr Briant in [place] and also eye specialist Dr Maree - currently on methotrexate  weekly for management -   Pt with history of anxiety and depression - she is currently taking zoloft  100mg  qd and wellbutrin  XL 300mg  qd - states symptoms are stable at this time.  She uses ativan  as needed   Pt states she is no longer taking vit D supplements - she states rheumatologist told her to stop  Pt with history of hypothyroidism- is taking 50mcg qd - voices no problems or concerns Is due to check TSH  Pt with history of GERD - she is currently on protonix  40mg  bid - had normal colonoscopy and did have endoscopy that showed nonbleeding ulcer - has been advised to stop any NSAIDs-   Pt presents for follow up of hypertension. The patient is tolerating the medication well without side effects. Compliance with treatment has been good; including taking medication as directed , maintains a healthy diet and regular exercise regimen , and following up as directed. Currently taking zestril  10mg  qd  Pt with chronic low back pain and diagnosis of DJD - has implanted pain stimulator She is using robaxin  as needed - she will be possibly having surgery for spinal fusion of L2-L4 before end of year   Past Medical History:   Diagnosis Date   Age-related osteoporosis without current pathological fracture    Allergy    Anxiety    Arthritis    Asthma    Atrophy of thyroid (acquired)    Chronic kidney disease    Depression    Drug-induced myopathy    Gastro-esophageal reflux disease without esophagitis    Hypertension    Hypothyroidism    Lumbar spinal stenosis    Mixed hyperlipidemia    Polyarthralgia    Sarcoidosis of other sites     Past Surgical History:  Procedure Laterality Date   ABDOMINAL HYSTERECTOMY  1999   Total   APPENDECTOMY     BACK SURGERY  2005   Dr. Joshua GRIST EAR SURGERY     LUMBAR LAMINECTOMY/DECOMPRESSION MICRODISCECTOMY N/A 06/21/2017   Procedure: Lumbar decompression L3-4 ;  Surgeon: Burnetta Aures, MD;  Location: [facility] OR;  Service: Orthopedics;  Laterality: N/A;  3 hrs   SPINAL CORD STIMULATOR INSERTION N/A 03/13/2019   Procedure: LUMBAR SPINAL CORD STIMULATOR INSERTION;  Surgeon: Burnetta Aures, MD;  Location: [facility] OR;  Service: Orthopedics;  Laterality: N/A;   TONSILLECTOMY      Family History  Problem Relation Age of Onset   Hypertension Mother    Cancer - Lung Father    Colon cancer Neg Hx    Esophageal cancer Neg Hx    Rectal cancer Neg Hx    Stomach cancer Neg Hx    Inflammatory bowel disease Neg Hx  Liver disease Neg Hx    Pancreatic cancer Neg Hx     Social History   Socioeconomic History   Marital status: Married    Spouse name: Not on file   Number of children: 4   Years of education: Not on file   Highest education level: Not on file  Occupational History   Not on file  Tobacco Use   Smoking status: Never   Smokeless tobacco: Never  Vaping Use   Vaping status: Never Used  Substance and Sexual Activity   Alcohol use: No   Drug use: No   Sexual activity: Not on file  Other Topics Concern   Not on file  Social History Narrative   Not on file   Social Drivers of Health   Financial Resource Strain: Low Risk  (04/24/2023)   Overall  Financial Resource Strain (CARDIA)    Difficulty of Paying Living Expenses: Not hard at all  Food Insecurity: No Food Insecurity (04/24/2023)   Hunger Vital Sign    Worried About Running Out of Food in the Last Year: Never true    Ran Out of Food in the Last Year: Never true  Transportation Needs: No Transportation Needs (04/24/2023)   PRAPARE - Administrator, Civil Service (Medical): No    Lack of Transportation (Non-Medical): No  Physical Activity: Inactive (04/24/2023)   Exercise Vital Sign    Days of Exercise per Week: 0 days    Minutes of Exercise per Session: 0 min  Stress: No Stress Concern Present (04/24/2023)   [employer] of Occupational Health - Occupational Stress Questionnaire    Feeling of Stress : Not at all  Social Connections: Moderately Isolated (04/24/2023)   Social Connection and Isolation Panel    Frequency of Communication with Friends and Family: More than three times a week    Frequency of Social Gatherings with Friends and Family: More than three times a week    Attends Religious Services: Never    Database administrator or Organizations: No    Attends Banker Meetings: Never    Marital Status: Married  Catering manager Violence: Not At Risk (04/24/2023)   Humiliation, Afraid, Rape, and Kick questionnaire    Fear of Current or Ex-Partner: No    Emotionally Abused: No    Physically Abused: No    Sexually Abused: No     Current Outpatient Medications:    acetaminophen  (TYLENOL ) 500 MG tablet, Take 1,000 mg by mouth every 6 (six) hours as needed for moderate pain or headache., Disp: , Rfl:    albuterol  (PROAIR  HFA) 108 (90 Base) MCG/ACT inhaler, Inhale 1-2 puffs into the lungs every 6 (six) hours as needed., Disp: 1 each, Rfl: 5   buPROPion  (WELLBUTRIN  XL) 300 MG 24 hr tablet, TAKE 1 TABLET BY MOUTH DAILY, Disp: 90 tablet, Rfl: 1   ezetimibe  (ZETIA ) 10 MG tablet, Take 1 tablet (10 mg total) by mouth daily., Disp: 30 tablet, Rfl: 1    fenofibrate  160 MG tablet, TAKE 1 TABLET BY MOUTH DAILY, Disp: 90 tablet, Rfl: 1   folic acid  (FOLVITE ) 1 MG tablet, Take 1 mg by mouth daily., Disp: , Rfl:    levothyroxine  (SYNTHROID ) 50 MCG tablet, TAKE 1 TABLET BY MOUTH DAILY  BEFORE BREAKFAST, Disp: 90 tablet, Rfl: 1   lisinopril  (ZESTRIL ) 10 MG tablet, TAKE 1 TABLET BY MOUTH DAILY, Disp: 90 tablet, Rfl: 1   LORazepam  (ATIVAN ) 0.5 MG tablet, TAKE 1 TABLET BY MOUTH ONCE  DAILY AS NEEDED FOR ANXIETY, Disp: 30 tablet, Rfl: 1   methocarbamol  (ROBAXIN ) 750 MG tablet, TAKE 2 TABLETS BY MOUTH 3 TIMES  DAILY, Disp: 90 tablet, Rfl: 1   Methotrexate  Sodium (METHOTREXATE , PF,) 50 MG/2ML injection, , Disp: , Rfl:    pantoprazole  (PROTONIX ) 40 MG tablet, TAKE 1 TABLET BY MOUTH TWICE  DAILY, Disp: 90 tablet, Rfl: 1   sertraline  (ZOLOFT ) 100 MG tablet, TAKE 1 TABLET BY MOUTH DAILY, Disp: 90 tablet, Rfl: 1   Allergies  Allergen Reactions   Other Itching    blisters   Tape Other (See Comments) and Itching    blisters   Atorvastatin  Other (See Comments)    MYALGIAS, Muscle Aches  atorvastatin    Rosuvastatin Other (See Comments)    MYALGIAS, Muscle Aches  rosuvastatin   Oxycodone  Itching   Percocet [Oxycodone -Acetaminophen ] Itching and Nausea Only   CONSTITUTIONAL: Negative for chills, fatigue, fever, unintentional weight gain and unintentional weight loss.  E/N/T: Negative for ear pain, nasal congestion and sore throat.  CARDIOVASCULAR: Negative for chest pain, dizziness, palpitations and pedal edema.  RESPIRATORY: Negative for recent cough and dyspnea.  GASTROINTESTINAL: Negative for abdominal pain, acid reflux symptoms, constipation, diarrhea, nausea and vomiting.  MSK: see HPI INTEGUMENTARY: Negative for rash.  NEUROLOGICAL: Negative for dizziness and headaches.  PSYCHIATRIC: Negative for sleep disturbance and to question depression screen.  Negative for depression, negative for anhedonia.       Objective:  PHYSICAL EXAM:   VS: BP  (!) 142/88 (BP Location: Left Arm, Patient Position: Sitting)   Pulse 83   Temp (!) 97.5 F (36.4 C) (Temporal)   Ht 5' 2 (1.575 m)   Wt 168 lb (76.2 kg)   LMP 10/09/1997 (Within Months) Comment: hysterectomy 1999  SpO2 99%   BMI 30.73 kg/m   GEN: Well nourished, well developed, in no acute distress   Cardiac: RRR; no murmurs, rubs, or gallops,no edema -  Respiratory:  normal respiratory rate and pattern with no distress - normal breath sounds with no rales, rhonchi, wheezes or rubs  MS: no deformity or atrophy  Skin: warm and dry, no rash  Neuro:  Alert and Oriented x 3, - CN II-Xii grossly intact Psych: euthymic mood, appropriate affect and demeanor      04/25/2024    8:32 AM 10/26/2023    9:59 AM 04/24/2023   10:35 AM 09/07/2022   10:53 AM 10/18/2020    9:07 AM  Depression screen PHQ 2/9  Decreased Interest 0 0 0 1 0  Down, Depressed, Hopeless 0 0 1 0 0  PHQ - 2 Score 0 0 1 1 0  Altered sleeping 1 0 1 0 0  Tired, decreased energy 1 0 1 3 0  Change in appetite 0 0 0 0 0  Feeling bad or failure about yourself  0 0 0 0 0  Trouble concentrating 0 0 0 0 0  Moving slowly or fidgety/restless 0 0 0 0 0  Suicidal thoughts 0 0 0 0 0  PHQ-9 Score 2 0 3 4 0  Difficult doing work/chores Not difficult at all Not difficult at all Somewhat difficult Not difficult at all       No visits with results within 1 Day(s) from this visit.  Latest known visit with results is:  Office Visit on 10/26/2023  Component Date Value Ref Range Status   WBC 10/26/2023 4.5  3.4 - 10.8 x10E3/uL Final   RBC 10/26/2023 3.71 (L)  3.77 - 5.28 x10E6/uL Final  Hemoglobin 10/26/2023 10.9 (L)  11.1 - 15.9 g/dL Final   Hematocrit 98/82/7974 34.4  34.0 - 46.6 % Final   MCV 10/26/2023 93  79 - 97 fL Final   MCH 10/26/2023 29.4  26.6 - 33.0 pg Final   MCHC 10/26/2023 31.7  31.5 - 35.7 g/dL Final   RDW 98/82/7974 14.1  11.7 - 15.4 % Final   Platelets 10/26/2023 350  150 - 450 x10E3/uL Final   Neutrophils  10/26/2023 53  Not Estab. % Final   Lymphs 10/26/2023 31  Not Estab. % Final   Monocytes 10/26/2023 9  Not Estab. % Final   Eos 10/26/2023 6  Not Estab. % Final   Basos 10/26/2023 1  Not Estab. % Final   Neutrophils Absolute 10/26/2023 2.4  1.4 - 7.0 x10E3/uL Final   Lymphocytes Absolute 10/26/2023 1.4  0.7 - 3.1 x10E3/uL Final   Monocytes Absolute 10/26/2023 0.4  0.1 - 0.9 x10E3/uL Final   EOS (ABSOLUTE) 10/26/2023 0.3  0.0 - 0.4 x10E3/uL Final   Basophils Absolute 10/26/2023 0.0  0.0 - 0.2 x10E3/uL Final   Immature Granulocytes 10/26/2023 0  Not Estab. % Final   Immature Grans (Abs) 10/26/2023 0.0  0.0 - 0.1 x10E3/uL Final   Glucose 10/26/2023 102 (H)  70 - 99 mg/dL Final   BUN 98/82/7974 23  6 - 24 mg/dL Final   Creatinine, Ser 10/26/2023 0.94  0.57 - 1.00 mg/dL Final   eGFR 98/82/7974 73  >59 mL/min/1.73 Final   BUN/Creatinine Ratio 10/26/2023 24 (H)  9 - 23 Final   Sodium 10/26/2023 140  134 - 144 mmol/L Final   Potassium 10/26/2023 4.8  3.5 - 5.2 mmol/L Final   Chloride 10/26/2023 103  96 - 106 mmol/L Final   CO2 10/26/2023 21  20 - 29 mmol/L Final   Calcium  10/26/2023 9.2  8.7 - 10.2 mg/dL Final   Total Protein 98/82/7974 6.9  6.0 - 8.5 g/dL Final   Albumin 98/82/7974 4.5  3.8 - 4.9 g/dL Final   Globulin, Total 10/26/2023 2.4  1.5 - 4.5 g/dL Final   Bilirubin Total 10/26/2023 0.3  0.0 - 1.2 mg/dL Final   Alkaline Phosphatase 10/26/2023 63  44 - 121 IU/L Final   AST 10/26/2023 18  0 - 40 IU/L Final   ALT 10/26/2023 20  0 - 32 IU/L Final   TSH 10/26/2023 1.230  0.450 - 4.500 uIU/mL Final   Cholesterol, Total 10/26/2023 160  100 - 199 mg/dL Final   Triglycerides 98/82/7974 125  0 - 149 mg/dL Final   HDL 98/82/7974 43  >39 mg/dL Final   VLDL Cholesterol Cal 10/26/2023 22  5 - 40 mg/dL Final   LDL Chol Calc (NIH) 10/26/2023 95  0 - 99 mg/dL Final   Chol/HDL Ratio 10/26/2023 3.7  0.0 - 4.4 ratio Final   Comment:                                   T. Chol/HDL Ratio                                              Men  Women  1/2 Avg.Risk  3.4    3.3                                   Avg.Risk  5.0    4.4                                2X Avg.Risk  9.6    7.1                                3X Avg.Risk 23.4   11.0    Total Iron Binding Capacity 10/26/2023 472 (H)  250 - 450 ug/dL Final   UIBC 98/82/7974 410  131 - 425 ug/dL Final   Iron 98/82/7974 62  27 - 159 ug/dL Final   Iron Saturation 10/26/2023 13 (L)  15 - 55 % Final   Ferritin 10/26/2023 90  15 - 150 ng/mL Final   Vit D, 25-Hydroxy 10/26/2023 42.9  30.0 - 100.0 ng/mL Final   Comment: Vitamin D  deficiency has been defined by the Institute of Medicine and an Endocrine Society practice guideline as a level of serum 25-OH vitamin D  less than 20 ng/mL (1,2). The Endocrine Society went on to further define vitamin D  insufficiency as a level between 21 and 29 ng/mL (2). 1. IOM (Institute of Medicine). 2010. Dietary reference    intakes for calcium  and D. [place]: The    [employer]. 2. Holick MF, Binkley [place], Bischoff-Ferrari HA, et al.    Evaluation, treatment, and prevention of vitamin D     deficiency: an Endocrine Society clinical practice    guideline. JCEM. 2011 Jul; 96(7):1911-30.     Health Maintenance Due  Topic Date Due   MAMMOGRAM  11/09/2023     There are no preventive care reminders to display for this patient.   Lab Results  Component Value Date   TSH 1.230 10/26/2023   Lab Results  Component Value Date   WBC 4.5 10/26/2023   HGB 10.9 (L) 10/26/2023   HCT 34.4 10/26/2023   MCV 93 10/26/2023   PLT 350 10/26/2023   Lab Results  Component Value Date   NA 140 10/26/2023   K 4.8 10/26/2023   CO2 21 10/26/2023   GLUCOSE 102 (H) 10/26/2023   BUN 23 10/26/2023   CREATININE 0.94 10/26/2023   BILITOT 0.3 10/26/2023   ALKPHOS 63 10/26/2023   AST 18 10/26/2023   ALT 20 10/26/2023   PROT 6.9 10/26/2023   ALBUMIN 4.5 10/26/2023   CALCIUM  9.2  10/26/2023   ANIONGAP 7 03/11/2019   EGFR 73 10/26/2023   GFR 74.95 07/18/2023   Lab Results  Component Value Date   CHOL 160 10/26/2023   Lab Results  Component Value Date   HDL 43 10/26/2023   Lab Results  Component Value Date   LDLCALC 95 10/26/2023   Lab Results  Component Value Date   TRIG 125 10/26/2023   Lab Results  Component Value Date   CHOLHDL 3.7 10/26/2023   No results found for: HGBA1C    Assessment & Plan:   Problem List Items Addressed This Visit       Cardiovascular and Mediastinum   Benign hypertension - Primary   Relevant Orders   CBC with Differential/Platelet   Comprehensive metabolic panel Continue current meds  as directed Recheck bp in one month     GERD - uncontrolled   Continue protonix  40mg  bid           Endocrine   Other specified hypothyroidism   Relevant Orders   TSH Continue med     Musculoskeletal and Integument   Osteoporosis   Relevant Orders   VITAMIN D  25 Hydroxy (Vit-D Deficiency, Fractures)      Other   Dyslipidemia   Relevant Orders   Lipid panel Continue meds  Anxiety Continue current meds  Sarcoidosis Continue meds and follow up with specialist as directed  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   Follow-up: Return in about 6 months (around 10/26/2024) for chronic fasting follow-up - bp check one month.    SARA R Shy Guallpa, PA-C

## 2024-04-26 LAB — COMPREHENSIVE METABOLIC PANEL WITH GFR
ALT: 18 IU/L (ref 0–32)
AST: 18 IU/L (ref 0–40)
Albumin: 4.6 g/dL (ref 3.8–4.9)
Alkaline Phosphatase: 65 IU/L (ref 44–121)
BUN/Creatinine Ratio: 21 (ref 9–23)
BUN: 22 mg/dL (ref 6–24)
Bilirubin Total: 0.3 mg/dL (ref 0.0–1.2)
CO2: 21 mmol/L (ref 20–29)
Calcium: 9.4 mg/dL (ref 8.7–10.2)
Chloride: 104 mmol/L (ref 96–106)
Creatinine, Ser: 1.05 mg/dL — ABNORMAL HIGH (ref 0.57–1.00)
Globulin, Total: 2.3 g/dL (ref 1.5–4.5)
Glucose: 98 mg/dL (ref 70–99)
Potassium: 4.5 mmol/L (ref 3.5–5.2)
Sodium: 140 mmol/L (ref 134–144)
Total Protein: 6.9 g/dL (ref 6.0–8.5)
eGFR: 63 mL/min/1.73 (ref >59)

## 2024-04-26 LAB — CBC WITH DIFFERENTIAL/PLATELET
Basophils Absolute: 0.1 x10E3/uL (ref 0.0–0.2)
Basos: 1 %
EOS (ABSOLUTE): 0.3 x10E3/uL (ref 0.0–0.4)
Eos: 5 %
Hematocrit: 39.7 % (ref 34.0–46.6)
Hemoglobin: 12.4 g/dL (ref 11.1–15.9)
Immature Grans (Abs): 0 x10E3/uL (ref 0.0–0.1)
Immature Granulocytes: 0 %
Lymphocytes Absolute: 1.7 x10E3/uL (ref 0.7–3.1)
Lymphs: 26 %
MCH: 29.7 pg (ref 26.6–33.0)
MCHC: 31.2 g/dL — ABNORMAL LOW (ref 31.5–35.7)
MCV: 95 fL (ref 79–97)
Monocytes Absolute: 0.5 x10E3/uL (ref 0.1–0.9)
Monocytes: 8 %
Neutrophils Absolute: 3.8 x10E3/uL (ref 1.4–7.0)
Neutrophils: 60 %
Platelets: 391 x10E3/uL (ref 150–450)
RBC: 4.18 x10E6/uL (ref 3.77–5.28)
RDW: 14.8 % (ref 11.7–15.4)
WBC: 6.3 x10E3/uL (ref 3.4–10.8)

## 2024-04-26 LAB — LIPID PANEL
Chol/HDL Ratio: 3.3 ratio (ref 0.0–4.4)
Cholesterol, Total: 148 mg/dL (ref 100–199)
HDL: 45 mg/dL (ref >39)
LDL Chol Calc (NIH): 82 mg/dL (ref 0–99)
Triglycerides: 117 mg/dL (ref 0–149)
VLDL Cholesterol Cal: 21 mg/dL (ref 5–40)

## 2024-04-26 LAB — IRON,TIBC AND FERRITIN PANEL
Ferritin: 107 ng/mL (ref 15–150)
Iron Saturation: 20 % (ref 15–55)
Iron: 89 ug/dL (ref 27–159)
Total Iron Binding Capacity: 448 ug/dL (ref 250–450)
UIBC: 359 ug/dL (ref 131–425)

## 2024-04-26 LAB — TSH: TSH: 1.53 u[IU]/mL (ref 0.450–4.500)

## 2024-04-26 LAB — VITAMIN D 25 HYDROXY (VIT D DEFICIENCY, FRACTURES): Vit D, 25-Hydroxy: 29.3 ng/mL — ABNORMAL LOW (ref 30.0–100.0)

## 2024-04-28 ENCOUNTER — Ambulatory Visit: Payer: Self-pay | Admitting: Physician Assistant

## 2024-04-30 ENCOUNTER — Other Ambulatory Visit: Payer: Self-pay | Admitting: Physician Assistant

## 2024-05-01 DIAGNOSIS — R29898 Other symptoms and signs involving the musculoskeletal system: Secondary | ICD-10-CM | POA: Insufficient documentation

## 2024-05-01 DIAGNOSIS — M5416 Radiculopathy, lumbar region: Secondary | ICD-10-CM | POA: Insufficient documentation

## 2024-05-03 ENCOUNTER — Other Ambulatory Visit: Payer: Self-pay | Admitting: Physician Assistant

## 2024-05-20 ENCOUNTER — Other Ambulatory Visit: Payer: Self-pay | Admitting: Family Medicine

## 2024-05-20 DIAGNOSIS — F331 Major depressive disorder, recurrent, moderate: Secondary | ICD-10-CM

## 2024-05-26 ENCOUNTER — Ambulatory Visit

## 2024-05-26 NOTE — Progress Notes (Cosign Needed Addendum)
 Patient is in office today for a nurse visit for Blood Pressure Check. Patient blood pressure was 128/68, Patient No chest pain, No shortness of breath, No dyspnea on exertion, No orthopnea, No paroxysmal nocturnal dyspnea, No edema, No palpitations, No syncope.  Patient stated she had concerns about having heaviness in her chest and hearing her heartbeat while doing physical therapy.  Per Dr Sherre. Follow up with Ginnie this week and hold off on PT until after follow up with Ginnie.

## 2024-05-27 ENCOUNTER — Other Ambulatory Visit: Payer: Self-pay | Admitting: Physician Assistant

## 2024-05-27 ENCOUNTER — Encounter: Payer: Self-pay | Admitting: Physician Assistant

## 2024-05-27 DIAGNOSIS — I10 Essential (primary) hypertension: Secondary | ICD-10-CM

## 2024-06-02 ENCOUNTER — Ambulatory Visit: Admitting: Physician Assistant

## 2024-06-04 DIAGNOSIS — M545 Low back pain, unspecified: Secondary | ICD-10-CM | POA: Insufficient documentation

## 2024-06-06 DIAGNOSIS — R002 Palpitations: Secondary | ICD-10-CM | POA: Insufficient documentation

## 2024-06-06 DIAGNOSIS — R072 Precordial pain: Secondary | ICD-10-CM | POA: Insufficient documentation

## 2024-06-12 ENCOUNTER — Encounter: Payer: Self-pay | Admitting: Gastroenterology

## 2024-07-01 ENCOUNTER — Other Ambulatory Visit: Payer: Self-pay | Admitting: Physician Assistant

## 2024-07-01 DIAGNOSIS — F419 Anxiety disorder, unspecified: Secondary | ICD-10-CM

## 2024-07-28 ENCOUNTER — Ambulatory Visit (INDEPENDENT_AMBULATORY_CARE_PROVIDER_SITE_OTHER)

## 2024-07-28 DIAGNOSIS — Z23 Encounter for immunization: Secondary | ICD-10-CM

## 2024-07-28 NOTE — Progress Notes (Signed)
 Patient came in today for nurse visit for Flu Vaccine. Patient was given FLUBLOK in the Right deltoid. Patient tolerated vaccine well   Patient was given covid booster in left deltoid. Patient tolerated vaccine well.

## 2024-07-31 ENCOUNTER — Other Ambulatory Visit: Payer: Self-pay | Admitting: Gastroenterology

## 2024-08-19 ENCOUNTER — Encounter: Payer: Self-pay | Admitting: Physician Assistant

## 2024-08-19 ENCOUNTER — Ambulatory Visit (INDEPENDENT_AMBULATORY_CARE_PROVIDER_SITE_OTHER): Admitting: Physician Assistant

## 2024-08-19 VITALS — BP 118/76 | HR 97 | Temp 97.9°F | Ht 62.0 in | Wt 172.2 lb

## 2024-08-19 DIAGNOSIS — R42 Dizziness and giddiness: Secondary | ICD-10-CM | POA: Diagnosis not present

## 2024-08-19 DIAGNOSIS — R232 Flushing: Secondary | ICD-10-CM

## 2024-08-19 DIAGNOSIS — I471 Supraventricular tachycardia, unspecified: Secondary | ICD-10-CM | POA: Diagnosis not present

## 2024-08-19 MED ORDER — METOPROLOL SUCCINATE ER 25 MG PO TB24: 25.0000 mg | ORAL_TABLET | Freq: Every day | ORAL | 2 refills | Status: DC

## 2024-08-19 NOTE — Progress Notes (Signed)
 Acute Office Visit  Subjective:    Patient ID: Bonnie Parker, female    DOB: 01/01/1971, 53 y.o.   MRN: [id number]  Chief Complaint  Patient presents with   Hot Flashes    HPI: Patient is in today for complaints of hot flashes - states they have been happening over the past 3 weeks.  They mostly occur with activity.  She also has intermittent dizziness that has been occurring with activity.  She denies chest pain or edema Has had a feeling of at times not getting a good deep breath associated with the other symptoms Pt has been referred to cardiology regarding similar symptoms and had echocardiogram and event monitor done Event monitor showed attrial tach with SVT run up to 222bpm -- pt was advised that since she only had few episodes treatment would be discussed at follow up in December   Current Outpatient Medications:    acetaminophen  (TYLENOL ) 500 MG tablet, Take 1,000 mg by mouth every 6 (six) hours as needed for moderate pain or headache., Disp: , Rfl:    albuterol  (PROAIR  HFA) 108 (90 Base) MCG/ACT inhaler, Inhale 1-2 puffs into the lungs every 6 (six) hours as needed., Disp: 1 each, Rfl: 5   buPROPion  (WELLBUTRIN  XL) 300 MG 24 hr tablet, TAKE 1 TABLET BY MOUTH DAILY, Disp: 90 tablet, Rfl: 1   ezetimibe  (ZETIA ) 10 MG tablet, Take 1 tablet (10 mg total) by mouth daily., Disp: 30 tablet, Rfl: 1   fenofibrate  160 MG tablet, TAKE 1 TABLET BY MOUTH DAILY, Disp: 90 tablet, Rfl: 1   folic acid  (FOLVITE ) 1 MG tablet, Take 1 mg by mouth daily., Disp: , Rfl:    levothyroxine  (SYNTHROID ) 50 MCG tablet, TAKE 1 TABLET BY MOUTH DAILY  BEFORE BREAKFAST, Disp: 90 tablet, Rfl: 1   lisinopril  (ZESTRIL ) 10 MG tablet, TAKE 1 TABLET BY MOUTH DAILY, Disp: 90 tablet, Rfl: 0   LORazepam  (ATIVAN ) 0.5 MG tablet, TAKE 1 TABLET BY MOUTH ONCE DAILY AS NEEDED FOR ANXIETY, Disp: 30 tablet, Rfl: 0   methocarbamol  (ROBAXIN ) 750 MG tablet, TAKE 2 TABLETS BY MOUTH 3 TIMES  DAILY, Disp: 90 tablet, Rfl: 1    Methotrexate  Sodium (METHOTREXATE , PF,) 50 MG/2ML injection, , Disp: , Rfl:    metoprolol succinate (TOPROL-XL) 25 MG 24 hr tablet, Take 1 tablet (25 mg total) by mouth daily., Disp: 30 tablet, Rfl: 2   pantoprazole  (PROTONIX ) 40 MG tablet, TAKE 1 TABLET BY MOUTH TWICE  DAILY, Disp: 180 tablet, Rfl: 3   sertraline  (ZOLOFT ) 100 MG tablet, TAKE 1 TABLET BY MOUTH DAILY, Disp: 90 tablet, Rfl: 1  Allergies  Allergen Reactions   Atorvastatin  Calcium  Other (See Comments)    atorvastatin  calcium    Other Itching    blisters   Tape Other (See Comments) and Itching    blisters   Atorvastatin  Other (See Comments)    MYALGIAS, Muscle Aches  atorvastatin    Rosuvastatin Other (See Comments)    MYALGIAS, Muscle Aches  rosuvastatin   Oxycodone  Itching   Percocet [Oxycodone -Acetaminophen ] Itching and Nausea Only    ROS CONSTITUTIONAL: see HPI E/N/T: Negative for ear pain, nasal congestion and sore throat.  CARDIOVASCULAR:see HPI RESPIRATORY: Negative for recent cough and dyspnea.  GASTROINTESTINAL: Negative for abdominal pain, acid reflux symptoms, constipation, diarrhea, nausea and vomiting.  MSK: Negative for arthralgias and myalgias.  INTEGUMENTARY: Negative for rash.  PSYCHIATRIC: Negative for sleep disturbance and to question depression screen.  Negative for depression, negative for anhedonia.  Objective:    PHYSICAL EXAM:   BP 118/76   Pulse 97   Temp 97.9 F (36.6 C)   Ht 5' 2 (1.575 m)   Wt 172 lb 3.2 oz (78.1 kg)   LMP 10/09/1997 (Within Months) Comment: hysterectomy 1999  SpO2 98%   BMI 31.50 kg/m    GEN: Well nourished, well developed, in no acute distress  Cardiac: RRR; no murmurs, rubs, or gallops,no edema - Respiratory:  normal respiratory rate and pattern with no distress - normal breath sounds with no rales, rhonchi, wheezes or rubs Skin: warm and dry, no rash  Neuro:  Alert and Oriented x 3, - CN II-Xii grossly intact Psych: euthymic mood, appropriate  affect and demeanor    EKG - no acute changes Assessment & Plan:    Dizziness -     CBC with Differential/Platelet -     Comprehensive metabolic panel with GFR -     Thyroid Panel With TSH -     EKG 12-Lead  SVT (supraventricular tachycardia) -     CBC with Differential/Platelet -     Comprehensive metabolic panel with GFR -     Thyroid Panel With TSH -     EKG 12-Lead Follow up with cardiology in December as scheduled Rx Toprol XL 25mg  qd Hot flashes Labwork pending    Follow-up: Return in about 2 months (around 10/19/2024), or if symptoms worsen or fail to improve, for follow-up.  An After Visit Summary was printed and given to the patient.  CAMIE JONELLE NICHOLAUS DEVONNA [facility] [phone]

## 2024-08-20 ENCOUNTER — Ambulatory Visit: Payer: Self-pay | Admitting: Family Medicine

## 2024-08-20 LAB — CBC WITH DIFFERENTIAL/PLATELET
Basophils Absolute: 0.1 x10E3/uL (ref 0.0–0.2)
Basos: 1 %
EOS (ABSOLUTE): 0.3 x10E3/uL (ref 0.0–0.4)
Eos: 4 %
Hematocrit: 39.4 % (ref 34.0–46.6)
Hemoglobin: 12.5 g/dL (ref 11.1–15.9)
Immature Grans (Abs): 0 x10E3/uL (ref 0.0–0.1)
Immature Granulocytes: 0 %
Lymphocytes Absolute: 1.5 x10E3/uL (ref 0.7–3.1)
Lymphs: 22 %
MCH: 29.2 pg (ref 26.6–33.0)
MCHC: 31.7 g/dL (ref 31.5–35.7)
MCV: 92 fL (ref 79–97)
Monocytes Absolute: 0.6 x10E3/uL (ref 0.1–0.9)
Monocytes: 8 %
Neutrophils Absolute: 4.2 x10E3/uL (ref 1.4–7.0)
Neutrophils: 65 %
Platelets: 462 x10E3/uL — ABNORMAL HIGH (ref 150–450)
RBC: 4.28 x10E6/uL (ref 3.77–5.28)
RDW: 14 % (ref 11.7–15.4)
WBC: 6.6 x10E3/uL (ref 3.4–10.8)

## 2024-08-20 LAB — COMPREHENSIVE METABOLIC PANEL WITH GFR
ALT: 20 IU/L (ref 0–32)
AST: 21 IU/L (ref 0–40)
Albumin: 4.6 g/dL (ref 3.8–4.9)
Alkaline Phosphatase: 77 IU/L (ref 49–135)
BUN/Creatinine Ratio: 16 (ref 9–23)
BUN: 16 mg/dL (ref 6–24)
Bilirubin Total: <0.2 mg/dL (ref 0.0–1.2)
CO2: 22 mmol/L (ref 20–29)
Calcium: 9.9 mg/dL (ref 8.7–10.2)
Chloride: 104 mmol/L (ref 96–106)
Creatinine, Ser: 1.03 mg/dL — ABNORMAL HIGH (ref 0.57–1.00)
Globulin, Total: 2.2 g/dL (ref 1.5–4.5)
Glucose: 118 mg/dL — ABNORMAL HIGH (ref 70–99)
Potassium: 4.9 mmol/L (ref 3.5–5.2)
Sodium: 140 mmol/L (ref 134–144)
Total Protein: 6.8 g/dL (ref 6.0–8.5)
eGFR: 65 mL/min/1.73 (ref >59)

## 2024-08-20 LAB — THYROID PANEL WITH TSH
Free Thyroxine Index: 2 (ref 1.2–4.9)
T3 Uptake Ratio: 25 % (ref 24–39)
T4, Total: 8.1 ug/dL (ref 4.5–12.0)
TSH: 1.2 u[IU]/mL (ref 0.450–4.500)

## 2024-08-22 ENCOUNTER — Other Ambulatory Visit: Payer: Self-pay | Admitting: Physician Assistant

## 2024-08-22 DIAGNOSIS — I10 Essential (primary) hypertension: Secondary | ICD-10-CM

## 2024-09-16 ENCOUNTER — Other Ambulatory Visit: Payer: Self-pay | Admitting: Physician Assistant

## 2024-09-16 DIAGNOSIS — M545 Low back pain, unspecified: Secondary | ICD-10-CM

## 2024-09-16 DIAGNOSIS — F419 Anxiety disorder, unspecified: Secondary | ICD-10-CM

## 2024-09-18 ENCOUNTER — Inpatient Hospital Stay
Admission: RE | Admit: 2024-09-18 | Discharge: 2024-09-18 | Attending: Physician Assistant | Admitting: Physician Assistant

## 2024-09-18 DIAGNOSIS — M545 Low back pain, unspecified: Secondary | ICD-10-CM

## 2024-10-13 ENCOUNTER — Other Ambulatory Visit: Payer: Self-pay | Admitting: Orthopedic Surgery

## 2024-10-13 DIAGNOSIS — M259 Joint disorder, unspecified: Secondary | ICD-10-CM

## 2024-10-14 ENCOUNTER — Encounter: Payer: Self-pay | Admitting: Physician Assistant

## 2024-10-16 ENCOUNTER — Other Ambulatory Visit: Payer: Self-pay | Admitting: Orthopedic Surgery

## 2024-10-16 ENCOUNTER — Ambulatory Visit: Admitting: Physician Assistant

## 2024-10-16 ENCOUNTER — Encounter: Payer: Self-pay | Admitting: Physician Assistant

## 2024-10-16 DIAGNOSIS — M259 Joint disorder, unspecified: Secondary | ICD-10-CM

## 2024-10-21 ENCOUNTER — Ambulatory Visit: Admitting: Physician Assistant

## 2024-10-30 ENCOUNTER — Ambulatory Visit (INDEPENDENT_AMBULATORY_CARE_PROVIDER_SITE_OTHER): Admitting: Physician Assistant

## 2024-10-30 ENCOUNTER — Telehealth: Payer: Self-pay

## 2024-10-30 ENCOUNTER — Encounter: Payer: Self-pay | Admitting: Physician Assistant

## 2024-10-30 VITALS — BP 128/84 | HR 73 | Temp 98.0°F | Resp 18 | Ht 62.0 in | Wt 173.4 lb

## 2024-10-30 DIAGNOSIS — E785 Hyperlipidemia, unspecified: Secondary | ICD-10-CM

## 2024-10-30 DIAGNOSIS — K219 Gastro-esophageal reflux disease without esophagitis: Secondary | ICD-10-CM | POA: Diagnosis not present

## 2024-10-30 DIAGNOSIS — E038 Other specified hypothyroidism: Secondary | ICD-10-CM

## 2024-10-30 DIAGNOSIS — D869 Sarcoidosis, unspecified: Secondary | ICD-10-CM | POA: Diagnosis not present

## 2024-10-30 DIAGNOSIS — M810 Age-related osteoporosis without current pathological fracture: Secondary | ICD-10-CM

## 2024-10-30 DIAGNOSIS — I471 Supraventricular tachycardia, unspecified: Secondary | ICD-10-CM

## 2024-10-30 DIAGNOSIS — I10 Essential (primary) hypertension: Secondary | ICD-10-CM | POA: Diagnosis not present

## 2024-10-30 DIAGNOSIS — F331 Major depressive disorder, recurrent, moderate: Secondary | ICD-10-CM

## 2024-10-30 DIAGNOSIS — F419 Anxiety disorder, unspecified: Secondary | ICD-10-CM | POA: Diagnosis not present

## 2024-10-30 DIAGNOSIS — D508 Other iron deficiency anemias: Secondary | ICD-10-CM | POA: Diagnosis not present

## 2024-10-30 DIAGNOSIS — Z1231 Encounter for screening mammogram for malignant neoplasm of breast: Secondary | ICD-10-CM | POA: Diagnosis not present

## 2024-10-30 NOTE — Telephone Encounter (Signed)
 Called for allergy clarification. Left message on VM

## 2024-10-30 NOTE — Progress Notes (Signed)
 "  Established Patient Office Visit  Subjective:  Patient ID: Bonnie Parker, female    DOB: [birth year]  Age: 54 y.o. MRN: [id number]  CC:  Chief Complaint  Patient presents with   Hyperlipidemia   Hypertension           HPI Bonnie Parker presents for hyperlipidemia  Mixed hyperlipidemia  Pt presents with hyperlipidemia. . Compliance with treatment has been good The patient is compliant with medications, maintains a low cholesterol diet , follows up as directed , and maintains an exercise regimen . The patient denies experiencing any hypercholesterolemia related symptoms. Pt currently on Zetia  10mg  qd and fenofibrate  160mg   Pt with history of sarcoidosis - she sees specialist Dr Briant in [place] and also eye specialist Dr Maree - currently on methotrexate weekly for management - states he advised her to come off fosamax  until her next bone density test She is due for dexa scan and will schedule Pt also missed mammogram appt - will schedule  Pt with history of anxiety and depression - she is currently taking zoloft  100mg  qd and wellburtin XL 300mg  qd - states symptoms are stable at this time.  She uses ativan  as needed   Pt states she is no longer taking vit D supplements - she states rheumatologist told her to stop  Pt with history of hypothyroidism- is taking 50mcg qd - voices no problems or concerns Is due to check TSH  Pt with history of GERD - she is currently on protonix  40mg  bid - had normal colonoscopy and did have endoscopy that showed nonbleeding ulcer - has been advised to stop any NSAIDs-   Pt presents for follow up of hypertension. The patient is tolerating the medication well without side effects. Compliance with treatment has been good; including taking medication as directed , maintains a healthy diet and regular exercise regimen , and following up as directed. Currently taking zestril  10mg  qd She also has SVT and was started on Toprol  XL 25mg  qd - she  states that has really helped with her symptoms and rarely has problems with abnormal heart rhythm now  Pt with chronic low back pain and diagnosis of DJD - has implanted pain stimulator Currently following with ortho - uses robaxin  as needed Past Medical History:  Diagnosis Date   Age-related osteoporosis without current pathological fracture    Allergy    Anxiety    Arthritis    Asthma    Atrophy of thyroid  (acquired)    Chronic kidney disease    Depression    Drug-induced myopathy    Gastro-esophageal reflux disease without esophagitis    Hypertension    Hypothyroidism    Lumbar spinal stenosis    Mixed hyperlipidemia    Polyarthralgia    Sarcoidosis of other sites     Past Surgical History:  Procedure Laterality Date   ABDOMINAL HYSTERECTOMY  1999   Total   APPENDECTOMY     BACK SURGERY  2005   Dr. Joshua GRIST EAR SURGERY     LUMBAR LAMINECTOMY/DECOMPRESSION MICRODISCECTOMY N/A 06/21/2017   Procedure: Lumbar decompression L3-4 ;  Surgeon: Burnetta Aures, MD;  Location: [facility] OR;  Service: Orthopedics;  Laterality: N/A;  3 hrs   SPINAL CORD STIMULATOR INSERTION N/A 03/13/2019   Procedure: LUMBAR SPINAL CORD STIMULATOR INSERTION;  Surgeon: Burnetta Aures, MD;  Location: [facility] OR;  Service: Orthopedics;  Laterality: N/A;   TONSILLECTOMY      Family History  Problem Relation Age of Onset  Hypertension Mother    Cancer - Lung Father    Colon cancer Neg Hx    Esophageal cancer Neg Hx    Rectal cancer Neg Hx    Stomach cancer Neg Hx    Inflammatory bowel disease Neg Hx    Liver disease Neg Hx    Pancreatic cancer Neg Hx     Social History   Socioeconomic History   Marital status: Married    Spouse name: Not on file   Number of children: 4   Years of education: Not on file   Highest education level: Not on file  Occupational History   Not on file  Tobacco Use   Smoking status: Never   Smokeless tobacco: Never  Vaping Use   Vaping status: Never Used   Substance and Sexual Activity   Alcohol use: No   Drug use: No   Sexual activity: Not on file  Other Topics Concern   Not on file  Social History Narrative   Not on file   Social Drivers of Health   Tobacco Use: Low Risk (10/30/2024)   Patient History    Smoking Tobacco Use: Never    Smokeless Tobacco Use: Never    Passive Exposure: Not on file  Financial Resource Strain: Low Risk (04/24/2023)   Overall Financial Resource Strain (CARDIA)    Difficulty of Paying Living Expenses: Not hard at all  Food Insecurity: No Food Insecurity (04/24/2023)   Hunger Vital Sign    Worried About Running Out of Food in the Last Year: Never true    Ran Out of Food in the Last Year: Never true  Transportation Needs: No Transportation Needs (04/24/2023)   PRAPARE - Administrator, Civil Service (Medical): No    Lack of Transportation (Non-Medical): No  Physical Activity: Inactive (04/24/2023)   Exercise Vital Sign    Days of Exercise per Week: 0 days    Minutes of Exercise per Session: 0 min  Stress: No Stress Concern Present (04/24/2023)   [employer] of Occupational Health - Occupational Stress Questionnaire    Feeling of Stress : Not at all  Social Connections: Moderately Isolated (04/24/2023)   Social Connection and Isolation Panel    Frequency of Communication with Friends and Family: More than three times a week    Frequency of Social Gatherings with Friends and Family: More than three times a week    Attends Religious Services: Never    Database Administrator or Organizations: No    Attends Banker Meetings: Never    Marital Status: Married  Catering Manager Violence: Not At Risk (04/24/2023)   Humiliation, Afraid, Rape, and Kick questionnaire    Fear of Current or Ex-Partner: No    Emotionally Abused: No    Physically Abused: No    Sexually Abused: No  Depression (PHQ2-9): Low Risk (10/30/2024)   Depression (PHQ2-9)    PHQ-2 Score: 0  Alcohol Screen: Low  Risk (04/24/2023)   Alcohol Screen    Last Alcohol Screening Score (AUDIT): 0  Housing: Low Risk (04/24/2023)   Housing    Last Housing Risk Score: 0  Utilities: Not At Risk (04/24/2023)   AHC Utilities    Threatened with loss of utilities: No  Health Literacy: Adequate Health Literacy (04/24/2023)   B1300 Health Literacy    Frequency of need for help with medical instructions: Never     Current Outpatient Medications:    acetaminophen  (TYLENOL ) 500 MG tablet, Take 1,000 mg  by mouth every 6 (six) hours as needed for moderate pain or headache., Disp: , Rfl:    albuterol  (PROAIR  HFA) 108 (90 Base) MCG/ACT inhaler, Inhale 1-2 puffs into the lungs every 6 (six) hours as needed., Disp: 1 each, Rfl: 5   buPROPion  (WELLBUTRIN  XL) 300 MG 24 hr tablet, TAKE 1 TABLET BY MOUTH DAILY, Disp: 90 tablet, Rfl: 1   ezetimibe  (ZETIA ) 10 MG tablet, Take 1 tablet (10 mg total) by mouth daily., Disp: 30 tablet, Rfl: 1   fenofibrate  160 MG tablet, TAKE 1 TABLET BY MOUTH DAILY, Disp: 90 tablet, Rfl: 1   folic acid (FOLVITE) 1 MG tablet, Take 1 mg by mouth daily., Disp: , Rfl:    levothyroxine  (SYNTHROID ) 50 MCG tablet, TAKE 1 TABLET BY MOUTH DAILY  BEFORE BREAKFAST, Disp: 90 tablet, Rfl: 1   lisinopril  (ZESTRIL ) 10 MG tablet, TAKE 1 TABLET BY MOUTH DAILY, Disp: 90 tablet, Rfl: 1   LORazepam  (ATIVAN ) 0.5 MG tablet, TAKE 1 TABLET BY MOUTH ONCE DAILY AS NEEDED FOR ANXIETY, Disp: 30 tablet, Rfl: 0   methocarbamol  (ROBAXIN ) 750 MG tablet, TAKE 2 TABLETS BY MOUTH 3 TIMES  DAILY, Disp: 90 tablet, Rfl: 1   Methotrexate Sodium (METHOTREXATE, PF,) 50 MG/2ML injection, , Disp: , Rfl:    metoprolol  succinate (TOPROL -XL) 25 MG 24 hr tablet, Take 1 tablet (25 mg total) by mouth daily., Disp: 30 tablet, Rfl: 2   pantoprazole  (PROTONIX ) 40 MG tablet, TAKE 1 TABLET BY MOUTH TWICE  DAILY, Disp: 180 tablet, Rfl: 3   sertraline  (ZOLOFT ) 100 MG tablet, TAKE 1 TABLET BY MOUTH DAILY, Disp: 90 tablet, Rfl: 1   Allergies  Allergen  Reactions   Atorvastatin  Calcium  Other (See Comments)    atorvastatin  calcium    Other Itching    blisters   Tape Other (See Comments) and Itching    blisters   Atorvastatin  Other (See Comments)    MYALGIAS, Muscle Aches  atorvastatin    Rosuvastatin Other (See Comments)    MYALGIAS, Muscle Aches  rosuvastatin   Oxycodone  Itching   Percocet [Oxycodone -Acetaminophen ] Itching and Nausea Only   CONSTITUTIONAL: Negative for chills, fatigue, fever, E/N/T: Negative for ear pain, nasal congestion and sore throat.  CARDIOVASCULAR: Negative for chest pain, dizziness, palpitations and pedal edema.  RESPIRATORY: Negative for recent cough and dyspnea.  GASTROINTESTINAL: Negative for abdominal pain, acid reflux symptoms, constipation, diarrhea, nausea and vomiting.  MSK: Negative for arthralgias and myalgias.  INTEGUMENTARY: Negative for rash.  NEUROLOGICAL: Negative for dizziness and headaches.  PSYCHIATRIC: Negative for sleep disturbance and to question depression screen.  Negative for depression, negative for anhedonia.       Objective:  PHYSICAL EXAM:   VS: BP 128/84   Pulse 73   Temp 98 F (36.7 C) (Temporal)   Resp 18   Ht 5' 2 (1.575 m)   Wt 173 lb 6.4 oz (78.7 kg)   LMP 10/09/1997 Comment: hysterectomy 1999  SpO2 99%   BMI 31.72 kg/m   GEN: Well nourished, well developed, in no acute distress  Cardiac: RRR; no murmurs, rubs, or gallops,no edema - Respiratory:  normal respiratory rate and pattern with no distress - normal breath sounds with no rales, rhonchi, wheezes or rubs MS: no deformity or atrophy  Skin: warm and dry, no rash  Neuro:  Alert and Oriented x 3, - CN II-Xii grossly intact Psych: euthymic mood, appropriate affect and demeanor      10/30/2024    8:32 AM 08/19/2024    2:25 PM  04/25/2024    8:32 AM 10/26/2023    9:59 AM 04/24/2023   10:35 AM  Depression screen PHQ 2/9  Decreased Interest 0 0 0 0 0  Down, Depressed, Hopeless 0 0 0 0 1  PHQ - 2 Score 0  0 0 0 1  Altered sleeping  0 1 0 1  Tired, decreased energy  1 1 0 1  Change in appetite  0 0 0 0  Feeling bad or failure about yourself   0 0 0 0  Trouble concentrating  0 0 0 0  Moving slowly or fidgety/restless  0 0 0 0  Suicidal thoughts  0 0 0 0  PHQ-9 Score  1 2  0  3   Difficult doing work/chores  Not difficult at all Not difficult at all Not difficult at all Somewhat difficult     Data saved with a previous flowsheet row definition      No visits with results within 1 Day(s) from this visit.  Latest known visit with results is:  Office Visit on 08/19/2024  Component Date Value Ref Range Status   WBC 08/19/2024 6.6  3.4 - 10.8 x10E3/uL Final   RBC 08/19/2024 4.28  3.77 - 5.28 x10E6/uL Final   Hemoglobin 08/19/2024 12.5  11.1 - 15.9 g/dL Final   Hematocrit 88/88/7974 39.4  34.0 - 46.6 % Final   MCV 08/19/2024 92  79 - 97 fL Final   MCH 08/19/2024 29.2  26.6 - 33.0 pg Final   MCHC 08/19/2024 31.7  31.5 - 35.7 g/dL Final   RDW 88/88/7974 14.0  11.7 - 15.4 % Final   Platelets 08/19/2024 462 (H)  150 - 450 x10E3/uL Final   Neutrophils 08/19/2024 65  Not Estab. % Final   Lymphs 08/19/2024 22  Not Estab. % Final   Monocytes 08/19/2024 8  Not Estab. % Final   Eos 08/19/2024 4  Not Estab. % Final   Basos 08/19/2024 1  Not Estab. % Final   Neutrophils Absolute 08/19/2024 4.2  1.4 - 7.0 x10E3/uL Final   Lymphocytes Absolute 08/19/2024 1.5  0.7 - 3.1 x10E3/uL Final   Monocytes Absolute 08/19/2024 0.6  0.1 - 0.9 x10E3/uL Final   EOS (ABSOLUTE) 08/19/2024 0.3  0.0 - 0.4 x10E3/uL Final   Basophils Absolute 08/19/2024 0.1  0.0 - 0.2 x10E3/uL Final   Immature Granulocytes 08/19/2024 0  Not Estab. % Final   Immature Grans (Abs) 08/19/2024 0.0  0.0 - 0.1 x10E3/uL Final   Glucose 08/19/2024 118 (H)  70 - 99 mg/dL Final   BUN 88/88/7974 16  6 - 24 mg/dL Final   Creatinine, Ser 08/19/2024 1.03 (H)  0.57 - 1.00 mg/dL Final   eGFR 88/88/7974 65  >59 mL/min/1.73 Final   BUN/Creatinine  Ratio 08/19/2024 16  9 - 23 Final   Sodium 08/19/2024 140  134 - 144 mmol/L Final   Potassium 08/19/2024 4.9  3.5 - 5.2 mmol/L Final   Chloride 08/19/2024 104  96 - 106 mmol/L Final   CO2 08/19/2024 22  20 - 29 mmol/L Final   Calcium  08/19/2024 9.9  8.7 - 10.2 mg/dL Final   Total Protein 88/88/7974 6.8  6.0 - 8.5 g/dL Final   Albumin 88/88/7974 4.6  3.8 - 4.9 g/dL Final   Globulin, Total 08/19/2024 2.2  1.5 - 4.5 g/dL Final   Bilirubin Total 08/19/2024 <0.2  0.0 - 1.2 mg/dL Final   Alkaline Phosphatase 08/19/2024 77  49 - 135 IU/L Final   AST 08/19/2024  21  0 - 40 IU/L Final   ALT 08/19/2024 20  0 - 32 IU/L Final   TSH 08/19/2024 1.200  0.450 - 4.500 uIU/mL Final   T4, Total 08/19/2024 8.1  4.5 - 12.0 ug/dL Final   T3 Uptake Ratio 08/19/2024 25  24 - 39 % Final   Free Thyroxine Index 08/19/2024 2.0  1.2 - 4.9 Final    Health Maintenance Due  Topic Date Due   Mammogram  11/09/2023   Bone Density Scan  11/10/2024     There are no preventive care reminders to display for this patient.  Lab Results  Component Value Date   TSH 1.200 08/19/2024   Lab Results  Component Value Date   WBC 6.6 08/19/2024   HGB 12.5 08/19/2024   HCT 39.4 08/19/2024   MCV 92 08/19/2024   PLT 462 (H) 08/19/2024   Lab Results  Component Value Date   NA 140 08/19/2024   K 4.9 08/19/2024   CO2 22 08/19/2024   GLUCOSE 118 (H) 08/19/2024   BUN 16 08/19/2024   CREATININE 1.03 (H) 08/19/2024   BILITOT <0.2 08/19/2024   ALKPHOS 77 08/19/2024   AST 21 08/19/2024   ALT 20 08/19/2024   PROT 6.8 08/19/2024   ALBUMIN 4.6 08/19/2024   CALCIUM  9.9 08/19/2024   ANIONGAP 7 03/11/2019   EGFR 65 08/19/2024   GFR 74.95 07/18/2023   Lab Results  Component Value Date   CHOL 148 04/25/2024   Lab Results  Component Value Date   HDL 45 04/25/2024   Lab Results  Component Value Date   LDLCALC 82 04/25/2024   Lab Results  Component Value Date   TRIG 117 04/25/2024   Lab Results  Component Value  Date   CHOLHDL 3.3 04/25/2024   No results found for: HGBA1C    Assessment & Plan:   Problem List Items Addressed This Visit       Cardiovascular and Mediastinum   Benign hypertension - Primary   Relevant Orders   CBC with Differential/Platelet   Comprehensive metabolic panel Continue current meds as directed  SVT  Continue metoprolol      GERD - uncontrolled   Continue protonix  40mg  bid           Endocrine   Other specified hypothyroidism   Relevant Orders   TSH Continue med     Musculoskeletal and Integument   Osteoporosis   Relevant Orders   VITAMIN D  25 Hydroxy (Vit-D Deficiency, Fractures) Continue fosamax  Dexa scan ordered     Other   Dyslipidemia   Relevant Orders   Lipid panel Continue meds  Anxiety and depression Continue current meds  Sarcoidosis Continue meds and follow up with specialist as directed  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   Follow-up: Return in about 6 months (around 04/29/2025) for chronic fasting follow-up.    SARA R Kalel Harty, PA-C "

## 2024-10-30 NOTE — Patient Instructions (Signed)
 If I have ordered a referral, lab work, or a test, please watch for messages/letters in your [place]. Please be aware of unknown numbers, as this may be a specialist's office attempting to call and schedule your appointment. You may wish to enter the specialist's phone number in your contacts, so your phone will not block the calls as SPAM. If you have NOT been contacted with in 2 weeks: Please call the specialist's office  Call [facility].

## 2024-10-31 ENCOUNTER — Ambulatory Visit
Admission: RE | Admit: 2024-10-31 | Discharge: 2024-10-31 | Disposition: A | Source: Ambulatory Visit | Attending: Orthopedic Surgery | Admitting: Orthopedic Surgery

## 2024-10-31 DIAGNOSIS — M259 Joint disorder, unspecified: Secondary | ICD-10-CM

## 2024-10-31 LAB — COMPREHENSIVE METABOLIC PANEL WITH GFR
ALT: 21 IU/L (ref 0–32)
AST: 19 IU/L (ref 0–40)
Albumin: 4.5 g/dL (ref 3.8–4.9)
Alkaline Phosphatase: 64 IU/L (ref 49–135)
BUN/Creatinine Ratio: 21 (ref 9–23)
BUN: 20 mg/dL (ref 6–24)
Bilirubin Total: 0.3 mg/dL (ref 0.0–1.2)
CO2: 22 mmol/L (ref 20–29)
Calcium: 9.4 mg/dL (ref 8.7–10.2)
Chloride: 105 mmol/L (ref 96–106)
Creatinine, Ser: 0.94 mg/dL (ref 0.57–1.00)
Globulin, Total: 2.2 g/dL (ref 1.5–4.5)
Glucose: 90 mg/dL (ref 70–99)
Potassium: 4.6 mmol/L (ref 3.5–5.2)
Sodium: 141 mmol/L (ref 134–144)
Total Protein: 6.7 g/dL (ref 6.0–8.5)
eGFR: 72 mL/min/1.73

## 2024-10-31 LAB — FE+CBC/D/PLT+TIBC+FER+RETIC
Basophils Absolute: 0.1 x10E3/uL (ref 0.0–0.2)
Basos: 1 %
EOS (ABSOLUTE): 0.2 x10E3/uL (ref 0.0–0.4)
Eos: 5 %
Ferritin: 141 ng/mL (ref 15–150)
Hematocrit: 36.9 % (ref 34.0–46.6)
Hemoglobin: 11.6 g/dL (ref 11.1–15.9)
Immature Grans (Abs): 0 x10E3/uL (ref 0.0–0.1)
Immature Granulocytes: 0 %
Iron Saturation: 19 % (ref 15–55)
Iron: 78 ug/dL (ref 27–159)
Lymphocytes Absolute: 1.6 x10E3/uL (ref 0.7–3.1)
Lymphs: 32 %
MCH: 30.2 pg (ref 26.6–33.0)
MCHC: 31.4 g/dL — ABNORMAL LOW (ref 31.5–35.7)
MCV: 96 fL (ref 79–97)
Monocytes Absolute: 0.5 x10E3/uL (ref 0.1–0.9)
Monocytes: 11 %
Neutrophils Absolute: 2.6 x10E3/uL (ref 1.4–7.0)
Neutrophils: 51 %
Platelets: 379 x10E3/uL (ref 150–450)
RBC: 3.84 x10E6/uL (ref 3.77–5.28)
RDW: 14.6 % (ref 11.7–15.4)
Retic Ct Pct: 1.6 % (ref 0.6–2.6)
Total Iron Binding Capacity: 409 ug/dL (ref 250–450)
UIBC: 331 ug/dL (ref 131–425)
WBC: 5.1 x10E3/uL (ref 3.4–10.8)

## 2024-10-31 LAB — LIPID PANEL
Chol/HDL Ratio: 4.2 ratio (ref 0.0–4.4)
Cholesterol, Total: 163 mg/dL (ref 100–199)
HDL: 39 mg/dL — ABNORMAL LOW
LDL Chol Calc (NIH): 92 mg/dL (ref 0–99)
Triglycerides: 186 mg/dL — ABNORMAL HIGH (ref 0–149)
VLDL Cholesterol Cal: 32 mg/dL (ref 5–40)

## 2024-10-31 LAB — VITAMIN D 25 HYDROXY (VIT D DEFICIENCY, FRACTURES): Vit D, 25-Hydroxy: 30 ng/mL (ref 30.0–100.0)

## 2024-10-31 LAB — TSH: TSH: 1.72 u[IU]/mL (ref 0.450–4.500)

## 2024-11-03 ENCOUNTER — Ambulatory Visit: Payer: Self-pay | Admitting: Family Medicine

## 2024-11-08 ENCOUNTER — Other Ambulatory Visit: Payer: Self-pay | Admitting: Physician Assistant

## 2024-11-08 ENCOUNTER — Other Ambulatory Visit: Payer: Self-pay | Admitting: Family Medicine

## 2024-11-08 DIAGNOSIS — M25551 Pain in right hip: Secondary | ICD-10-CM

## 2024-11-08 DIAGNOSIS — F331 Major depressive disorder, recurrent, moderate: Secondary | ICD-10-CM

## 2024-11-11 ENCOUNTER — Other Ambulatory Visit: Payer: Self-pay

## 2024-11-11 DIAGNOSIS — F419 Anxiety disorder, unspecified: Secondary | ICD-10-CM

## 2024-11-11 DIAGNOSIS — I471 Supraventricular tachycardia, unspecified: Secondary | ICD-10-CM

## 2024-11-11 MED ORDER — LORAZEPAM 0.5 MG PO TABS: 0.5000 mg | ORAL_TABLET | Freq: Every day | ORAL | 1 refills | Status: AC | PRN

## 2024-11-11 MED ORDER — METOPROLOL SUCCINATE ER 25 MG PO TB24: 25.0000 mg | ORAL_TABLET | Freq: Every day | ORAL | 1 refills | Status: AC

## 2024-11-12 ENCOUNTER — Other Ambulatory Visit (HOSPITAL_BASED_OUTPATIENT_CLINIC_OR_DEPARTMENT_OTHER): Admitting: Radiology

## 2024-11-12 ENCOUNTER — Ambulatory Visit (HOSPITAL_BASED_OUTPATIENT_CLINIC_OR_DEPARTMENT_OTHER): Admitting: Radiology

## 2024-11-18 ENCOUNTER — Other Ambulatory Visit (HOSPITAL_BASED_OUTPATIENT_CLINIC_OR_DEPARTMENT_OTHER): Admitting: Radiology

## 2024-11-18 ENCOUNTER — Ambulatory Visit (HOSPITAL_BASED_OUTPATIENT_CLINIC_OR_DEPARTMENT_OTHER): Admitting: Radiology

## 2025-05-08 ENCOUNTER — Ambulatory Visit: Admitting: Physician Assistant
